# Patient Record
Sex: Female | Born: 1965 | Race: Black or African American | Hispanic: No | Marital: Married | Smoking: Never smoker
Health system: Southern US, Community
[De-identification: ages and names within clinical notes are randomized; demographics above are authoritative.]

## PROBLEM LIST (undated history)

## (undated) DIAGNOSIS — K449 Diaphragmatic hernia without obstruction or gangrene: Secondary | ICD-10-CM

## (undated) DIAGNOSIS — I729 Aneurysm of unspecified site: Secondary | ICD-10-CM

## (undated) DIAGNOSIS — I10 Essential (primary) hypertension: Secondary | ICD-10-CM

## (undated) DIAGNOSIS — G4730 Sleep apnea, unspecified: Secondary | ICD-10-CM

## (undated) DIAGNOSIS — K219 Gastro-esophageal reflux disease without esophagitis: Secondary | ICD-10-CM

## (undated) DIAGNOSIS — B977 Papillomavirus as the cause of diseases classified elsewhere: Secondary | ICD-10-CM

## (undated) DIAGNOSIS — N200 Calculus of kidney: Secondary | ICD-10-CM

## (undated) DIAGNOSIS — K589 Irritable bowel syndrome without diarrhea: Secondary | ICD-10-CM

## (undated) DIAGNOSIS — R7611 Nonspecific reaction to tuberculin skin test without active tuberculosis: Secondary | ICD-10-CM

## (undated) DIAGNOSIS — D649 Anemia, unspecified: Secondary | ICD-10-CM

## (undated) DIAGNOSIS — F419 Anxiety disorder, unspecified: Secondary | ICD-10-CM

## (undated) DIAGNOSIS — D573 Sickle-cell trait: Secondary | ICD-10-CM

## (undated) HISTORY — DX: Anxiety disorder, unspecified: F41.9

## (undated) HISTORY — DX: Gastro-esophageal reflux disease without esophagitis: K21.9

## (undated) HISTORY — DX: Irritable bowel syndrome, unspecified: K58.9

## (undated) HISTORY — DX: Nonspecific reaction to tuberculin skin test without active tuberculosis: R76.11

## (undated) HISTORY — DX: Calculus of kidney: N20.0

## (undated) HISTORY — PX: TUBAL LIGATION: SHX77

## (undated) HISTORY — DX: Papillomavirus as the cause of diseases classified elsewhere: B97.7

## (undated) HISTORY — DX: Essential (primary) hypertension: I10

## (undated) HISTORY — DX: Sleep apnea, unspecified: G47.30

## (undated) HISTORY — PX: PELVIC LAPAROSCOPY: SHX162

## (undated) HISTORY — DX: Aneurysm of unspecified site: I72.9

## (undated) HISTORY — DX: Anemia, unspecified: D64.9

## (undated) HISTORY — PX: BREAST SURGERY: SHX581

## (undated) HISTORY — PX: OTHER SURGICAL HISTORY: SHX169

## (undated) HISTORY — PX: APPENDECTOMY: SHX54

## (undated) HISTORY — DX: Sickle-cell trait: D57.3

## (undated) HISTORY — PX: TONSILLECTOMY AND ADENOIDECTOMY: SUR1326

---

## 1998-11-14 ENCOUNTER — Emergency Department (HOSPITAL_COMMUNITY): Admission: EM | Admit: 1998-11-14 | Discharge: 1998-11-14 | Payer: Self-pay | Admitting: Emergency Medicine

## 1999-06-18 ENCOUNTER — Other Ambulatory Visit: Admission: RE | Admit: 1999-06-18 | Discharge: 1999-06-18 | Payer: Self-pay | Admitting: *Deleted

## 1999-07-09 ENCOUNTER — Encounter (INDEPENDENT_AMBULATORY_CARE_PROVIDER_SITE_OTHER): Payer: Self-pay | Admitting: Specialist

## 1999-07-09 ENCOUNTER — Other Ambulatory Visit: Admission: RE | Admit: 1999-07-09 | Discharge: 1999-07-09 | Payer: Self-pay | Admitting: *Deleted

## 1999-08-07 ENCOUNTER — Emergency Department (HOSPITAL_COMMUNITY): Admission: EM | Admit: 1999-08-07 | Discharge: 1999-08-07 | Payer: Self-pay | Admitting: Emergency Medicine

## 1999-08-07 ENCOUNTER — Encounter: Payer: Self-pay | Admitting: Emergency Medicine

## 1999-08-13 ENCOUNTER — Ambulatory Visit (HOSPITAL_COMMUNITY): Admission: RE | Admit: 1999-08-13 | Discharge: 1999-08-13 | Payer: Self-pay | Admitting: *Deleted

## 1999-08-13 ENCOUNTER — Encounter (INDEPENDENT_AMBULATORY_CARE_PROVIDER_SITE_OTHER): Payer: Self-pay

## 1999-08-14 ENCOUNTER — Inpatient Hospital Stay (HOSPITAL_COMMUNITY): Admission: AD | Admit: 1999-08-14 | Discharge: 1999-08-14 | Payer: Self-pay | Admitting: Obstetrics and Gynecology

## 1999-12-30 ENCOUNTER — Emergency Department (HOSPITAL_COMMUNITY): Admission: EM | Admit: 1999-12-30 | Discharge: 1999-12-31 | Payer: Self-pay | Admitting: Emergency Medicine

## 1999-12-31 ENCOUNTER — Ambulatory Visit (HOSPITAL_COMMUNITY): Admission: RE | Admit: 1999-12-31 | Discharge: 1999-12-31 | Payer: Self-pay | Admitting: Gastroenterology

## 2000-01-17 ENCOUNTER — Other Ambulatory Visit: Admission: RE | Admit: 2000-01-17 | Discharge: 2000-01-17 | Payer: Self-pay | Admitting: *Deleted

## 2000-06-12 ENCOUNTER — Other Ambulatory Visit: Admission: RE | Admit: 2000-06-12 | Discharge: 2000-06-12 | Payer: Self-pay | Admitting: *Deleted

## 2000-12-29 ENCOUNTER — Other Ambulatory Visit: Admission: RE | Admit: 2000-12-29 | Discharge: 2000-12-29 | Payer: Self-pay | Admitting: *Deleted

## 2001-06-20 ENCOUNTER — Other Ambulatory Visit: Admission: RE | Admit: 2001-06-20 | Discharge: 2001-06-20 | Payer: Self-pay | Admitting: *Deleted

## 2001-09-28 ENCOUNTER — Inpatient Hospital Stay (HOSPITAL_COMMUNITY): Admission: AD | Admit: 2001-09-28 | Discharge: 2001-09-28 | Payer: Self-pay | Admitting: *Deleted

## 2001-11-14 ENCOUNTER — Observation Stay (HOSPITAL_COMMUNITY): Admission: AD | Admit: 2001-11-14 | Discharge: 2001-11-14 | Payer: Self-pay | Admitting: *Deleted

## 2001-11-14 ENCOUNTER — Encounter: Payer: Self-pay | Admitting: *Deleted

## 2001-11-26 ENCOUNTER — Inpatient Hospital Stay (HOSPITAL_COMMUNITY): Admission: AD | Admit: 2001-11-26 | Discharge: 2001-11-26 | Payer: Self-pay | Admitting: *Deleted

## 2001-11-26 ENCOUNTER — Encounter: Payer: Self-pay | Admitting: *Deleted

## 2001-12-05 ENCOUNTER — Encounter: Payer: Self-pay | Admitting: *Deleted

## 2001-12-05 ENCOUNTER — Ambulatory Visit (HOSPITAL_COMMUNITY): Admission: AD | Admit: 2001-12-05 | Discharge: 2001-12-05 | Payer: Self-pay | Admitting: *Deleted

## 2001-12-13 ENCOUNTER — Inpatient Hospital Stay (HOSPITAL_COMMUNITY): Admission: AD | Admit: 2001-12-13 | Discharge: 2001-12-13 | Payer: Self-pay | Admitting: *Deleted

## 2001-12-14 ENCOUNTER — Inpatient Hospital Stay (HOSPITAL_COMMUNITY): Admission: AD | Admit: 2001-12-14 | Discharge: 2001-12-14 | Payer: Self-pay | Admitting: *Deleted

## 2001-12-24 ENCOUNTER — Inpatient Hospital Stay (HOSPITAL_COMMUNITY): Admission: AD | Admit: 2001-12-24 | Discharge: 2001-12-24 | Payer: Self-pay | Admitting: *Deleted

## 2002-01-03 ENCOUNTER — Encounter: Payer: Self-pay | Admitting: *Deleted

## 2002-01-03 ENCOUNTER — Ambulatory Visit (HOSPITAL_COMMUNITY): Admission: RE | Admit: 2002-01-03 | Discharge: 2002-01-03 | Payer: Self-pay | Admitting: *Deleted

## 2002-01-04 ENCOUNTER — Inpatient Hospital Stay (HOSPITAL_COMMUNITY): Admission: AD | Admit: 2002-01-04 | Discharge: 2002-01-07 | Payer: Self-pay | Admitting: *Deleted

## 2002-01-04 ENCOUNTER — Encounter (INDEPENDENT_AMBULATORY_CARE_PROVIDER_SITE_OTHER): Payer: Self-pay | Admitting: Specialist

## 2002-02-18 ENCOUNTER — Other Ambulatory Visit: Admission: RE | Admit: 2002-02-18 | Discharge: 2002-02-18 | Payer: Self-pay | Admitting: *Deleted

## 2003-04-22 ENCOUNTER — Other Ambulatory Visit: Admission: RE | Admit: 2003-04-22 | Discharge: 2003-04-22 | Payer: Self-pay | Admitting: Family Medicine

## 2003-08-08 ENCOUNTER — Encounter: Admission: RE | Admit: 2003-08-08 | Discharge: 2003-08-08 | Payer: Self-pay | Admitting: Family Medicine

## 2004-04-01 ENCOUNTER — Ambulatory Visit: Payer: Self-pay | Admitting: Family Medicine

## 2004-12-09 ENCOUNTER — Ambulatory Visit: Payer: Self-pay | Admitting: Family Medicine

## 2004-12-17 ENCOUNTER — Other Ambulatory Visit: Admission: RE | Admit: 2004-12-17 | Discharge: 2004-12-17 | Payer: Self-pay | Admitting: Family Medicine

## 2004-12-17 ENCOUNTER — Ambulatory Visit: Payer: Self-pay | Admitting: Family Medicine

## 2004-12-21 ENCOUNTER — Encounter: Admission: RE | Admit: 2004-12-21 | Discharge: 2004-12-21 | Payer: Self-pay | Admitting: Family Medicine

## 2005-01-06 ENCOUNTER — Encounter: Admission: RE | Admit: 2005-01-06 | Discharge: 2005-01-06 | Payer: Self-pay | Admitting: Family Medicine

## 2005-11-08 ENCOUNTER — Ambulatory Visit: Payer: Self-pay | Admitting: Internal Medicine

## 2005-11-23 ENCOUNTER — Ambulatory Visit: Payer: Self-pay | Admitting: Family Medicine

## 2006-01-24 ENCOUNTER — Ambulatory Visit: Payer: Self-pay | Admitting: Family Medicine

## 2006-01-26 ENCOUNTER — Ambulatory Visit: Payer: Self-pay

## 2006-04-04 HISTORY — PX: CYSTOSCOPY: SUR368

## 2006-06-07 ENCOUNTER — Other Ambulatory Visit: Admission: RE | Admit: 2006-06-07 | Discharge: 2006-06-07 | Payer: Self-pay | Admitting: Family Medicine

## 2006-06-07 ENCOUNTER — Encounter: Payer: Self-pay | Admitting: Family Medicine

## 2006-06-07 ENCOUNTER — Ambulatory Visit: Payer: Self-pay | Admitting: Family Medicine

## 2006-06-26 ENCOUNTER — Encounter: Admission: RE | Admit: 2006-06-26 | Discharge: 2006-06-26 | Payer: Self-pay | Admitting: *Deleted

## 2006-08-07 ENCOUNTER — Encounter (INDEPENDENT_AMBULATORY_CARE_PROVIDER_SITE_OTHER): Payer: Self-pay | Admitting: Internal Medicine

## 2006-08-07 LAB — CONVERTED CEMR LAB: Pap Smear: NORMAL

## 2006-10-04 ENCOUNTER — Ambulatory Visit: Payer: Self-pay | Admitting: Family Medicine

## 2006-10-04 LAB — CONVERTED CEMR LAB
Bilirubin Urine: NEGATIVE
Glucose, Urine, Semiquant: NEGATIVE
Ketones, urine, test strip: NEGATIVE
pH: 6

## 2006-12-12 ENCOUNTER — Emergency Department (HOSPITAL_COMMUNITY): Admission: EM | Admit: 2006-12-12 | Discharge: 2006-12-12 | Payer: Self-pay | Admitting: Emergency Medicine

## 2006-12-15 ENCOUNTER — Encounter (INDEPENDENT_AMBULATORY_CARE_PROVIDER_SITE_OTHER): Payer: Self-pay | Admitting: Internal Medicine

## 2006-12-20 ENCOUNTER — Ambulatory Visit (HOSPITAL_BASED_OUTPATIENT_CLINIC_OR_DEPARTMENT_OTHER): Admission: RE | Admit: 2006-12-20 | Discharge: 2006-12-20 | Payer: Self-pay | Admitting: Urology

## 2007-06-29 ENCOUNTER — Telehealth (INDEPENDENT_AMBULATORY_CARE_PROVIDER_SITE_OTHER): Payer: Self-pay | Admitting: Internal Medicine

## 2007-07-10 ENCOUNTER — Encounter (INDEPENDENT_AMBULATORY_CARE_PROVIDER_SITE_OTHER): Payer: Self-pay | Admitting: Internal Medicine

## 2007-07-12 ENCOUNTER — Encounter (INDEPENDENT_AMBULATORY_CARE_PROVIDER_SITE_OTHER): Payer: Self-pay | Admitting: Internal Medicine

## 2007-07-12 ENCOUNTER — Other Ambulatory Visit: Admission: RE | Admit: 2007-07-12 | Discharge: 2007-07-12 | Payer: Self-pay | Admitting: Family Medicine

## 2007-07-12 ENCOUNTER — Ambulatory Visit: Payer: Self-pay | Admitting: Family Medicine

## 2007-07-12 DIAGNOSIS — D6489 Other specified anemias: Secondary | ICD-10-CM

## 2007-07-16 LAB — CONVERTED CEMR LAB
ALT: 16 units/L (ref 0–35)
AST: 18 units/L (ref 0–37)
Albumin: 4.1 g/dL (ref 3.5–5.2)
BUN: 10 mg/dL (ref 6–23)
Basophils Relative: 0.1 % (ref 0.0–1.0)
CO2: 27 meq/L (ref 19–32)
Calcium: 9.3 mg/dL (ref 8.4–10.5)
Creatinine, Ser: 0.9 mg/dL (ref 0.4–1.2)
GFR calc non Af Amer: 73 mL/min
HDL: 53.7 mg/dL (ref >39.0)
MCHC: 31.2 g/dL (ref 30.0–36.0)
Neutro Abs: 3.5 10*3/uL (ref 1.4–7.7)
Neutrophils Relative %: 56.1 % (ref 43.0–77.0)
RDW: 21.2 % — ABNORMAL HIGH (ref 11.5–14.6)
Sodium: 142 meq/L (ref 135–145)
Total CHOL/HDL Ratio: 3.1
WBC: 6.1 10*3/uL (ref 4.5–10.5)

## 2007-07-17 ENCOUNTER — Encounter (INDEPENDENT_AMBULATORY_CARE_PROVIDER_SITE_OTHER): Payer: Self-pay | Admitting: Internal Medicine

## 2007-07-17 LAB — CONVERTED CEMR LAB
ALT: 16 units/L (ref 0–35)
AST: 18 units/L (ref 0–37)
Albumin: 4.1 g/dL (ref 3.5–5.2)
Alkaline Phosphatase: 60 units/L (ref 39–117)
Bilirubin, Direct: 0.1 mg/dL (ref 0.0–0.3)
CO2: 27 meq/L (ref 19–32)
Calcium: 9.3 mg/dL (ref 8.4–10.5)
Creatinine, Ser: 0.9 mg/dL (ref 0.4–1.2)
Eosinophils Absolute: 0.1 10*3/uL (ref 0.0–0.7)
HCT: 32.3 % — ABNORMAL LOW (ref 36.0–46.0)
Iron: 16 ug/dL — ABNORMAL LOW (ref 42–145)
LDL Cholesterol: 107 mg/dL — ABNORMAL HIGH (ref 0–99)
Lymphocytes Relative: 35.1 % (ref 12.0–46.0)
Monocytes Absolute: 0.5 10*3/uL (ref 0.1–1.0)
Monocytes Relative: 6.9 % (ref 3.0–12.0)
Platelets: 413 10*3/uL — ABNORMAL HIGH (ref 150–400)
RDW: 21.2 % — ABNORMAL HIGH (ref 11.5–14.6)
Total Bilirubin: 0.6 mg/dL (ref 0.3–1.2)
Total CHOL/HDL Ratio: 3.1
Total Protein: 7.6 g/dL (ref 6.0–8.3)
Transferrin: 303.8 mg/dL (ref >=212.0)
Triglycerides: 37 mg/dL (ref 0–149)
WBC: 6.1 10*3/uL (ref 4.5–10.5)

## 2007-07-19 ENCOUNTER — Encounter (INDEPENDENT_AMBULATORY_CARE_PROVIDER_SITE_OTHER): Payer: Self-pay | Admitting: Internal Medicine

## 2007-07-19 ENCOUNTER — Encounter (INDEPENDENT_AMBULATORY_CARE_PROVIDER_SITE_OTHER): Payer: Self-pay | Admitting: *Deleted

## 2007-09-17 ENCOUNTER — Encounter (INDEPENDENT_AMBULATORY_CARE_PROVIDER_SITE_OTHER): Payer: Self-pay | Admitting: *Deleted

## 2008-01-28 ENCOUNTER — Encounter (INDEPENDENT_AMBULATORY_CARE_PROVIDER_SITE_OTHER): Payer: Self-pay | Admitting: Internal Medicine

## 2008-02-05 ENCOUNTER — Ambulatory Visit: Payer: Self-pay | Admitting: Family Medicine

## 2008-03-18 ENCOUNTER — Ambulatory Visit: Payer: Self-pay | Admitting: Family Medicine

## 2008-03-21 LAB — CONVERTED CEMR LAB
Eosinophils Relative: 1.9 % (ref 0.0–5.0)
HCT: 37.3 % (ref 36.0–46.0)
Hemoglobin: 12.4 g/dL (ref 12.0–15.0)
Lymphocytes Relative: 36.8 % (ref 12.0–46.0)
MCHC: 33.3 g/dL (ref 30.0–36.0)
MCV: 75.2 fL — ABNORMAL LOW (ref 78.0–100.0)
Monocytes Absolute: 0.6 10*3/uL (ref 0.1–1.0)
RDW: 22.8 % — ABNORMAL HIGH (ref 11.5–14.6)

## 2008-05-15 ENCOUNTER — Ambulatory Visit: Payer: Self-pay | Admitting: Family Medicine

## 2008-05-15 DIAGNOSIS — R1031 Right lower quadrant pain: Secondary | ICD-10-CM

## 2008-05-15 LAB — CONVERTED CEMR LAB
Basophils Absolute: 0 10*3/uL (ref 0.0–0.1)
Basophils Relative: 0.1 % (ref 0.0–3.0)
Eosinophils Relative: 1.4 % (ref 0.0–5.0)
HCT: 38.3 % (ref 36.0–46.0)
Hemoglobin: 13 g/dL (ref 12.0–15.0)
Monocytes Relative: 7.8 % (ref 3.0–12.0)
Neutrophils Relative %: 50.7 % (ref 43.0–77.0)

## 2008-05-16 ENCOUNTER — Encounter: Admission: RE | Admit: 2008-05-16 | Discharge: 2008-05-16 | Payer: Self-pay | Admitting: Family Medicine

## 2008-07-27 ENCOUNTER — Emergency Department (HOSPITAL_COMMUNITY): Admission: EM | Admit: 2008-07-27 | Discharge: 2008-07-27 | Payer: Self-pay | Admitting: Family Medicine

## 2008-08-20 ENCOUNTER — Encounter (INDEPENDENT_AMBULATORY_CARE_PROVIDER_SITE_OTHER): Payer: Self-pay | Admitting: Internal Medicine

## 2008-08-20 DIAGNOSIS — Z87442 Personal history of urinary calculi: Secondary | ICD-10-CM | POA: Insufficient documentation

## 2008-08-20 DIAGNOSIS — K219 Gastro-esophageal reflux disease without esophagitis: Secondary | ICD-10-CM

## 2008-08-21 ENCOUNTER — Encounter (INDEPENDENT_AMBULATORY_CARE_PROVIDER_SITE_OTHER): Payer: Self-pay | Admitting: Internal Medicine

## 2008-08-21 ENCOUNTER — Ambulatory Visit: Payer: Self-pay | Admitting: Family Medicine

## 2008-08-21 ENCOUNTER — Other Ambulatory Visit: Admission: RE | Admit: 2008-08-21 | Discharge: 2008-08-21 | Payer: Self-pay | Admitting: Family Medicine

## 2008-08-25 ENCOUNTER — Encounter: Admission: RE | Admit: 2008-08-25 | Discharge: 2008-08-25 | Payer: Self-pay | Admitting: Family Medicine

## 2008-08-26 ENCOUNTER — Encounter (INDEPENDENT_AMBULATORY_CARE_PROVIDER_SITE_OTHER): Payer: Self-pay | Admitting: Internal Medicine

## 2008-08-26 LAB — CONVERTED CEMR LAB
ALT: 11 units/L (ref 0–35)
AST: 15 units/L (ref 0–37)
Basophils Relative: 0.4 % (ref 0.0–3.0)
Chloride: 107 meq/L (ref 96–112)
Cholesterol: 173 mg/dL (ref 0–200)
Eosinophils Absolute: 0.1 10*3/uL (ref 0.0–0.7)
HDL: 47.5 mg/dL (ref >39.00)
Lymphocytes Relative: 40.7 % (ref 12.0–46.0)
Lymphs Abs: 2.4 10*3/uL (ref 0.7–4.0)
Monocytes Absolute: 0.5 10*3/uL (ref 0.1–1.0)
Monocytes Relative: 8 % (ref 3.0–12.0)
Neutro Abs: 2.8 10*3/uL (ref 1.4–7.7)
Neutrophils Relative %: 48.6 % (ref 43.0–77.0)
Potassium: 3.9 meq/L (ref 3.5–5.1)
RBC: 4.28 M/uL (ref 3.87–5.11)
Total CHOL/HDL Ratio: 4
Triglycerides: 63 mg/dL (ref 0.0–149.0)
VLDL: 12.6 mg/dL (ref 0.0–40.0)

## 2008-08-28 ENCOUNTER — Encounter: Payer: Self-pay | Admitting: Family Medicine

## 2008-08-28 ENCOUNTER — Encounter (INDEPENDENT_AMBULATORY_CARE_PROVIDER_SITE_OTHER): Payer: Self-pay | Admitting: Internal Medicine

## 2008-08-29 ENCOUNTER — Encounter (INDEPENDENT_AMBULATORY_CARE_PROVIDER_SITE_OTHER): Payer: Self-pay | Admitting: Internal Medicine

## 2008-09-19 ENCOUNTER — Encounter (INDEPENDENT_AMBULATORY_CARE_PROVIDER_SITE_OTHER): Payer: Self-pay | Admitting: Internal Medicine

## 2008-12-09 ENCOUNTER — Ambulatory Visit: Payer: Self-pay | Admitting: Family Medicine

## 2008-12-09 LAB — CONVERTED CEMR LAB
Bacteria, UA: 0
Beta hcg, urine, semiquantitative: NEGATIVE
Bilirubin Urine: NEGATIVE
Glucose, Urine, Semiquant: NEGATIVE
Ketones, urine, test strip: NEGATIVE
Nitrite: NEGATIVE
Specific Gravity, Urine: 1.015
WBC Urine, dipstick: NEGATIVE
pH: 6

## 2008-12-31 ENCOUNTER — Telehealth (INDEPENDENT_AMBULATORY_CARE_PROVIDER_SITE_OTHER): Payer: Self-pay | Admitting: Internal Medicine

## 2009-01-02 ENCOUNTER — Ambulatory Visit: Payer: Self-pay | Admitting: Family Medicine

## 2009-01-02 LAB — CONVERTED CEMR LAB
HCT: 30.6 % — ABNORMAL LOW (ref 36.0–46.0)
Lymphs Abs: 2.3 10*3/uL (ref 0.7–4.0)
Monocytes Relative: 8.1 % (ref 3.0–12.0)
Platelets: 426 10*3/uL — ABNORMAL HIGH (ref 150.0–400.0)
RDW: 18.3 % — ABNORMAL HIGH (ref 11.5–14.6)

## 2009-01-07 ENCOUNTER — Ambulatory Visit: Payer: Self-pay | Admitting: Family Medicine

## 2009-01-07 ENCOUNTER — Telehealth (INDEPENDENT_AMBULATORY_CARE_PROVIDER_SITE_OTHER): Payer: Self-pay | Admitting: Internal Medicine

## 2009-01-07 DIAGNOSIS — R110 Nausea: Secondary | ICD-10-CM

## 2009-01-07 LAB — CONVERTED CEMR LAB
Bacteria, UA: 0
Bilirubin Urine: NEGATIVE
Glucose, Urine, Semiquant: NEGATIVE
Ketones, urine, test strip: NEGATIVE
RBC / HPF: 0
Specific Gravity, Urine: 1.015
pH: 6.5

## 2009-02-11 ENCOUNTER — Ambulatory Visit: Payer: Self-pay | Admitting: Family Medicine

## 2009-02-11 LAB — CONVERTED CEMR LAB
Glucose, Urine, Semiquant: NEGATIVE
KOH Prep: NEGATIVE
Ketones, urine, test strip: NEGATIVE
Specific Gravity, Urine: 1.01
WBC, UA: 0 cells/hpf

## 2009-03-11 ENCOUNTER — Telehealth (INDEPENDENT_AMBULATORY_CARE_PROVIDER_SITE_OTHER): Payer: Self-pay | Admitting: Internal Medicine

## 2009-03-20 ENCOUNTER — Ambulatory Visit: Payer: Self-pay | Admitting: Family Medicine

## 2009-06-11 ENCOUNTER — Ambulatory Visit: Payer: Self-pay | Admitting: Family Medicine

## 2009-06-15 LAB — CONVERTED CEMR LAB
Basophils Absolute: 0.4 10*3/uL — ABNORMAL HIGH (ref 0.0–0.1)
Basophils Relative: 4.1 % — ABNORMAL HIGH (ref 0.0–3.0)
Eosinophils Absolute: 0.1 10*3/uL (ref 0.0–0.7)
HCT: 34.4 % — ABNORMAL LOW (ref 36.0–46.0)
Hemoglobin: 11.4 g/dL — ABNORMAL LOW (ref 12.0–15.0)
Iron: 25 ug/dL — ABNORMAL LOW (ref 42–145)
MCHC: 33.2 g/dL (ref 30.0–36.0)
MCV: 78.9 fL (ref 78.0–100.0)
Monocytes Relative: 7.9 % (ref 3.0–12.0)
RBC: 4.37 M/uL (ref 3.87–5.11)
RDW: 15.4 % — ABNORMAL HIGH (ref 11.5–14.6)
Transferrin: 241.1 mg/dL (ref 212.0–360.0)

## 2009-06-19 ENCOUNTER — Ambulatory Visit: Payer: Self-pay | Admitting: Family Medicine

## 2009-06-19 DIAGNOSIS — J019 Acute sinusitis, unspecified: Secondary | ICD-10-CM

## 2009-06-25 ENCOUNTER — Telehealth: Payer: Self-pay | Admitting: Family Medicine

## 2009-07-27 ENCOUNTER — Emergency Department (HOSPITAL_COMMUNITY): Admission: EM | Admit: 2009-07-27 | Discharge: 2009-07-27 | Payer: Self-pay | Admitting: Family Medicine

## 2009-09-15 ENCOUNTER — Ambulatory Visit: Payer: Self-pay | Admitting: Family Medicine

## 2009-09-16 LAB — CONVERTED CEMR LAB
ALT: 17 units/L (ref 0–35)
AST: 19 units/L (ref 0–37)
Alkaline Phosphatase: 51 units/L (ref 39–117)
Basophils Absolute: 0.1 10*3/uL (ref 0.0–0.1)
CO2: 24 meq/L (ref 19–32)
Calcium: 8.9 mg/dL (ref 8.4–10.5)
Creatinine, Ser: 0.8 mg/dL (ref 0.4–1.2)
Eosinophils Relative: 3.1 % (ref 0.0–5.0)
Ferritin: 3.3 ng/mL — ABNORMAL LOW (ref 10.0–291.0)
Glucose, Bld: 94 mg/dL (ref 70–99)
HCT: 33.9 % — ABNORMAL LOW (ref 36.0–46.0)
Iron: 16 ug/dL — ABNORMAL LOW (ref 42–145)
Lymphs Abs: 2.5 10*3/uL (ref 0.7–4.0)
MCV: 76.8 fL — ABNORMAL LOW (ref 78.0–100.0)
Monocytes Absolute: 0.4 10*3/uL (ref 0.1–1.0)
Neutrophils Relative %: 48.7 % (ref 43.0–77.0)
RBC: 4.42 M/uL (ref 3.87–5.11)
RDW: 16.9 % — ABNORMAL HIGH (ref 11.5–14.6)
Saturation Ratios: 3.9 % — ABNORMAL LOW (ref 20.0–50.0)
Total Bilirubin: 0.1 mg/dL — ABNORMAL LOW (ref 0.3–1.2)
Total CHOL/HDL Ratio: 4
Transferrin: 291.2 mg/dL (ref 212.0–360.0)
VLDL: 12.8 mg/dL (ref 0.0–40.0)
WBC: 6.2 10*3/uL (ref 4.5–10.5)

## 2009-09-17 ENCOUNTER — Ambulatory Visit: Payer: Self-pay | Admitting: Family Medicine

## 2009-09-17 ENCOUNTER — Other Ambulatory Visit: Admission: RE | Admit: 2009-09-17 | Discharge: 2009-09-17 | Payer: Self-pay | Admitting: Family Medicine

## 2009-09-18 ENCOUNTER — Encounter: Admission: RE | Admit: 2009-09-18 | Discharge: 2009-09-18 | Payer: Self-pay | Admitting: Family Medicine

## 2009-09-23 ENCOUNTER — Encounter (INDEPENDENT_AMBULATORY_CARE_PROVIDER_SITE_OTHER): Payer: Self-pay | Admitting: *Deleted

## 2009-09-25 ENCOUNTER — Encounter (INDEPENDENT_AMBULATORY_CARE_PROVIDER_SITE_OTHER): Payer: Self-pay | Admitting: *Deleted

## 2009-10-19 ENCOUNTER — Emergency Department (HOSPITAL_COMMUNITY): Admission: EM | Admit: 2009-10-19 | Discharge: 2009-10-19 | Payer: Self-pay | Admitting: Family Medicine

## 2009-10-20 ENCOUNTER — Ambulatory Visit: Payer: Self-pay | Admitting: Family Medicine

## 2009-11-18 ENCOUNTER — Ambulatory Visit: Payer: Self-pay | Admitting: Family Medicine

## 2009-11-23 LAB — CONVERTED CEMR LAB
Basophils Absolute: 0 10*3/uL (ref 0.0–0.1)
Hemoglobin: 12.1 g/dL (ref 12.0–15.0)
Lymphocytes Relative: 36.1 % (ref 12.0–46.0)
MCHC: 33.2 g/dL (ref 30.0–36.0)
Monocytes Absolute: 0.7 10*3/uL (ref 0.1–1.0)
Neutrophils Relative %: 52.2 % (ref 43.0–77.0)
Platelets: 384 10*3/uL (ref 150.0–400.0)
RDW: 19.9 % — ABNORMAL HIGH (ref 11.5–14.6)
WBC: 7.8 10*3/uL (ref 4.5–10.5)

## 2010-01-07 ENCOUNTER — Ambulatory Visit: Payer: Self-pay | Admitting: Internal Medicine

## 2010-01-07 DIAGNOSIS — R21 Rash and other nonspecific skin eruption: Secondary | ICD-10-CM | POA: Insufficient documentation

## 2010-02-03 ENCOUNTER — Ambulatory Visit: Payer: Self-pay | Admitting: Family Medicine

## 2010-02-03 LAB — CONVERTED CEMR LAB
Bilirubin Urine: NEGATIVE
Glucose, Urine, Semiquant: NEGATIVE
KOH Prep: NEGATIVE
Ketones, urine, test strip: NEGATIVE
Nitrite: NEGATIVE
Urine crystals, microscopic: 0 /hpf
Urobilinogen, UA: 0.2
Yeast, UA: 0
pH: 5

## 2010-05-03 ENCOUNTER — Telehealth: Payer: Self-pay | Admitting: Family Medicine

## 2010-05-06 ENCOUNTER — Other Ambulatory Visit (INDEPENDENT_AMBULATORY_CARE_PROVIDER_SITE_OTHER): Payer: 59

## 2010-05-06 ENCOUNTER — Other Ambulatory Visit: Payer: Self-pay | Admitting: Family Medicine

## 2010-05-06 ENCOUNTER — Encounter (INDEPENDENT_AMBULATORY_CARE_PROVIDER_SITE_OTHER): Payer: Self-pay | Admitting: *Deleted

## 2010-05-06 DIAGNOSIS — D6489 Other specified anemias: Secondary | ICD-10-CM

## 2010-05-06 LAB — CBC WITH DIFFERENTIAL/PLATELET
Eosinophils Absolute: 0.2 10*3/uL (ref 0.0–0.7)
Eosinophils Relative: 2.1 % (ref 0.0–5.0)
HCT: 35.9 % — ABNORMAL LOW (ref 36.0–46.0)
Lymphocytes Relative: 38 % (ref 12.0–46.0)
MCHC: 33.6 g/dL (ref 30.0–36.0)
Monocytes Relative: 6.4 % (ref 3.0–12.0)
Neutro Abs: 3.8 10*3/uL (ref 1.4–7.7)
Neutrophils Relative %: 53.2 % (ref 43.0–77.0)
Platelets: 344 10*3/uL (ref 150.0–400.0)
RBC: 4.49 Mil/uL (ref 3.87–5.11)

## 2010-05-06 NOTE — Miscellaneous (Signed)
Summary: pap results  Clinical Lists Changes  Observations: Added new observation of PAP SMEAR: normal (09/25/2009 15:18)      Preventive Care Screening  Pap Smear:    Date:  09/25/2009    Results:  normal

## 2010-05-06 NOTE — Assessment & Plan Note (Signed)
Summary: RASH   Vital Signs:  Patient profile:   45 year old female Weight:      150.25 pounds Temp:     98.3 degrees F oral Pulse rate:   72 / minute Pulse rhythm:   regular BP sitting:   120 / 64  (left arm) Cuff size:   regular  Vitals Entered By: Selena Batten Dance CMA Duncan Dull) (January 07, 2010 3:53 PM) CC: Rash under right arm x3 weeks   History of Present Illness: CC: rash  3wk h/o rash under right arm, pruritic.  Figured was just deodorant - suave.  Has tried lotions/ointment to help itch.  Did use new body wash a while back.  No fevers/chills, oral lesions, joint pain, muscle pain, abd pain, n/v, HA, vision changes.  no h/o eczema or asthma.  + allergic rhinitis  Current Medications (verified): 1)  Balanced Care   Tabs (Multiple Vitamins-Minerals) .... Take 1 Tablet By Mouth Once A Day 2)  Ibuprofen 200 Mg Tabs (Ibuprofen) .... Otc As Directed. 3)  Hemocyte 324 Mg Tabs (Ferrous Fumarate) .... One By Mouth Two Times A Day 4)  Delsym 30 Mg/75ml Lqcr (Dextromethorphan Polistirex) .... Otc As Directed. 5)  Proair Hfa 108 (90 Base) Mcg/act Aers (Albuterol Sulfate) .... Take 2 Puffs Every 4hrs As Needed Chest Tightness 6)  Loestrin Fe 1/20 1-20 Mg-Mcg Tabs (Norethin Ace-Eth Estrad-Fe) .Marland Kitchen.. 1 By Mouth Once Daily As Directed 7)  Claritin 10 Mg Tabs (Loratadine) .... Take 1 Tablet By Mouth Once A Day  Allergies: 1)  ! Asa 2)  ! 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  Past Surgical History: Last updated: 08/20/2008 Removal of breast mass between the ages of [age]/ benign Cesarean sections X 3 Bilateral tubal ligation Tonsillectomy and adenoidectomy at age 70 Appendectomy 6   Social History: Last updated: 09/17/2009 Marital Status: Married Children: 3 Occupation:  works at [employer] non smoker is decreasing caffiene - does have some energy drinks  PMH-FH-SH reviewed for relevance  Review of Systems       per HPI  Physical Exam  General:  Well-developed,well-nourished,in no acute distress; alert,appropriate and cooperative throughout examination Skin:  under right axilla patch of pruritic confluent hyperpigmented papules.   Impression & Recommendations:  Problem # 1:  SKIN RASH (ICD-782.1) possible eczema.  treat as such with steroids.  RTC if needed.  Discussed medication use and symptom control.   Her updated medication list for this problem includes:    Triamcinolone Acetonide 0.1 % Crea (Triamcinolone acetonide) .Marland Kitchen... Apply to aa under r arm two times a day x 2 weeks  Complete Medication List: 1)  Balanced Care Tabs (Multiple vitamins-minerals) .... Take 1 tablet by mouth once a day 2)  Ibuprofen 200 Mg Tabs (Ibuprofen) .... Otc as directed. 3)  Hemocyte 324 Mg Tabs (Ferrous fumarate) .... One by mouth two times a day 4)  Delsym 30 Mg/9ml Lqcr (Dextromethorphan polistirex) .... Otc as directed. 5)  Proair Hfa 108 (90 Base) Mcg/act Aers (Albuterol sulfate) .... Take 2 puffs every 4hrs as needed chest tightness 6)  Loestrin Fe 1/20 1-20 Mg-mcg Tabs (Norethin ace-eth estrad-fe) .Marland Kitchen.. 1 by mouth once daily as directed 7)  Claritin 10 Mg Tabs (Loratadine) .... Take 1 tablet by mouth once a day  8)  Triamcinolone Acetonide 0.1 % Crea (Triamcinolone acetonide) .... Apply to aa under r arm two times a day x 2 weeks  Patient Instructions: 1)  I'm unsure what that rash is, looks like possible eczema so we'll treat with course of steroids for 2 wks. 2)  D/C deodorant for now, could use baking soda on that side.   3)  Let us know how it's doing. 4)  Pleasure to meet you today. call clinic with quesitons. Prescriptions: TRIAMCINOLONE ACETONIDE 0.1 % CREA (TRIAMCINOLONE ACETONIDE) apply to aa under R arm two times a day x 2 weeks  #1 x 0    Entered and Authorized by:   Eustaquio Boyden  MD   Signed by:   Eustaquio Boyden  MD on 01/07/2010   Method used:   Electronically to        [name] [id number] (retail)       [address] [place], [place]  [address]       Ph: [phone]       Fax: [phone]   RxID:   [id number]   Current Allergies (reviewed today): ! ASA ! MORPHINE

## 2010-05-06 NOTE — [facility] Notes (Signed)
Summary: requests hycodan  Phone Note Call from Patient Call back at [phone]   Caller: Patient Call For: Dr. Dayton Martes Summary of Call: Pt was seen last week for a cough and given cheratussin.  She says this is not helping, cough is keeping her up at night.  She is asking for hycodan to be called to [facility].  States Willaim Sheng gave this to her at one time and it helped with cough and helped her sleep. Initial call taken by: Lowella Petties CMA,  June 25, 2009 10:07 AM  Follow-up for Phone Call        Medication phoned to pharmacy.  Follow-up by: Delilah Shan CMA Duncan Dull),  June 25, 2009 10:17 AM    New/Updated Medications: HYDROCODONE-HOMATROPINE 5-1.5 MG/5ML SYRP (HYDROCODONE-HOMATROPINE) 5 ml by mouth at bedtime as needed cough Prescriptions: HYDROCODONE-HOMATROPINE 5-1.5 MG/5ML SYRP (HYDROCODONE-HOMATROPINE) 5 ml by mouth at bedtime as needed cough  #4 ounces x 0   Entered and Authorized by:   Ruthe Mannan MD   Signed by:   Delilah Shan CMA (AAMA) on 06/25/2009   Method used:   Handwritten   RxID:   [id number]

## 2010-05-06 NOTE — Assessment & Plan Note (Signed)
Summary: CPX/DLO   Vital Signs:  Patient profile:   45 year old female Height:      62.75 inches Weight:      147.25 pounds BMI:     26.39 Temp:     98.5 degrees F oral Pulse rate:   76 / minute Pulse rhythm:   regular BP sitting:   114 / 70  (left arm) Cuff size:   regular  Vitals Entered By: Lewanda Rife LPN (September 17, 2009 10:34 AM) CC: CPX with pap and breast exam LMP 09/10/09   History of Present Illness: here for PE with pap   wt is stable good bp   hx of fibroid uterus with heavy menses and anemia  is iron def and also  trait last hb not bad at 11.5 but ferritin low at 3.3 and iron level is 16 taking hemocyte daily -once  energy level goes up and down -- at times is sluggish and tired   had BTL put on loestrin for menses  this is starting to help -- but still heavy at least one day  no pain with menses   pap nl 5/10 wants to get that done  last abn pap was years ago - did have hpv    mam neg 5/10- needs to set that up  self exam  tetnus shot 09   lipids ok with trig 64, HDL 46 and LDL 109   other labs ok   Allergies: 1)  ! Asa 2)  ! Morphine  Past History:  Past Surgical History: Last updated: 2008/09/10 Removal of breast mass between the ages of [age]/ benign Cesarean sections X 3 Bilateral tubal ligation Tonsillectomy and adenoidectomy at age 61 Appendectomy 6   Family History: Last updated: 2008-09-10 Father: Died at age 54 of a ruptured cerebral aneurysm Mother: Alive, with benign breast mass Siblings: 2 brothers alive and well, 3 sisters alive and well, one with fibrocystic breast disease Maternal GM + has had a stroke Diabestes- none Colon or prostate cancer - none Maternal aunt + breast cancer Paternal aunt + uterine cancer No family history of depression, nor alcohol or drug abuse Paternal aunt and cousin, maternal cousin + lupus  Social History: Last updated: 09/17/2009 Marital Status: Married Children: 3 Occupation: works  at [employer] non smoker is decreasing caffiene - does have some energy drinks   Risk Factors: Alcohol Use: 0 (08/21/2008) Caffeine Use: 2 (08/21/2008) Exercise: no (08/21/2008)  Risk Factors: Smoking Status: never (08/21/2008)  Past Medical History: GERD iron def anemia sickle cell trait  fibroid uterus with painful menses menorrhagia  alll rhiniits hx of kidney stone hx of pos PPD  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 Eyes:  Denies blurring and eye irritation. CV:  Denies chest pain or discomfort, lightheadness, palpitations, and shortness of breath with exertion. Resp:  Denies cough and wheezing. GI:  Denies abdominal pain, change in bowel habits, indigestion, and nausea. GU:  Denies discharge, dysuria, and urinary frequency. MS:  Denies joint pain, joint redness, and joint swelling. Derm:  Denies itching, lesion(s), poor wound healing, and rash. Neuro:  Denies numbness and tingling. Psych:  Denies anxiety and depression. Endo:  Denies cold intolerance, excessive thirst, excessive urination, and heat intolerance. Heme:  Denies abnormal bruising and bleeding.  Physical Exam  General:  Well-developed,well-nourished,in no acute distress; alert,appropriate and cooperative throughout examination Head:  normocephalic, atraumatic, and no abnormalities observed.   Eyes:  vision grossly intact, pupils equal, pupils round, and pupils reactive to light.  very mild conj pallor  Ears:  R ear normal and L ear normal.   Nose:  nares are boggy but clear  Mouth:  pharynx pink and moist, no erythema, and no exudates.   Neck:  supple with full rom and no masses or thyromegally, no JVD or carotid bruit  Chest Wall:  No deformities, masses, or tenderness  noted. Breasts:  No mass, nodules, thickening, tenderness, bulging, retraction, inflamation, nipple discharge or skin changes noted.   Lungs:  Normal respiratory effort, chest expands symmetrically. Lungs are clear to auscultation, no crackles or wheezes. Heart:  normal rate, regular rhythm, and no murmur.   Abdomen:  Bowel sounds positive,abdomen soft and non-tender without masses, organomegaly or hernias noted. no renal bruits  Genitalia:  normal introitus, no external lesions, no vaginal discharge, mucosa pink and moist, no vaginal or cervical lesions, no vaginal atrophy, no friaility or hemorrhage, and no adnexal masses or tenderness.   enlarged/ fibroid uterus noted which is nontender Msk:  No deformity or scoliosis noted of thoracic or lumbar spine.  no acute joint changes  Pulses:  R and L carotid,radial,femoral,dorsalis pedis and posterior tibial pulses are full and equal bilaterally Extremities:  No clubbing, cyanosis, edema, or deformity noted with normal full range of motion of all joints.   Neurologic:  sensation intact to light touch, gait normal, and DTRs symmetrical and normal.   Skin:  Intact without suspicious lesions or rashes Cervical Nodes:  No lymphadenopathy noted Axillary Nodes:  No palpable lymphadenopathy Inguinal Nodes:  No significant adenopathy Psych:  normal affect, talkative and pleasant    Impression & Recommendations:  Problem # 1:  HEALTH MAINTENANCE EXAM (ICD-V70.0) Assessment Comment Only reviewed health habits including diet, exercise and skin cancer prevention reviewed health maintenance list and family history rev labs in detail   Problem # 2:  ROUTINE GYNECOLOGICAL EXAMINATION (ICD-V72.31) Assessment: Comment Only annual exam with pap  remote hx of abn pap with hpv but last 3 were neg fibroids are moderate -- menses are starting to lighten with OC thankfully will continue that   Problem # 3:  OTHER SCREENING MAMMOGRAM (ICD-V76.12) Assessment:  Comment Only annual mammogram scheduled adv pt to continue regular self breast exams non remarkable breast exam today  Orders: Radiology Referral (Radiology)  Problem # 4:  ANEMIA, IRON DEFICIENCY (ICD-280.9) Assessment: Unchanged  likely from combination of heavy menses from fibroids (OC is helping now), and also sickle cell trait  suspect her intermittent fatigue is in part caused by anemia  will inc her hemocyte to two times a day and update after labs in 2 months  if ferritin/ iron level remain low - may need to re- eval (? perhaps iron abs problem)-- pt states this has been a problem all her life  Her updated medication list for this problem includes:    Hemocyte 324 Mg Tabs (Ferrous fumarate) ..... One by mouth two times a day  Hgb: 11.5 (09/15/2009)   Hct: 33.9 (09/15/2009)   Platelets: 400.0 (09/15/2009) RBC: 4.42 (09/15/2009)   RDW: 16.9 (09/15/2009)   WBC: 6.2 (09/15/2009) MCV: 76.8 (09/15/2009)   MCHC: 33.8 (09/15/2009) Ferritin: 3.3 (09/15/2009) Iron: 16 (09/15/2009)   % Sat: 3.9 (09/15/2009) Folate: 11.6 (07/12/2007)   TSH: 1.08 (09/15/2009)  Complete Medication List:  1)  Balanced Care Tabs (Multiple vitamins-minerals) .... Take 1 tablet by mouth once a day 2)  Ibuprofen 200 Mg Tabs (Ibuprofen) .... Otc as directed. 3)  Hemocyte 324 Mg Tabs (Ferrous fumarate) .... One by mouth two times a day 4)  Delsym 30 Mg/11ml Lqcr (Dextromethorphan polistirex) .... Otc as directed. 5)  Proair Hfa 108 (90 Base) Mcg/act Aers (Albuterol sulfate) .... Take 2 puffs every 4hrs as needed chest tightness 6)  Loestrin Fe 1/20 1-20 Mg-mcg Tabs (Norethin ace-eth estrad-fe) .Marland Kitchen.. 1 by mouth once daily as directed 7)  Claritin 10 Mg Tabs (Loratadine) .... Take 1 tablet by mouth once a day  Patient Instructions: 1)  increase your iron (hemacyte) to one pill two times a day  2)  re check cbc with diff and iron level / ferritin for anemia in 2 months please 3)  we will schedule mammogram at check  out  4)  I sent iron px to your pharmacy  5)  try to have healthy diet and exercise  Prescriptions: HEMOCYTE 324 MG TABS (FERROUS FUMARATE) one by mouth two times a day  #60 x 11   Entered and Authorized by:   Judith Part MD   Signed by:   Judith Part MD on 09/17/2009   Method used:   Electronically to        [facility] [id number] (retail)       [address] [place], [place]  [address]       Ph: [phone]       Fax: [phone]   RxID:   [id number]   Current Allergies (reviewed today): ! ASA ! MORPHINE

## 2010-05-06 NOTE — Letter (Signed)
Summary: Results Follow up Letter  [facility]  [address] [place], [place] [address]   Phone: [phone]  Fax: [phone]    09/23/2009 MRN: [id number]    [name] [address] [place], [place]  [address]    Dear Ms. Clint Lipps,  The following are the results of your recent test(s):  Test         Result    Pap Smear:        Normal _____  Not Normal _____ Comments: ______________________________________________________ Cholesterol: LDL(Bad cholesterol):         Your goal is less than:         HDL (Good cholesterol):       Your goal is more than: Comments:  ______________________________________________________ Mammogram:        Normal __X___  Not Normal _____ Comments:  Yearly follow up is recommended.   ___________________________________________________________________ Hemoccult:        Normal _____  Not normal _______ Comments:    _____________________________________________________________________ Other Tests:    We routinely do not discuss normal results over the telephone.  If you desire a copy of the results, or you have any questions about this information we can discuss them at your next office visit.   Sincerely,    Marne A. Milinda Antis, M.D.  MAT:lsf

## 2010-05-06 NOTE — Assessment & Plan Note (Signed)
Summary: ESTABH FROM [facility] IRON LEVEL LOW/DLO   Vital Signs:  Patient profile:   45 year old female Height:      62.75 inches Weight:      146.25 pounds BMI:     26.21 Temp:     98.3 degrees F oral Pulse rate:   72 / minute Pulse rhythm:   regular BP sitting:   124 / 70  (left arm) Cuff size:   regular  Vitals Entered By: Lewanda Rife LPN (June 11, 2009 8:37 AM)  History of Present Illness: is anemic with last hb of 9.7  has always been iron deficient all of her life from birth   low iron in past  on hemocyte 324 mg once daily-- for a while -- then stopped them  then started feeling bad and re started 2 d ago   menses -- are very heavy overall - ? if this is the cause  is worse the 2nd or 3rd day  not on OC now  had BTL  does have some fibroids as well    is a very picky eater  does not eat much during the day  is trying to eat fruit in the am  eats steak - 1-2 times per week  eats some green veggies   ? GI screen-- never had  no blood in stool or abd pain   no family hx of low iron   has sickle trait  no beta thal     Allergies: 1)  ! Asa 2)  ! Morphine  Past History:  Past Surgical History: Last updated: 09-17-2008 Removal of breast mass between the ages of [age]/ benign Cesarean sections X 3 Bilateral tubal ligation Tonsillectomy and adenoidectomy at age 89 Appendectomy 6   Family History: Last updated: 09-17-08 Father: Died at age 31 of a ruptured cerebral aneurysm Mother: Alive, with benign breast mass Siblings: 2 brothers alive and well, 3 sisters alive and well, one with fibrocystic breast disease Maternal GM + has had a stroke Diabestes- none Colon or prostate cancer - none Maternal aunt + breast cancer Paternal aunt + uterine cancer No family history of depression, nor alcohol or drug abuse Paternal aunt and cousin, maternal cousin + lupus  Social History: Last updated: 2008-09-17 Marital Status: Married Children: 3 Occupation:  works at [employer]  Risk Factors: Alcohol Use: 0 (08/21/2008) Caffeine Use: 2 (08/21/2008) Exercise: no (08/21/2008)  Risk Factors: Smoking Status: never (08/21/2008)  Past Medical History: GERD iron def anemia sickle cell trait  fibroid uterus with painful menses menorrhagia  alll rhiniits hx of kidney stone hx of pos PPD   Review of Systems General:  Complains of fatigue; denies fever, loss of appetite, and malaise. Eyes:  Denies blurring and discharge. CV:  Denies chest pain or discomfort and palpitations. Resp:  Denies cough and wheezing. GI:  Denies abdominal pain, bloody stools, change in bowel habits, dark tarry stools, diarrhea, indigestion, nausea, and vomiting. GU:  Complains of abnormal vaginal bleeding; denies hematuria and urinary frequency. Derm:  Denies poor wound healing and rash. Neuro:  Denies headaches and numbness. Psych:  Denies anxiety and depression. Endo:  Denies cold intolerance and heat intolerance. Heme:  Denies abnormal bruising, bleeding, enlarge lymph nodes, and skin discoloration.  Physical Exam  General:  Well-developed,well-nourished,in no acute distress; alert,appropriate and cooperative throughout examination Head:  normocephalic, atraumatic, and no abnormalities observed.   Eyes:  vision grossly intact, pupils equal, pupils round, and pupils reactive to light.  very  mild conj pallor  Mouth:  pharynx pink and moist.   Neck:  supple with full rom and no masses or thyromegally, no JVD or carotid bruit  Chest Wall:  No deformities, masses, or tenderness noted. Lungs:  Normal respiratory effort, chest expands symmetrically. Lungs are clear to auscultation, no crackles or wheezes. Heart:  normal rate, regular rhythm, and no murmur.   Abdomen:  Bowel sounds positive,abdomen soft and non-tender without masses, organomegaly or hernias noted. no suprapubic tenderness  Msk:  No deformity or scoliosis noted of thoracic or lumbar spine.  no  acute joint changes  Extremities:  No clubbing, cyanosis, edema, or deformity noted with normal full range of motion of all joints.   Neurologic:  sensation intact to light touch, gait normal, and DTRs symmetrical and normal.  no tremor  Skin:  Intact without suspicious lesions or rashes no pallor or jaundice  Cervical Nodes:  No lymphadenopathy noted Inguinal Nodes:  No significant adenopathy Psych:  normal affect, talkative and pleasant    Impression & Recommendations:  Problem # 1:  ANEMIA, IRON DEFICIENCY (ICD-280.9) Assessment Deteriorated  with hx of sickle cell trait (? what her baseline hb is ) and also heavy menses from fibroid urged to continue hemocyte and high iron diet  lab today  oc to help menses  f/u in june for PE and gyn as well  update if not imp energy level Her updated medication list for this problem includes:    Hemocyte 324 Mg Tabs (Ferrous fumarate) ..... One daily  Orders: Venipuncture (84696) TLB-CBC Platelet - w/Differential (85025-CBCD) TLB-IBC Pnl (Iron/FE;Transferrin) (83550-IBC) Prescription Created Electronically [phone])  Hgb: 9.7 (01/02/2009)   Hct: 30.6 (01/02/2009)   Platelets: 426.0 (01/02/2009) RBC: 4.18 (01/02/2009)   RDW: 18.3 H % (01/02/2009)   WBC: 5.7 (01/02/2009) MCV: 73.4 (01/02/2009)   MCHC: 31.6 (01/02/2009) Ferritin: 4.1 (07/12/2007) Iron: 16 (07/12/2007)   Folate: 11.6 (07/12/2007)   TSH: 0.80 (08/21/2008)  Problem # 2:  FIBROIDS, UTERUS (ICD-218.9) Assessment: Unchanged  with painful and heavy menses will try oc - loestrin fe - disc what to exp  warned no std prot and not to smoke  disc way to take  will update - if not imp menses in 3 mo ref to gyn to disc other opt for tx   Orders: Prescription Created Electronically [phone])  Problem # 3:  MENORRHAGIA (ICD-626.2) Assessment: New from uterine fibroids  will tx with oc and f/u gyn exam summer update if not imp  Her updated medication list for this problem  includes:    Loestrin Fe 1/20 1-20 Mg-mcg Tabs (Norethin ace-eth estrad-fe) .Marland Kitchen... 1 by mouth once daily as directed  Orders: Prescription Created Electronically [phone])  Complete Medication List: 1)  Balanced Care Tabs (Multiple vitamins-minerals) .... Take 1 tablet by mouth once a day 2)  Ibuprofen 200 Mg Tabs (Ibuprofen) .... Otc as directed. 3)  Hemocyte 324 Mg Tabs (Ferrous fumarate) .... One daily 4)  Delsym 30 Mg/62ml Lqcr (Dextromethorphan polistirex) .... Otc as directed. 5)  Proair Hfa 108 (90 Base) Mcg/act Aers (Albuterol sulfate) .... Take 2 puffs every 4hrs as needed chest tightness 6)  Clarinex 5 Mg Tabs (Desloratadine) .Marland Kitchen.. 1 daily fo r congestion 7)  Hycodan  .... Take 1-2 tsp at bedtime as needed cough 8)  Loestrin Fe 1/20 1-20 Mg-mcg Tabs (Norethin ace-eth estrad-fe) .Marland Kitchen.. 1 by mouth once daily as directed  Patient Instructions: 1)  continue your hemocyte (iron pill) once a day  2)  start  loestrin fe 1/20 daily -- take at the same time every day  3)  it will off set your period by 2 week -- (will take about 3 months to get in rhythm )  4)  if any side effects - let me know  5)  do not smoke on the pill  6)  labs today  7)  schedule f/u with me for PE and pap in june (any 30 min appt)  Prescriptions: LOESTRIN FE 1/20 1-20 MG-MCG TABS (NORETHIN ACE-ETH ESTRAD-FE) 1 by mouth once daily as directed  #1 pack x 11   Entered and Authorized by:   Judith Part MD   Signed by:   Judith Part MD on 06/11/2009   Method used:   Electronically to        [facility] [id number] (retail)       [address] [place], [place]  [address]       Ph: [phone]       Fax: [phone]   RxID:   [id number]   Current Allergies (reviewed today): ! ASA ! MORPHINE

## 2010-05-06 NOTE — Assessment & Plan Note (Signed)
Summary: COUGH,BACK PAIN,CONGESTION/CLE   Vital Signs:  Patient profile:   45 year old female Height:      62.75 inches Weight:      148.25 pounds BMI:     26.57 Temp:     98.3 degrees F oral Pulse rate:   88 / minute Pulse rhythm:   regular BP sitting:   112 / 58  (left arm) Cuff size:   regular  Vitals Entered By: Delilah Shan CMA Duncan Dull) (June 19, 2009 4:08 PM) CC: Cough, back pain, congestion   History of Present Illness: 45 yo with 1 week of worsening URI symptoms. STarted out with runny nose, dry cough. Sinus pressure. Now left ear very painful, popping. Sujbective fevers and chills. No sputum production, wheezing, or SOB.  Current Medications (verified): 1)  Balanced Care   Tabs (Multiple Vitamins-Minerals) .... Take 1 Tablet By Mouth Once A Day 2)  Ibuprofen 200 Mg Tabs (Ibuprofen) .... Otc As Directed. 3)  Hemocyte 324 Mg Tabs (Ferrous Fumarate) .... One Daily 4)  Delsym 30 Mg/34ml Lqcr (Dextromethorphan Polistirex) .... Otc As Directed. 5)  Proair Hfa 108 (90 Base) Mcg/act Aers (Albuterol Sulfate) .... Take 2 Puffs Every 4hrs As Needed Chest Tightness 6)  Loestrin Fe 1/20 1-20 Mg-Mcg Tabs (Norethin Ace-Eth Estrad-Fe) .Marland Kitchen.. 1 By Mouth Once Daily As Directed 7)  Claritin 10 Mg Tabs (Loratadine) .... Take 1 Tablet By Mouth Once A Day 8)  Amoxicillin 500 Mg Tabs (Amoxicillin) .Marland Kitchen.. 1 Tab By Mouth Two Times A Day X10 Days 9)  Cheratussin Ac 100-10 Mg/38ml Syrp (Guaifenesin-Codeine) .... 5 Ml At Bedtime As Needed Cough  Allergies: 1)  ! Asa 2)  ! Morphine  Review of Systems      See HPI General:  Complains of chills and fever. ENT:  Complains of earache, nasal congestion, and sinus pressure; denies ear discharge and sore throat. CV:  Denies chest pain or discomfort. Resp:  Complains of cough; denies shortness of breath, sputum productive, and wheezing.  Physical Exam  General:  Well-developed,well-nourished,in no acute distress; alert,appropriate and cooperative  throughout examination Ears:  TMs retracted bilaterally Nose:  nasal dischargemucosal pallor.   Mouth:  pharynx pink and moist.   Lungs:  Normal respiratory effort, chest expands symmetrically. Lungs are clear to auscultation, no crackles or wheezes. Heart:  normal rate, regular rhythm, and no murmur.   Psych:  normal affect, talkative and pleasant    Impression & Recommendations:  Problem # 1:  SINUSITIS, ACUTE (ICD-461.9) Assessment New Given duration and progression of symptoms, will treat with 10 day course of amoxicillin. See pt instructions. Her updated medication list for this problem includes:    Delsym 30 Mg/42ml Lqcr (Dextromethorphan polistirex) ..... Otc as directed.    Amoxicillin 500 Mg Tabs (Amoxicillin) .Marland Kitchen... 1 tab by mouth two times a day x10 days    Cheratussin Ac 100-10 Mg/18ml Syrp (Guaifenesin-codeine) .Marland KitchenMarland KitchenMarland KitchenMarland Kitchen 5 ml at bedtime as needed cough  Complete Medication List: 1)  Balanced Care Tabs (Multiple vitamins-minerals) .... Take 1 tablet by mouth once a day 2)  Ibuprofen 200 Mg Tabs (Ibuprofen) .... Otc as directed. 3)  Hemocyte 324 Mg Tabs (Ferrous fumarate) .... One daily 4)  Delsym 30 Mg/14ml Lqcr (Dextromethorphan polistirex) .... Otc as directed. 5)  Proair Hfa 108 (90 Base) Mcg/act Aers (Albuterol sulfate) .... Take 2 puffs every 4hrs as needed chest tightness 6)  Loestrin Fe 1/20 1-20 Mg-mcg Tabs (Norethin ace-eth estrad-fe) .Marland Kitchen.. 1 by mouth once daily as directed 7)  Claritin  10 Mg Tabs (Loratadine) .... Take 1 tablet by mouth once a day 8)  Amoxicillin 500 Mg Tabs (Amoxicillin) .Marland Kitchen.. 1 tab by mouth two times a day x10 days 9)  Cheratussin Ac 100-10 Mg/23ml Syrp (Guaifenesin-codeine) .... 5 ml at bedtime as needed cough  Patient Instructions: 1)  Take antibiotic as directed.  Drink lots of fluids.  Treat sympotmatically with Mucinex, nasal saline irrigation, and Tylenol/Ibuprofen. Also try claritin D or zyrtec D over the counter- two times a day as needed ( have  to sign for them at pharmacy). You can use warm compresses.  Cough suppressant at night. Call if not improving as expected in 5-7 days.  Prescriptions: CHERATUSSIN AC 100-10 MG/5ML SYRP (GUAIFENESIN-CODEINE) 5 ml at bedtime as needed cough  #4 ounces x 0   Entered and Authorized by:   Ruthe Mannan MD   Signed by:   Ruthe Mannan MD on 06/19/2009   Method used:   Print then Give to Patient   RxID:   [id number] AMOXICILLIN 500 MG TABS (AMOXICILLIN) 1 tab by mouth two times a day x10 days  #20 x 0   Entered and Authorized by:   Ruthe Mannan MD   Signed by:   Ruthe Mannan MD on 06/19/2009   Method used:   Electronically to        [facility] [id number] (retail)       [address] [place], [place]  [address]       Ph: [phone]       Fax: [phone]   RxID:   [id number]   Current Allergies (reviewed today): ! ASA ! MORPHINE

## 2010-05-06 NOTE — Assessment & Plan Note (Signed)
Summary: ?UTI/CLE   Vital Signs:  Patient profile:   45 year old female Height:      62.75 inches Weight:      153 pounds BMI:     27.42 Temp:     97.6 degrees F oral Pulse rate:   76 / minute Pulse rhythm:   regular BP sitting:   112 / 72  (left arm) Cuff size:   regular  Vitals Entered By: Lewanda Rife LPN (February 03, 2010 9:12 AM) CC: ?UTI burn and pain upon urination with frequency and low back pain   History of Present Illness: started symptoms 3 weeks ago  now getting worse  is burning and irritated to void  worse after intercourse  also slight odor- poss also vaginitis - slt d/c is light yellow in color   no worries about stds  some frequency - esp at night  low back is sore   some low abd cramping nl periods  on loestrin fe  no chance pregnant -- BTL in the past and also on OC   Allergies: 1)  ! Asa 2)  ! 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  Past Surgical History: Last updated: 2008-08-30 Removal of breast mass between the ages of [age]/ benign Cesarean sections X 3 Bilateral tubal ligation Tonsillectomy and adenoidectomy at age 46 Appendectomy 6   Family History: Last updated: 08-30-08 Father: Died at age 63 of a ruptured cerebral aneurysm Mother: Alive, with benign breast mass Siblings: 2 brothers alive and well, 3 sisters alive and well, one with fibrocystic breast disease Maternal GM + has had a stroke Diabestes- none Colon or prostate cancer - none Maternal aunt + breast cancer Paternal aunt + uterine cancer No family history of depression, nor alcohol or drug abuse Paternal aunt and cousin, maternal cousin + lupus  Social History: Last updated: 09/17/2009 Marital Status: Married Children: 3 Occupation: works at [employer] non smoker is decreasing caffiene - does have some energy drinks   Risk Factors: Alcohol Use: 0 (08/21/2008) Caffeine Use: 2 (08/21/2008) Exercise: no (08/21/2008)  Risk Factors: Smoking Status: never (08/21/2008)  Review of Systems General:  Denies chills, fatigue, fever, loss of appetite, and malaise. Eyes:  Denies blurring and discharge. ENT:  Denies sore throat. CV:  Denies chest pain or discomfort and palpitations. Resp:  Denies cough. GI:  Denies bloody stools, change in bowel habits, nausea, and vomiting. GU:  Complains of discharge, dysuria, nocturia, and urinary frequency; denies abnormal vaginal bleeding, genital sores, and urinary hesitancy. MS:  Complains of low back pain. Derm:  Complains of itching; denies rash. Heme:  Denies abnormal bruising and bleeding.  Physical Exam  General:  Well-developed,well-nourished,in no acute distress; alert,appropriate and cooperative throughout examination Head:  normocephalic, atraumatic, and no abnormalities observed.   Eyes:  vision grossly intact, pupils equal, pupils round, and pupils reactive to light.   Mouth:  pharynx pink and moist.   Neck:  supple with full rom and no masses or thyromegally, no JVD or carotid bruit  Lungs:  Normal respiratory effort, chest expands symmetrically. Lungs are clear to auscultation, no crackles or wheezes. Heart:  normal rate, regular rhythm, and no murmur.   Abdomen:  mild suprapubic tenderness without rebound or gaurding Genitalia:  normal introitus  and no external lesions.  scant cloudy vag d/c mucosa is slt injected  Msk:  no CVA tenderness  Extremities:  No clubbing, cyanosis, edema, or deformity noted with normal full range of motion of all joints.   Skin:  Intact without suspicious lesions or rashes Cervical Nodes:  No lymphadenopathy noted Inguinal Nodes:  No significant adenopathy Psych:  normal affect, talkative and pleasant    Impression &  Recommendations:  Problem # 1:  UTI (ICD-599.0) Assessment New  will cover with cipro  disc red flags to watch for will inc water pt advised to update me if symptoms worsen or do not improve  Her updated medication list for this problem includes:    Cipro 250 Mg Tabs (Ciprofloxacin hcl) .Marland Kitchen... 1 by mouth two times a day for 5 days for uti  Orders: Prescription Created Electronically [phone]) UA Dipstick W/ Micro (manual) (14782) Wet Prep ([id number])  Problem # 2:  VAGINITIS (ICD-616.10) Assessment: New  with clue cells -- BV -- pain and d/c  tx with metrogel vaginal and update   Her updated medication list for this problem includes:    Cipro 250 Mg Tabs (Ciprofloxacin hcl) .Marland Kitchen... 1 by mouth two times a day for 5 days for uti    Metrogel-vaginal 0.75 % Gel (Metronidazole) .Marland Kitchen... 1 applicator intravaginally at bedtime for 7 days  Orders: Prescription Created Electronically [phone]) UA Dipstick W/ Micro (manual) (46962) Wet Prep ([id number])  Complete Medication List: 1)  Balanced Care Tabs (Multiple vitamins-minerals) .... Take 1 tablet by mouth once a day 2)  Ibuprofen 200 Mg Tabs (Ibuprofen) .... Otc as directed. 3)  Hemocyte 324 Mg Tabs (Ferrous fumarate) .... One by mouth two times a day 4)  Delsym 30 Mg/67ml Lqcr (Dextromethorphan polistirex) .... Otc as directed. 5)  Proair Hfa 108 (90 Base) Mcg/act Aers (Albuterol sulfate) .... Take 2 puffs every 4hrs as needed chest tightness 6)  Loestrin Fe 1/20 1-20 Mg-mcg Tabs (Norethin ace-eth estrad-fe) .Marland Kitchen.. 1 by mouth once daily as directed 7)  Claritin 10 Mg Tabs (Loratadine) .... Take 1 tablet by mouth once a day as needed 8)  Triamcinolone Acetonide 0.1 % Crea (Triamcinolone acetonide) .... Apply to aa under r arm two times a day x 2 weeks as needed 9)  Cipro 250 Mg Tabs (Ciprofloxacin hcl) .Marland Kitchen.. 1 by mouth two times a day for 5 days for uti 10)  Metrogel-vaginal 0.75 % Gel (Metronidazole) .Marland Kitchen.. 1 applicator intravaginally at bedtime for 7  days  Patient Instructions: 1)  take the cipro for uti 2)  continue drinking lots of water 3)  call or seek care is symptoms don't improve in 2-3 days or if you develop back pain, nausea, or vomiting 4)  use the metrogel vaginal for bacterial vaginitis  5)  update me if not improved  Prescriptions: METROGEL-VAGINAL 0.75 % GEL (METRONIDAZOLE) 1 applicator intravaginally at bedtime for 7 days  #1 course x 0   Entered and Authorized by:   Judith Part MD   Signed by:   Judith Part MD on 02/03/2010   Method used:   Electronically to        [facility] [address] [id number] (retail)       [address] [place], [place]  [address]       Ph: [phone]       Fax: [phone]   RxID:   [id number] CIPRO 250  MG TABS (CIPROFLOXACIN HCL) 1 by mouth two times a day for 5 days for uti  #10 x 0   Entered and Authorized by:   Judith Part MD   Signed by:   Judith Part MD on 02/03/2010   Method used:   Electronically to        [facility] [address] [id number] (retail)       [address] [place], [place]  [address]       Ph: [phone]       Fax: [phone]   RxID:   [id number]    Orders Added: 1)  Prescription Created Electronically [phone] 2)  UA Dipstick W/ Micro (manual) [81000] 3)  Wet Prep [[id number]] 4)  Est. Patient Level IV [32951]    Current Allergies (reviewed today): ! ASA ! MORPHINE  Laboratory Results   Urine Tests  Date/Time Received: February 03, 2010 9:30 AM  Date/Time Reported: February 03, 2010 9:30 AM   Routine Urinalysis   Color: yellow Appearance: slightly hazy Glucose: negative   (Normal Range: Negative) Bilirubin: negative   (Normal Range: Negative) Ketone: negative   (Normal Range: Negative) Spec. Gravity: 1.015   (Normal Range: 1.003-1.035) Blood: trace-lysed   (Normal Range: Negative) pH: 5.0   (Normal Range: 5.0-8.0) Protein: trace   (Normal Range: Negative) Urobilinogen: 0.2    (Normal Range: 0-1) Nitrite: negative   (Normal Range: Negative) Leukocyte Esterace: trace   (Normal Range: Negative)  Urine Microscopic WBC/HPF: 3-4 RBC/HPF: 1-2 Bacteria/HPF: mod Mucous/HPF: few Epithelial/HPF: 1-2 Crystals/HPF: 0 Casts/LPF: 0 Yeast/HPF: 0 Other: 0      Wet Mount Source: vaginal WBC/hpf: 5-10 Bacteria/hpf: 1+  Rods Clue cells/hpf: moderate  Negative whiff Yeast/hpf: none Wet Mount KOH: Negative Trichomonas/hpf: none   Laboratory Results   Urine Tests    Routine Urinalysis   Color: yellow Appearance: slightly hazy Glucose: negative   (Normal Range: Negative) Bilirubin: negative   (Normal Range: Negative) Ketone: negative   (Normal Range: Negative) Spec. Gravity: 1.015   (Normal Range: 1.003-1.035) Blood: trace-lysed   (Normal Range: Negative) pH: 5.0   (Normal Range: 5.0-8.0) Protein: trace   (Normal Range: Negative) Urobilinogen: 0.2   (Normal Range: 0-1) Nitrite: negative   (Normal Range: Negative) Leukocyte Esterace: trace   (Normal Range: Negative)  Urine Microscopic WBC/hpf: 3-4 RBC/hpf: 1-2 Bacteria: mod Mucous: few Epithelial: 1-2 Crystals/LPF: 0 Casts/LPF: 0 Yeast/HPF: 0 Other: 0      Wet Mount/KOH Source: vaginal  WBC/hpf 5-10 Bacteria/hpf 1+  Rods Clue cells/hpf moderate  Negative whiff Yeast/hpf none KOH Negative Trichomonas/hpf none

## 2010-05-12 NOTE — Progress Notes (Signed)
Summary: pt stopped iron  Phone Note Call from Patient   Caller: Patient Summary of Call: Pt states she stopped taking iron pills and feels very weak.  I advised pt that in august she was told to continue iron and follow up in 6 months.  She says she thought she was told to stop iron, so she did.  Advised pt to restart iron, see how she feels in a couple of months. Initial call taken by: Lowella Petties CMA, AAMA,  May 03, 2010 11:07 AM  Follow-up for Phone Call        get back on iron - she should have refils - let me know if not please schedule labs for this week cbc with diff for iron def anemia as well - thanks  Follow-up by: Judith Part MD,  May 03, 2010 11:59 AM  Additional Follow-up for Phone Call Additional follow up Details #1::        Left message for patient to call back. Lewanda Rife LPN  May 03, 2010 4:40 PM   Patient notified as instructed by telephone. lab appointment scheduled as instructed  05/06/10 at 8:55am.Rena [facility] LPN  May 04, 2010 3:48 PM

## 2010-08-17 NOTE — Op Note (Signed)
[name], [name]                   ACCOUNT NO.:  [id number]   MEDICAL RECORD NO.:  [id number]          PATIENT TYPE:  AMB   LOCATION:  [facility]                         FACILITY:  [facility]   PHYSICIAN:  Valetta Fuller, M.D.  DATE OF BIRTH:  1965/05/05   DATE OF PROCEDURE:  12/20/2006  DATE OF DISCHARGE:                               OPERATIVE REPORT   PREOPERATIVE DIAGNOSIS:  Left ureteral calculus.   POSTOPERATIVE DIAGNOSIS:  Left flank pain of uncertain etiology.   PROCEDURE:  Cystoscopy, bilateral retrograde pyelography, left  ureteroscopy.   SURGEON:  Valetta Fuller, M.D.   ASSISTANT:  Tarri Glenn, M.D.   ANESTHESIA:  General.   INDICATIONS FOR PROCEDURE:  [name] is a 45 year old female who has  known punctate bilateral renal stones who was evaluated in the emergency  department setting for left sided flank pain.  She underwent a non  contrast CT abdomen and pelvis which revealed a likely 3 mm left  ureterovesical junction stone.  The patient was in a significant amount  of pain and did not desire a trial of passage.  She continued to have a  significant amount of pain including the need for IV narcotics just  prior to the procedure.   DESCRIPTION OF PROCEDURE IN DETAIL:  The patient was brought to the  operating room.  She was identified by arm band, informed consent was  verified, and preoperative time out was performed.  After the successful  induction of general anesthesia, the patient was moved to the dorsal  lithotomy position.  All appropriate pressure points were padded to  avoid neurapraxia and compartment syndrome.  Sequential compression  devices were employed and perioperative antibiotics were administered.  The perineum was prepped and draped and the surgeons gowned and gloved.  A 22.5 French cystoscopic sheath was used to introduce the 12 degree  cystoscopic lens transurethrally into the bladder.  Pancystourethroscopy  was performed.  Bilateral ureteral  orifices were noted to be in their  normal anatomic position along the trigone and each was seen to efflux  clear urine.  Systematic inspection of the remainder of the bladder  mucosa revealed no evidence of papillary lesions, erythema, foreign  bodies, diverticula, or stones.   Attention was turned to the left ureteral orifice.  It was cannulated  with a 6 French Indole catheter.  Contrast was injected and a left  retrograde pyelogram was performed.  Left retrograde pyelogram revealed  a ureter that was normal in course and caliber.  There were no  calcifications on scout images.  There were no filling defects in the  ureter.  The contrast opacified all of the calyces in the left  collecting system and they were free of any hydronephrosis or filling  defects.  Because of this and because of the patient's occasional  complaint of right sided flank pain, the decision was made to proceed  with right retrograde pyelography.  Similarly, the right ureteral  orifice was cannulated with 6 French Indole catheter.  Contrast was  injected and a right retrograde pyelogram was performed.  Right  retrograde pyelogram revealed a ureter that was normal in course and  caliber.  There were no calcifications on scout imaging.  There were no  filling defects.  Contrast identified all the calyces of the right  collecting system and it was free of any hydronephrosis or filling  defects.   The Indole catheter was then used to recannulate the left side and a  Sensor wire was advanced into the renal pelvis under fluoroscopic  control.  The cystoscope and Indole catheter were removed.  A 6-French 7  degrees short semi-rigid ureteroscope was then driven into the ureter  under direct vision.  It was driven to the level of the ureteropelvic  junction.  The entire ureter was inspected in both antegrade and  retrograde fashion.  There were no calcifications.  There was very  minimal edema just proximal to the  urethrovesical junction, but there  was no definitive sign of stone.  Having completed diagnostic  ureteroscopy, the ureteroscope and wire was removed.  The bladder was  drained.  The procedure was terminated.  The patient tolerated the  procedure well and there were no complications.  Dr. Barron Alvine was  the attending primary responsible physician and was present and  participated in all aspects.   DISPOSITION:  The patient was extubated uneventfully and transferred  safely to the post anesthesia care unit.  Should she continue to have  significant pain, she will need a thorough evaluation by her primary  care physician.     ______________________________  Tarri Glenn, M.D.      Valetta Fuller, M.D.  Electronically Signed    JR/MEDQ  D:  12/20/2006  T:  12/20/2006  Job:  [id number]

## 2010-08-20 NOTE — Op Note (Signed)
[name], [name]                             ACCOUNT NO.:  [id number]   MEDICAL RECORD NO.:  [id number]                   PATIENT TYPE:  INP   LOCATION:  9101                                 FACILITY:  [facility]   PHYSICIAN:  Georgina Peer, M.D.              DATE OF BIRTH:  [birth year]   DATE OF PROCEDURE:  01/04/2002  DATE OF DISCHARGE:                                 OPERATIVE REPORT   PREOPERATIVE DIAGNOSES:  1. Pregnancy at 36-1/7 weeks.  2. Previous cesarean section x 2.  3. Right flank pain.  4. Polyhydramnios.  5. Early labor.  6. Desires sterilization.   POSTOPERATIVE DIAGNOSES:  1. Pregnancy at 36-1/7 weeks.  2. Previous cesarean section x 2.  3. Right flank pain.  4. Polyhydramnios.  5. Early labor.  6. Desires sterilization.   PROCEDURE:  Repeat low cervical transverse cesarean section and postpartum  tubal ligation.   SURGEON:  Georgina Peer, M.D.   ASSISTANT:  Rudy Jew. Ashley Royalty, M.D.   ANESTHESIA:  Spinal, Raul Del, M.D.   ESTIMATED BLOOD LOSS:  700 cc.   COMPLICATIONS:  None.   FINDINGS:  6 pound 12 ounce female at 2232, Apgars 8 and 9.  Normal tubes and  ovaries.   INDICATIONS:  This is a 45 year old gravida 3, para 2, EDC is January 31, 2002.  She presented at 36-1/7 weeks with regular contractions.  The patient  had an amniocentesis on January 03, 2002, showing an LS ratio of 1.4:1 and PT  negative.  However, with regular contractions, cervical softening and 1 cm  dilation, in spite of therapeutic rest and IV fluids, it was thought that  the patient should be delivered as labor was beginning.  The patient also  desired permanent sterilization which had been discussed in the office.   DESCRIPTION OF PROCEDURE:  The patient was taken to the operating room and  given a spinal anesthetic by Dr. Tacy Dura, placed in the dorsal supine left  lateral uterine displacement positioning.  Fetal heart tones were present  and normal.  Foley catheter was  placed in the bladder.  Her abdomen was  prepped and draped sterilely.  After adequate anesthesia was documented with  an Allis test, transverse skin incision was made.  There was noted to be  some scarring between Scarpa's and the fascia, scarring over the muscles.  The muscles were separated.  The peritoneum was opened sharply by elevating  the muscles and identifying the peritoneum and opening it.  There were no  adhesions under the peritoneum.  The peritoneum was freed from the muscles  and widened to the level of the bladder.  A transverse bladder flap was  created, and a transverse uterine incision was made.  Membranes brought  through the incision were ruptured clear with copious amounts of amniotic  fluid.  The infant was in a vertex presentation and  was unengaged.  Vacuum  extraction was used to bring the head into the incision and fundal pressure  used to deliver the head.  The infant was delivered at 2232.  Mouth and nose  were suctioned.  There was a loose nuchal cord x 1.  The rest of the body  was then delivered.  The infant cried spontaneously.  Cord was doubly  clamped and cut.  The infant was handed to the pediatric team in attendance.  Cord blood was taken.  Apgars were assigned at 8 and 9.  The weight was 6  pounds 12 ounces.  The placenta and membranes were removed, and the incision  was without extension.  It was closed in one layer with running locked  Vicryl.  The uterus contracted well.  Interrupted sutures of Vicryl were  used to control bleeding.  The tubal ligation was then accomplished by  identifying the left cornua area.  The tube appeared normal and the ovary  appeared normal.  The tube was traced to its fimbriated end and a midportion  of knuckle elevated, two plain ties placed around this knuckle and the  midportion excised.  Both lumens identified.  The right tube was similarly  identified.  The midportion of the knuckle was identified.  A small hole in   the mesosalpinx was made and two plain ties were placed approximately 3 cm  apart with the midportion excised.  Both lumens identified.  There was no  bleeding.  The right ovary also appeared normal, and there was no intra-  abdominal reason to explain the patient's right flank pain other than the  renal stones that were identified previously during the pregnancy.  The  incision was again inspected and one suture of Vicryl was used to control  the bleeding area.  At this point, the pelvis was irrigated.  Blood and  debris were removed.  Muscles sewn over the bladder.  The fascia was closed  with Vicryl suture.  Subcutaneous tissues were cauterized for bleeders, and  the skin closed with skin staples.  The patient received Ancef 1 g  preoperatively.  Sponge, needle and instrument counts were correct.                                               Georgina Peer, M.D.    JPN/MEDQ  D:  01/04/2002  T:  01/06/2002  Job:  [id number]

## 2010-08-20 NOTE — Procedures (Signed)
[facility]  Patient:    [name], [name]                     MRN: [id number] Proc. Date: 12/31/99 Adm. Date:  [id number] Attending:  Starr Sinclair CC:         Corwin Levins, M.D. [facility]   Procedure Report  PROCEDURE:  Esophagogastroduodenoscopy with esophageal brushings.  ENDOSCOPIST:  Venita Lick. Pleas Koch., M.D.  REFERRING PHYSICIAN:  Corwin Levins, M.D.  INDICATION:  A 45 year old female who notes some mild discomfort swallowing for the past week followed by acute epigastric pain associated with nausea, vomiting, and hematemesis.  Examination by Dr. Jonny Ruiz today revealed no evidence of orthostasis.  She had 1+ Hemoccult-positive stool.  She had not had any recurrent nausea and vomiting in 18-20 hours.  PHYSICAL EXAMINATION:  CHEST:  Clear to auscultation.  CARDIAC:  Regular rate and rhythm without murmurs.  NEUROLOGIC:  Alert and oriented x 3.  ANESTHESIA:  Fentanyl 75 mcg IV, Versed 9 mg IV, and pharyngeal Cetacaine spray.  MONITORING:  Automated blood pressure monitor, pulse oximeter, and cardiac monitor.  Low-flow oxygen was given by nasal cannula throughout the procedure. The procedure was well-tolerated without any immediate complications.  DESCRIPTION OF PROCEDURE:  After the nature of the procedure was discussed with the patient, including a discussion of its risks, benefits, and alternatives, she consented to proceed.  She was then comfortably sedated in the left lateral decubitus position.  The Olympus video endoscope was inserted to the posterior pharynx, and the esophagus was intubated under direct visualization.  In the upper third of the esophagus, there were multiple whitish and yellowish-colored exudate consistent with Candida.  Video photographs were obtained, and brushings were obtained.  The midesophagus and distal esophagus appeared normal except for some very minimal erythematous prominence to the vascular  pattern just proximal to the Z-line.  Examination of the gastric body was unremarkable.  Examination of the angularis, lesser curvature, cardia, and fundus was unremarkable.  The gastric antrum and gastric pylorus were unremarkable.  In the duodenal bulb, there were two small erosions.  The descending duodenum was unremarkable.  Video photographs were obtained, the stomach was decompressed, and the endoscope was withdrawn from the patient.  IMPRESSION: 1. Proximal exudative esophagitis consistent with Candida. 2. Grade 1 distal esophagitis consistent with reflux. 3. Mild erosive duodenitis.  RECOMMENDATION: 1. Await brushings.  Begin Diflucan empirically. 2. Continue Prevacid 30 mg q.d. along with antireflux measures. 3. Obtain a serum Helicobacter pylori antibody and treat if positive. 4. Full liquid diet until she can tolerate a return to a regular diet. 5. Further evaluation with Dr. Jonny Ruiz to help determine the etiology of her    Candida esophagitis and rule out any underlying immunosuppression. DD:  12/31/99 TD:  01/01/00 Job: [id number]

## 2010-08-20 NOTE — Op Note (Signed)
[facility]  Patient:    [name], [name]                     MRN: [id number] Proc. Date: 08/13/99 Adm. Date:  [id number] Disc. Date: [id number] Attending:  Leonard Schwartz                           Operative Report  PREOPERATIVE DIAGNOSIS:       Irregular bleeding, possible endometrial mass, and right lower quadrant pelvic pain, with history of pelvic adhesions.  POSTOPERATIVE DIAGNOSIS:      Normal hysteroscopy, anterior uterine adhesions.  OPERATION:                    Diagnostic hysteroscopy, D&C.  Diagnostic laparoscopy with lysis of adhesions.  SURGEON:                      Georgina Peer, M.D.  ASSISTANT:  ANESTHESIA:                   General anesthesia.  ESTIMATED BLOOD LOSS:         50 cc.  COMPLICATIONS:                None.  FINDINGS:                     Normal endometrial cavity, anterior uterine adhesions to the anterior abdominal wall which were dense and vascular, not involving the  bladder.  The tubes and ovaries were normal.  INDICATIONS:                  This is a 45 year old African-American female with irregular bleeding and pelvic pain.  She had had a laparoscopy in 1995 which revealed adhesions of the anterior uterus to the anterior abdominal wall. There was no lysis of adhesions at that time.  The patient had tenderness in the right lower quadrant without mass.  Ultrasound showed mild enlargement of the endometrial cavity.  DESCRIPTION OF PROCEDURE:     The patient was taken to the operating room and given a general anesthesia, and placed in the dorsal lithotomy position.  The abdomen, perineum, and vagina were prepped and draped sterilely.  The cervix was dilated to 60 [place] and hysteroscope was used.  The endocervix and endometrial cavity appeared normal.  There was no abnormal tissue noted.  Some mild thickening on he posterior surface was curetted.  There was no evidence of fibroids  or adhesions. The other instruments were removed and the uterine manipulator was placed.  A vertical subumbilical incision was made.  2.5 liters of carbon dioxide insufflated under low pressures.  The laparoscopic trocar and sleeve were introduced.  Then  under direct vision, a right lower quadrant and left lower quadrant 5 mm port were introduced.  The following pelvic findings were noted.  There appeared to be no  injury from laparoscopic trocar placement.  The surface of the bowel appeared normal.  The tubes and ovaries appeared normal.  The uterus was suspended by its anterior surface in its lower segment to the anterior abdominal wall just superior to the bladder.  A three-way Foley catheter had been placed to allow filling of the bladder.  The bladder was filled and seemed to be distant from these adhesions.  Using the Harmonic scalpel with the dissecting blade, the adhesions from the anterior abdominal  wall were taken down.  There was no entry into the bladder nor blue dye which was instilled into the bladder found to be spilling.  All bleeders were cauterized.  The adhesions were taken down all the way to the left pelvic ide wall.  There was good mobility of the uterus and good hemostasis.  Irrigation fluid was removed.  Some fluid was left in.  The ports were removed.  The gas allowed to escape from the abdomen.  Photodocumentation was made before this was done. The incisions were injected with a total of 10 cc of 0.25% Marcaine and closed with 4-0 Dexon.  The vaginal instruments were removed.  Sponge, needle, and instrument counts were correct.  The patient was transferred to the recovery room in stable condition. DD:  08/13/99 TD:  08/16/99 Job: [id number]

## 2010-08-20 NOTE — Discharge Summary (Signed)
[name], [name]                             ACCOUNT NO.:  [id number]   MEDICAL RECORD NO.:  [id number]                   PATIENT TYPE:  INP   LOCATION:  9101                                 FACILITY:  [facility]   PHYSICIAN:  Georgina Peer, M.D.              DATE OF BIRTH:  29-Jun-1965   DATE OF ADMISSION:  01/04/2002  DATE OF DISCHARGE:  01/07/2002                                 DISCHARGE SUMMARY   ADMISSION DIAGNOSES:  1. 36-1/7th weeks intrauterine pregnancy.  2. Previous cesarean section times two.  3. Desires permanent sterilization.  4. Chronic right flank pain.  5. Early labor.   DISCHARGE DIAGNOSES:  1. 36-1/7th weeks intrauterine pregnancy.  2. Previous cesarean section times two.  3. Desires permanent sterilization.  4. Chronic right flank pain.  5. Early labor.   BRIEF HISTORY:  This 45 year old gravida 3, para 2, EDC 01/31/02 presented  with regular uterine contractions that did not go away with IV fluids and  analgesics. The patient's cervix softened and shortened. She desired repeat  c-section after two previous c-sections and also post partum tubal ligation.   HOSPITAL COURSE:  The patient underwent a repeat low transverse cesarean  section at which time a viable female infant 6 pounds 12 ounces named  [name] was born at 22:32 on 01/04/02. Apgars were rated at 9 and 9. Tubal  ligation was performed without incident.   Postoperatively, the patient did well. Hemoglobin was 9.4 from a preop of  10.8, platelet count of 232,000. The patient had the Ancef continued for 24  hours and remained afebrile. She had no nausea and vomiting. She passed gas  on the first day and was advanced to a regular diet. She did have some gas  pains, however, no nausea or vomiting, and good bowel sounds. Her incision  was clean and dry. She was placed on oral Demerol for pain which worked  adequately, however, less than desired due to a long history of Percocet use  for her renal  stones and chronic right flank pain that she had for the last  two months before delivery. The infant was thriving; she was bottle feeding.  Her staples were removed on the third day. She was ambulating and voiding  without difficulty and her urinalysis was negative, and she was discharged  home in good condition with a prescription for Demerol 100 mg tablets, #60,  to take q. 4-6 hours, Motrin 600 mg q. 6 hours, instruction booklet and  follow up in the office in two weeks.                                                 Georgina Peer, M.D.    JPN/MEDQ  D:  01/07/2002  T:  01/07/2002  Job:  [id number]

## 2010-09-29 ENCOUNTER — Other Ambulatory Visit (INDEPENDENT_AMBULATORY_CARE_PROVIDER_SITE_OTHER): Payer: 59

## 2010-09-29 DIAGNOSIS — Z0000 Encounter for general adult medical examination without abnormal findings: Secondary | ICD-10-CM

## 2010-09-29 LAB — CBC WITH DIFFERENTIAL/PLATELET
Basophils Absolute: 0 10*3/uL (ref 0.0–0.1)
Basophils Relative: 0.6 % (ref 0.0–3.0)
Eosinophils Absolute: 0.2 10*3/uL (ref 0.0–0.7)
Hemoglobin: 12.3 g/dL (ref 12.0–15.0)
Lymphocytes Relative: 39.2 % (ref 12.0–46.0)
Lymphs Abs: 2.7 10*3/uL (ref 0.7–4.0)
MCHC: 33.4 g/dL (ref 30.0–36.0)
MCV: 80.9 fl (ref 78.0–100.0)
Monocytes Absolute: 0.5 10*3/uL (ref 0.1–1.0)
Neutro Abs: 3.4 10*3/uL (ref 1.4–7.7)
RBC: 4.55 Mil/uL (ref 3.87–5.11)
RDW: 16.7 % — ABNORMAL HIGH (ref 11.5–14.6)

## 2010-09-29 LAB — HEPATIC FUNCTION PANEL
ALT: 14 U/L (ref 0–35)
AST: 16 U/L (ref 0–37)
Albumin: 3.8 g/dL (ref 3.5–5.2)
Alkaline Phosphatase: 60 U/L (ref 39–117)
Bilirubin, Direct: 0.1 mg/dL (ref 0.0–0.3)
Total Protein: 6.5 g/dL (ref 6.0–8.3)

## 2010-09-29 LAB — BASIC METABOLIC PANEL
BUN: 12 mg/dL (ref 6–23)
Calcium: 9 mg/dL (ref 8.4–10.5)
Creatinine, Ser: 0.8 mg/dL (ref 0.4–1.2)
GFR: 107.58 mL/min (ref >60.00)

## 2010-09-29 LAB — LIPID PANEL
Cholesterol: 179 mg/dL (ref 0–200)
LDL Cholesterol: 123 mg/dL — ABNORMAL HIGH (ref 0–99)
Triglycerides: 34 mg/dL (ref 0.0–149.0)

## 2010-10-02 ENCOUNTER — Encounter: Payer: Self-pay | Admitting: Family Medicine

## 2010-10-11 ENCOUNTER — Other Ambulatory Visit (HOSPITAL_COMMUNITY)
Admission: RE | Admit: 2010-10-11 | Discharge: 2010-10-11 | Disposition: A | Discharge status: Home / Self Care | Payer: 59 | Source: Ambulatory Visit | Attending: Family Medicine | Admitting: Family Medicine

## 2010-10-11 ENCOUNTER — Encounter: Payer: Self-pay | Admitting: Family Medicine

## 2010-10-11 ENCOUNTER — Ambulatory Visit (INDEPENDENT_AMBULATORY_CARE_PROVIDER_SITE_OTHER): Payer: 59 | Admitting: Family Medicine

## 2010-10-11 DIAGNOSIS — Z01419 Encounter for gynecological examination (general) (routine) without abnormal findings: Secondary | ICD-10-CM

## 2010-10-11 DIAGNOSIS — R350 Frequency of micturition: Secondary | ICD-10-CM | POA: Insufficient documentation

## 2010-10-11 DIAGNOSIS — Z1231 Encounter for screening mammogram for malignant neoplasm of breast: Secondary | ICD-10-CM

## 2010-10-11 DIAGNOSIS — D259 Leiomyoma of uterus, unspecified: Secondary | ICD-10-CM

## 2010-10-11 DIAGNOSIS — Z0000 Encounter for general adult medical examination without abnormal findings: Secondary | ICD-10-CM

## 2010-10-11 DIAGNOSIS — D509 Iron deficiency anemia, unspecified: Secondary | ICD-10-CM

## 2010-10-11 DIAGNOSIS — N920 Excessive and frequent menstruation with regular cycle: Secondary | ICD-10-CM

## 2010-10-11 LAB — POCT URINALYSIS DIPSTICK
Spec Grav, UA: 1.01
Urobilinogen, UA: 0.2
pH, UA: 7

## 2010-10-11 LAB — HM PAP SMEAR: HM Pap smear: NEGATIVE

## 2010-10-11 NOTE — Assessment & Plan Note (Signed)
Reviewed health habits including diet and exercise and skin cancer prevention Also reviewed health mt list, fam hx and immunizations  Rev wellness labs in detail  Disc low sat fat diet to keep chol under control

## 2010-10-11 NOTE — Assessment & Plan Note (Signed)
From menses and also sickle cell trait  Well controlled with daily iron supplement Adv to continue that

## 2010-10-11 NOTE — Assessment & Plan Note (Signed)
With worsening menorrhagia and urine frequency ( ? If that is rel) Ref to gyn to disc tx options

## 2010-10-11 NOTE — Progress Notes (Signed)
Subjective:    Patient ID: [name], female    DOB: September 28, 1965, 45 y.o.   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  Hx of fibroids  peroids are still very heavy - tampon and 2 pads at the same time with clotting (heavy for just few days)  Is interested in seeing gyn for that  Takes iron daily for this and hb is normal   tdap 09  Mam 6.11-- wants to schedule that  Self exam -- no new lumps or changes   Gluc 104 this check Wt is up 4 lb  LDL123- up from 109 Good HDL  Lab Results  Component Value Date   CHOL 179 09/29/2010   CHOL 168 09/15/2009   CHOL 173 08/21/2008   Lab Results  Component Value Date   HDL 49.50 09/29/2010   HDL 40.98 09/15/2009   HDL 11.91 08/21/2008   Lab Results  Component Value Date   LDLCALC 123* 09/29/2010   LDLCALC 109* 09/15/2009   LDLCALC 113* 08/21/2008   Lab Results  Component Value Date   TRIG 34.0 09/29/2010   TRIG 64.0 09/15/2009   TRIG 63.0 08/21/2008   Lab Results  Component Value Date   CHOLHDL 4 09/29/2010   CHOLHDL 4 09/15/2009   CHOLHDL 4 08/21/2008   No results found for this basename: LDLDIRECT    Patient Active Problem List  Diagnoses  . FIBROIDS, UTERUS  . ANEMIA, IRON DEFICIENCY  . ALLERGIC RHINITIS  . GERD  . MENORRHAGIA  . POSITIVE PPD  . RENAL CALCULUS, HX OF  . Routine general medical examination at a health care facility  . Other screening mammogram  . Frequent urination  . Gynecological examination   Past Medical History  Diagnosis Date  . GERD (gastroesophageal reflux disease)   . Anemia     iron  deficiency anemia  . Sickle cell trait   . Fibroid uterus     painful menses  . Menorrhagia   . Allergy     allergic rhinitis  . Personal history of kidney stones   . PPD positive     Hx of  . HPV in female     also some abnormal paps in past   Past Surgical History  Procedure Date  . Breast surgery     removal of breast mass / benign /age 8-20  . Cesarean section     x3  . Tubal ligation     BTL  . Tonsillectomy and adenoidectomy     age 27  . Appendectomy     6   History  Substance Use Topics  . Smoking status: Never Smoker   . Smokeless tobacco: Not on file  . Alcohol Use:    Family History  Problem Relation Age of Onset  . Cancer Maternal Aunt     breast CA  . Cancer Paternal Aunt     uterine CA  . Lupus Cousin    Allergies  Allergen Reactions  . Aspirin  REACTION: bleeding  . Morphine     REACTION: itch   Current Outpatient Prescriptions on File Prior to Visit  Medication Sig Dispense Refill  . ferrous fumarate (HEMOCYTE) 325 (106 FE) MG TABS Take 1 tablet by mouth 2 (two) times daily.        . Multiple Vitamins-Minerals (BALANCED CARE PO) Take 1 tablet by mouth daily.        . norethindrone-ethinyl estradiol (JUNEL FE 1/20) 1-20 MG-MCG per tablet Take 1 tablet by mouth daily.        Marland Kitchen albuterol (PROAIR HFA) 108 (90 BASE) MCG/ACT inhaler Inhale 2 puffs into the lungs every 4 (four) hours as needed.        . loratadine (CLARITIN) 10 MG tablet Take 10 mg by mouth daily as needed.           Review of Systems Review of Systems  Constitutional: Negative for fever, appetite change, and unexpected weight change. pos for fatigue  Eyes: Negative for pain and visual disturbance.  Respiratory: Negative for cough and shortness of breath.   Cardiovascular: Negative.  for cp or sob  Gastrointestinal: Negative for nausea, diarrhea and constipation.  Genitourinary: Negative for urgency and frequency. pos for heavy menses and cramping  Skin: Negative for pallor.   Neurological: Negative for weakness, light-headedness, numbness and headaches.  Hematological: Negative for adenopathy. Does not bruise/bleed easily.  Psychiatric/Behavioral: Negative for dysphoric mood. The patient is not nervous/anxious.          Objective:   Physical Exam  Constitutional: She appears well-developed and well-nourished. No distress.  HENT:  Head: Normocephalic and atraumatic.  Eyes: Conjunctivae and EOM are normal. Pupils are equal, round, and reactive to light.  Neck: Normal range of motion. Neck supple. No JVD present. Carotid bruit is not present. No tracheal deviation present. No thyromegaly present.  Cardiovascular: Normal rate, regular rhythm, normal heart sounds and intact distal pulses.   Pulmonary/Chest: Effort normal and breath sounds normal. No respiratory distress. She has no wheezes.  Abdominal: Soft. Bowel sounds are normal. She exhibits no distension and no mass. There is no tenderness.  Genitourinary: Vagina normal. No breast swelling, tenderness, discharge or bleeding. Uterus is enlarged. Uterus is not tender. Cervix exhibits no motion tenderness. Right adnexum displays no mass, no tenderness and no fullness. Left adnexum displays no mass, no tenderness and no fullness. No vaginal discharge found.  Musculoskeletal: Normal range of motion. She exhibits no edema and no tenderness.  Neurological: She is alert. She has normal reflexes. No cranial nerve deficit. Coordination normal.  Skin: Skin is warm and dry. No rash noted. No erythema. No pallor.  Psychiatric: She has a normal mood and affect.          Assessment & Plan:

## 2010-10-11 NOTE — Assessment & Plan Note (Signed)
More of a problem as she gets older Known hx of fibroids  Heavy bleeding and clots for 2 d of menses On OC  Will ref to gyn for further eval and disc of tx opt

## 2010-10-11 NOTE — Assessment & Plan Note (Signed)
ua normal  Likely because of recent inc water intake  Will udate me if symptoms worsen or change

## 2010-10-11 NOTE — Assessment & Plan Note (Signed)
sched this Enc monthly self exams Nl exam in office today

## 2010-10-11 NOTE — Patient Instructions (Addendum)
We will do referral for mammogram and gyn referral at check out  Continue your iron supplement  Urine test was clear today  Labs look ok just watch sugar in diet and also saturated fats  Get started on exercise regularly  Try to eat snacks with protien when you have to miss a meal to keep your metabolism running  If you take pain medicines like ibuprofen (motrin / advil) or aleve- always take it on a full stomach Tylenol should not bother your stomach

## 2010-10-11 NOTE — Assessment & Plan Note (Signed)
Annual exam with pap done  Fibroid hx and heavy menses- ref to gyn

## 2010-10-13 ENCOUNTER — Ambulatory Visit (INDEPENDENT_AMBULATORY_CARE_PROVIDER_SITE_OTHER): Payer: 59 | Admitting: Gynecology

## 2010-10-13 DIAGNOSIS — B373 Candidiasis of vulva and vagina: Secondary | ICD-10-CM

## 2010-10-13 DIAGNOSIS — N898 Other specified noninflammatory disorders of vagina: Secondary | ICD-10-CM

## 2010-10-13 DIAGNOSIS — D259 Leiomyoma of uterus, unspecified: Secondary | ICD-10-CM

## 2010-10-13 DIAGNOSIS — N920 Excessive and frequent menstruation with regular cycle: Secondary | ICD-10-CM

## 2010-10-15 ENCOUNTER — Telehealth: Payer: Self-pay

## 2010-10-15 NOTE — Telephone Encounter (Signed)
Message copied by Patience Musca on Fri Oct 15, 2010  4:09 PM ------      Message from: Roxy Manns A      Created: Thu Oct 14, 2010  1:53 PM       Please let pt know neg pap- that is good       There was yeast , however and I would like to tx that       Please call in diflucan 150mg  1 po times one #1 no ref       Update if any probems      Thanks!

## 2010-10-15 NOTE — Telephone Encounter (Signed)
Patient notified as instructed by telephone. Pt saw OB/GYN on Wed and was prescribed Diflucan. Pt said she is doing fine and appreciates the call. Pt did not want med called in. Health maintenance updated.

## 2010-10-20 ENCOUNTER — Ambulatory Visit: Payer: 59

## 2010-10-20 ENCOUNTER — Ambulatory Visit
Admission: RE | Admit: 2010-10-20 | Discharge: 2010-10-20 | Disposition: A | Discharge status: Home / Self Care | Payer: 59 | Source: Ambulatory Visit | Attending: Family Medicine | Admitting: Family Medicine

## 2010-10-20 DIAGNOSIS — Z1231 Encounter for screening mammogram for malignant neoplasm of breast: Secondary | ICD-10-CM

## 2010-10-27 ENCOUNTER — Ambulatory Visit: Payer: 59 | Admitting: Gynecology

## 2010-10-27 ENCOUNTER — Other Ambulatory Visit: Payer: 59

## 2010-11-02 ENCOUNTER — Other Ambulatory Visit: Payer: Self-pay

## 2010-11-02 ENCOUNTER — Ambulatory Visit (INDEPENDENT_AMBULATORY_CARE_PROVIDER_SITE_OTHER): Payer: 59 | Admitting: Gynecology

## 2010-11-02 ENCOUNTER — Encounter: Payer: Self-pay | Admitting: Gynecology

## 2010-11-02 ENCOUNTER — Other Ambulatory Visit: Payer: 59

## 2010-11-02 DIAGNOSIS — D259 Leiomyoma of uterus, unspecified: Secondary | ICD-10-CM

## 2010-11-02 DIAGNOSIS — N83209 Unspecified ovarian cyst, unspecified side: Secondary | ICD-10-CM

## 2010-11-02 DIAGNOSIS — N852 Hypertrophy of uterus: Secondary | ICD-10-CM

## 2010-11-02 DIAGNOSIS — D251 Intramural leiomyoma of uterus: Secondary | ICD-10-CM

## 2010-11-02 DIAGNOSIS — N920 Excessive and frequent menstruation with regular cycle: Secondary | ICD-10-CM

## 2010-11-02 DIAGNOSIS — N854 Malposition of uterus: Secondary | ICD-10-CM

## 2010-11-02 DIAGNOSIS — R1031 Right lower quadrant pain: Secondary | ICD-10-CM

## 2010-11-02 NOTE — Progress Notes (Signed)
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   Ultrasound initially shows multiple small myomas the largest measuring 19 mm, endometrial echo past 7.5 mm, uterus generous in size at 155 mm in length 82 and 52 mm in width and depth, right and left ovaries visualized and normal. Sonohysterogram was performed sterile technique EZ-Cap introduction good distention with no abnormalities. An endometrial sample was taken.  I reviewed options with the patient pending biopsy results to include continued hormonal regulation trial of a different oral contraceptive progesterone only type of medication such as Lupron Provera Mirena IUD endometrial ablation and hysterectomy. I discuss discussed the risks benefits of these various choices in the issues with each. She has had 3 cesarean sections and 2 laparoscopies for ovarian cysts and rule out endometriosis and I think if we choose hysterectomy that the laparoscopic approach would be warranted given the potential for adhesive disease. She also discussed with her that anytime during the procedure we may abort the due to scarring and proceed with a total abdominal hysterectomy with a larger incision and a longer recovery period all of which she understands. The ovarian conservation issue was discussed and given her age I would strongly recommend were retaining both ovaries. She is going to think of these options and await her call for the biopsy results. She knows of she does not hear from [place] within one week to call us for these results to make sure that we receive them.

## 2010-11-04 NOTE — Progress Notes (Signed)
Addended by: Ladona Horns E on: 11/04/2010 02:00 PM   Modules accepted: Orders

## 2010-11-05 ENCOUNTER — Telehealth: Payer: Self-pay | Admitting: *Deleted

## 2010-11-05 NOTE — Telephone Encounter (Signed)
PT INFORMED WITH RESULTS NOTE FOR 11/02/10.

## 2010-11-08 ENCOUNTER — Telehealth: Payer: Self-pay | Admitting: *Deleted

## 2010-11-08 NOTE — Telephone Encounter (Signed)
Tell patient that we will make arrangements in that Olegario Messier will call her to arrange the surgery and then she will see me for a preoperative visit.

## 2010-11-08 NOTE — Telephone Encounter (Signed)
PT INFORMED WITH THE BELOW NOTE. 

## 2010-11-08 NOTE — Telephone Encounter (Signed)
(  SEE CHART NOTE 10/13/10.)PT CALLED STATING SHE WANT TO PROCEED WITH SURGERY.

## 2010-11-09 ENCOUNTER — Telehealth: Payer: Self-pay

## 2010-11-09 NOTE — Telephone Encounter (Signed)
Per Dr. Reynold Bowen note below I called patient back and told her to d/c OC's immediately and advised her she may see w/d bleeding.  Rec using backup method of birth control or abstinence until surgery.  She understood she was d/c'ing to decrease the risk of thrombosis around surgery time.  I also let her know her period at surgery time not problem

## 2010-11-09 NOTE — Telephone Encounter (Signed)
I called patient to let her know that her surgery is scheduled for Th Aug 30 at 7:30am at [facility].  She will expect call from [facility] with all her instructions and to set up preop appt there. We sched preop consult with Dr. Velvet Bathe for Aug 22 at 9:30am.  Patient expected her menses around Aug 26th and questions if this will be a problem for surgery on the 30th.  I told her I felt sure it would be fine and I will only call her back if Dr. Audie Box thinks it is a problem. Please advise if ok .

## 2010-11-09 NOTE — Telephone Encounter (Signed)
That would be okay as far as surgery is concerned but I would call patient and make sure if she stops her birth control pills now no matter where she is and the pack.  She may have a withdrawal bleed but  I want her off the pills preoperatively just to decrease the risk of thrombosis around surgery time.

## 2010-11-24 ENCOUNTER — Ambulatory Visit (INDEPENDENT_AMBULATORY_CARE_PROVIDER_SITE_OTHER): Payer: 59 | Admitting: Gynecology

## 2010-11-24 ENCOUNTER — Encounter: Payer: Self-pay | Admitting: Gynecology

## 2010-11-24 ENCOUNTER — Encounter (HOSPITAL_COMMUNITY): Payer: Self-pay

## 2010-11-24 ENCOUNTER — Encounter (HOSPITAL_COMMUNITY)
Admission: RE | Admit: 2010-11-24 | Discharge: 2010-11-24 | Disposition: A | Discharge status: Home / Self Care | Payer: 59 | Source: Ambulatory Visit | Attending: Gynecology | Admitting: Gynecology

## 2010-11-24 VITALS — BP 120/60

## 2010-11-24 DIAGNOSIS — D259 Leiomyoma of uterus, unspecified: Secondary | ICD-10-CM

## 2010-11-24 DIAGNOSIS — N920 Excessive and frequent menstruation with regular cycle: Secondary | ICD-10-CM

## 2010-11-24 HISTORY — DX: Diaphragmatic hernia without obstruction or gangrene: K44.9

## 2010-11-24 LAB — CBC
HCT: 35.3 % — ABNORMAL LOW (ref 36.0–46.0)
Hemoglobin: 12.2 g/dL (ref 12.0–15.0)
MCV: 75.4 fL — ABNORMAL LOW (ref 78.0–100.0)
RBC: 4.68 MIL/uL (ref 3.87–5.11)
RDW: 15.9 % — ABNORMAL HIGH (ref 11.5–15.5)
WBC: 8.3 10*3/uL (ref 4.0–10.5)

## 2010-11-24 NOTE — Pre-Procedure Instructions (Signed)
Called Dr. Rodman Pickle to see Apolonio Schneiders for a PAT appt and Dr. Rodman Pickle approved pt being seen by MDA on DOS.

## 2010-11-24 NOTE — Progress Notes (Signed)
Patient presents preop for upcoming TLH. History of menorrhagia dysmenorrhea requiring double protection bleed through episodes. Had been started on oral contraceptives with continued bleeding.  Outpatient evaluation included a sonohysterogram which showed an empty endometrial cavity and multiple intramural myomas largest measuring 19 mm. Left ovary was normal right ovary was normal. Endometrial biopsy was negative. Options for management reviewed with the patient to include expected management, attempts at hormonal manipulation, Depo-Provera Depo-Lupron, Mirena IUD, endometrial ablation, uterine artery embolization, myomectomy, hysterectomy. Patient elects for hysterectomy.  Exam HEENT normal Lungs clear Cardiac regular rate no rubs murmurs or gallops Abdomen soft nontender without masses guarding rebound organomegaly Pelvic external BUS vagina normal, cervix normal, uterus grossly normal in size retroverted nontender adnexa without masses or tenderness  Assessment and plan: 45 year old G3 P3 female status post BTL unacceptable menorrhagia dysmenorrhea for attempted TLH. Patient has had multiple surgeries to include 3 cesarean sections laparoscopy x2 appendectomy. Review of her last cesarean section note describes a clear pelvis without significant abdominal adhesions. I reviewed with the patient the laparoscopic approach she does understand that with prior surgeries and potential for adhesions this increases her risk for complications as well as the increased risk for converting a laparoscopic approach to a total abdominal hysterectomy. She understands that if we proceed with a total abdominal hysterectomy because I feel this unsafe to proceed laparoscopically or if complications arise that this would require a longer recovery period a larger incision with larger scar all of which was accepted. What is involved with hysterectomy in general was discussed to include absolute irreversible sterility.  Sexuality following hysterectomy to include the potential for persistent orgasmic dysfunction as well as persistent dyspareunia was discussed. The ovarian conservation issue was reviewed with her. Given her age and no family history of ovarian cancer she prefers to keep both ovaries for continued hormone production. I think this is reasonable and she does understand by keeping her ovaries she also is at risk for ovarian disease in the future which may require reoperation and treatment as well as the risk of ovarian cancer. She does give me permission to remove one or both ovaries at the time of surgery if in my best judgment this needs to be done. She understands if we do remove both ovaries hormone replacement therapy may be required and we discussed in general was involved with this as well as the risks to include the WHI study. The expected intraoperative postoperative courses were reviewed to include instrumentation trocar placement multiple port sites insufflation use of sharp blunt dissection electrocautery possible laser possible permanent clips. The risks of infection requiring prolonged antibiotics abscess formation requiring reoperation abscess drainage was discussed. The risk of hemorrhage necessitating transfusion and the risks of transfusion including transfusion reaction hepatitis HIV mad cow disease and other unknown entities was discussed understood and accepted. The risk of inadvertent injury to internal organs either immediately recognized or delay recognized particularly increased due to her prior surgeries including bowel injury bladder injury ureters and nerves was reviewed and the potential for major reparative surgeries and future reparative surgeries including bowel repair bladder repair ureteral damage repair ostomy formation was all discussed understood and accepted. The patient's questions were answered to her satisfaction she is ready to proceed with surgery.

## 2010-11-24 NOTE — Patient Instructions (Signed)
20 [name]  11/24/2010   Your procedure is scheduled on:  12/02/10  Report to [facility] at 0600 AM. Desk [phone]  Call this number if you have problems the morning of surgery: [phone]   Remember:   Do not eat food:After Midnight.  Do not drink clear liquids: After Midnight.  Take these medicines the morning of surgery with A SIP OF WATER: none   Do not wear jewelry, make-up or nail polish.  Do not wear lotions, powders, or perfumes. You may wear deodorant.  Do not shave 48 hours prior to surgery.  Do not bring valuables to the hospital.  Contacts, dentures or bridgework may not be worn into surgery.  Leave suitcase in the car. After surgery it may be brought to your room.  For patients admitted to the hospital, checkout time is 11:00 AM the day of discharge.   Patients discharged the day of surgery will not be allowed to drive home.  Name and phone number of your driver: Minerva Areola [phone]  Special Instructions: CHG Shower Use Special Wash: 1/2 bottle night before surgery and 1/2 bottle morning of surgery.   Please read over the following fact sheets that you were given:

## 2010-11-24 NOTE — H&P (Signed)
[name] 02-03-1966 [id number]   History and Physical    Chief complaint: menorrhagia, leiomyoma  History of present illness: 45 y.o.  G3 P3 female status post tubal sterilization with worsening dysmenorrhea menorrhagia and requiring double protection, bleed through episodes which has become socially unacceptable. Outpatient evaluation includes ultrasound wishes numerous small myomas right left ovaries normal no evidence of endometrial cavity defects. An endometrial sample was benign. Options for management were reviewed with the patient to include hormonal manipulation, Depo-Lupron Depo-Provera, Mirena IUD, endometrial ablation, multiple myomectomy, uterine artery embolization and hysterectomy. Patient elects for hysterectomy.  Past medical history:  Sickle cell trait, positive PPD, GERD, history of kidney stone.  Past surgical history:  Cesarean section x3, BTL, laparoscopy x2 for ovarian cysts and endometriosis, appendectomy, breast biopsy  Allergies:  Morphine  Current medications:  Iron supplementation  Review of systems: Noncontributory  Family history: Noncontributory   Social history: Noncontributory   Physical exam:  Afebrile vital signs are stable  General: well developed, well nourished female, no acute distress HEENT: normal  Lungs: clear to auscultation without wheezing, rales or rhonchi  Cardiac: regular rate without rubs, murmurs or gallops  Abdomen: soft, nontender without masses, guarding, rebound, organomegaly  Pelvic: external bus vagina: normal   Cervix: grossly normal  Uterus: normal size, midline and mobile, nontender  Adnexa: without masses or tenderness    Assessment and plan: 45 year old G3 P3 female status post BTL unacceptable menorrhagia dysmenorrhea for attempted TLH. Patient has had multiple surgeries to include 3 cesarean sections laparoscopy x2 appendectomy. Review of her last cesarean section note describes a clear pelvis without significant  abdominal adhesions. I reviewed with the patient the laparoscopic approach she does understand that with prior surgeries and potential for adhesions this increases her risk for complications as well as the increased risk for converting a laparoscopic approach to a total abdominal hysterectomy. She understands that if we proceed with a total abdominal hysterectomy because I feel this unsafe to proceed laparoscopically or if complications arise that this would require a longer recovery period a larger incision with larger scar all of which was accepted. What is involved with hysterectomy in general was discussed to include absolute irreversible sterility. Sexuality following hysterectomy to include the potential for persistent orgasmic dysfunction as well as persistent dyspareunia was discussed. The ovarian conservation issue was reviewed with her. Given her age and no family history of ovarian cancer she prefers to keep both ovaries for continued hormone production. I think this is reasonable and she does understand by keeping her ovaries she also is at risk for ovarian disease in the future which may require reoperation and treatment as well as the risk of ovarian cancer. She does give me permission to remove one or both ovaries at the time of surgery if in my best judgment this needs to be done. She understands if we do remove both ovaries hormone replacement therapy may be required and we discussed in general was involved with this as well as the risks to include the WHI study. The expected intraoperative postoperative courses were reviewed to include instrumentation trocar placement multiple port sites insufflation use of sharp blunt dissection electrocautery possible laser possible permanent clips. The risks of infection requiring prolonged antibiotics abscess formation requiring reoperation abscess drainage was discussed. The risk of hemorrhage necessitating transfusion and the risks of transfusion including  transfusion reaction hepatitis HIV mad cow disease and other unknown entities was discussed understood and accepted. The risk of inadvertent injury to internal organs  either immediately recognized or delay recognized particularly increased due to her prior surgeries including bowel injury bladder injury ureters and nerves was reviewed and the potential for major reparative surgeries and future reparative surgeries including bowel repair bladder repair ureteral damage repair ostomy formation was all discussed understood and accepted. The patient's questions were answered to her satisfaction she is ready to proceed with surgery.      Dara Lords MD, 4:08 PM 11/24/2010

## 2010-12-01 ENCOUNTER — Telehealth: Payer: Self-pay | Admitting: *Deleted

## 2010-12-01 ENCOUNTER — Encounter: Payer: Self-pay | Admitting: Gynecology

## 2010-12-01 MED ORDER — DEXTROSE 5 % IV SOLN
1.0000 g | INTRAVENOUS | Status: AC
  Administered 2010-12-02: 1 g via INTRAVENOUS
  Filled 2010-12-01: qty 1

## 2010-12-01 NOTE — Telephone Encounter (Signed)
Pt called stating she has a slight cold. She is having surgery tomorrow for TLH she took alka-seltzer plus to help with cold. And feels a lot better than yesterday,her throat is still a little sore. She has not taken any medication today, she wants to know if there is anything she should avoid taking OTC for her cold? Please advise.

## 2010-12-01 NOTE — Telephone Encounter (Signed)
PT INFORMED WITH THE BELOW NOTE. 

## 2010-12-01 NOTE — Progress Notes (Signed)
Pt. Stopped by and brought me signed release form. I faxed her FMLA forms to [employer].  Original is in her hardcopy chart and there is a copy waiting to be scanned into EMR.  KA

## 2010-12-01 NOTE — Telephone Encounter (Signed)
She can take any OTC medication, but I would avoid aspirin containing.

## 2010-12-02 ENCOUNTER — Encounter (HOSPITAL_COMMUNITY): Payer: Self-pay | Admitting: Gynecology

## 2010-12-02 ENCOUNTER — Encounter (HOSPITAL_COMMUNITY): Payer: Self-pay | Admitting: Anesthesiology

## 2010-12-02 ENCOUNTER — Ambulatory Visit (HOSPITAL_COMMUNITY): Payer: 59 | Admitting: Anesthesiology

## 2010-12-02 ENCOUNTER — Encounter (HOSPITAL_COMMUNITY)
Admission: RE | Disposition: A | Discharge status: Home / Self Care | Payer: Self-pay | Source: Ambulatory Visit | Attending: Gynecology

## 2010-12-02 ENCOUNTER — Ambulatory Visit (HOSPITAL_COMMUNITY)
Admission: RE | Admit: 2010-12-02 | Discharge: 2010-12-03 | Disposition: A | Discharge status: Home / Self Care | Payer: 59 | Source: Ambulatory Visit | Attending: Gynecology | Admitting: Gynecology

## 2010-12-02 ENCOUNTER — Other Ambulatory Visit: Payer: Self-pay | Admitting: Gynecology

## 2010-12-02 DIAGNOSIS — N920 Excessive and frequent menstruation with regular cycle: Secondary | ICD-10-CM | POA: Insufficient documentation

## 2010-12-02 DIAGNOSIS — N8000 Endometriosis of the uterus, unspecified: Secondary | ICD-10-CM | POA: Insufficient documentation

## 2010-12-02 DIAGNOSIS — D259 Leiomyoma of uterus, unspecified: Secondary | ICD-10-CM

## 2010-12-02 DIAGNOSIS — Z01818 Encounter for other preprocedural examination: Secondary | ICD-10-CM | POA: Insufficient documentation

## 2010-12-02 DIAGNOSIS — N946 Dysmenorrhea, unspecified: Secondary | ICD-10-CM | POA: Insufficient documentation

## 2010-12-02 DIAGNOSIS — Z01812 Encounter for preprocedural laboratory examination: Secondary | ICD-10-CM | POA: Insufficient documentation

## 2010-12-02 HISTORY — PX: LAPAROSCOPIC VAGINAL HYSTERECTOMY: SUR798

## 2010-12-02 SURGERY — HYSTERECTOMY, VAGINAL, LAPAROSCOPY-ASSISTED
Anesthesia: General | Site: Abdomen | Wound class: Clean Contaminated

## 2010-12-02 MED ORDER — FENTANYL CITRATE 0.05 MG/ML IJ SOLN
INTRAMUSCULAR | Status: AC
  Filled 2010-12-02: qty 5

## 2010-12-02 MED ORDER — PROPOFOL 10 MG/ML IV EMUL
INTRAVENOUS | Status: AC
  Filled 2010-12-02: qty 20

## 2010-12-02 MED ORDER — KETOROLAC TROMETHAMINE 30 MG/ML IJ SOLN
INTRAMUSCULAR | Status: AC
  Filled 2010-12-02: qty 1

## 2010-12-02 MED ORDER — DIPHENHYDRAMINE HCL 50 MG/ML IJ SOLN
12.5000 mg | Freq: Once | INTRAMUSCULAR | Status: AC
  Administered 2010-12-02: 12.5 mg via INTRAVENOUS

## 2010-12-02 MED ORDER — MIDAZOLAM HCL 5 MG/5ML IJ SOLN
INTRAMUSCULAR | Status: DC | PRN
  Administered 2010-12-02 (×2): 1 mg via INTRAVENOUS

## 2010-12-02 MED ORDER — LIDOCAINE HCL (CARDIAC) 20 MG/ML IV SOLN
INTRAVENOUS | Status: DC | PRN
  Administered 2010-12-02: 80 mg via INTRAVENOUS

## 2010-12-02 MED ORDER — ROCURONIUM BROMIDE 50 MG/5ML IV SOLN
INTRAVENOUS | Status: AC
  Filled 2010-12-02: qty 1

## 2010-12-02 MED ORDER — KETOROLAC TROMETHAMINE 30 MG/ML IJ SOLN: 15.0000 mg | Freq: Once | INTRAMUSCULAR | Status: DC | PRN

## 2010-12-02 MED ORDER — MENTHOL 3 MG MT LOZG: 1.0000 | LOZENGE | OROMUCOSAL | Status: DC | PRN

## 2010-12-02 MED ORDER — HYDROMORPHONE HCL 1 MG/ML IJ SOLN
2.0000 mg | INTRAMUSCULAR | Status: DC | PRN
  Administered 2010-12-02: 1 mg via INTRAVENOUS
  Filled 2010-12-02: qty 1

## 2010-12-02 MED ORDER — SODIUM CHLORIDE 0.9 % IV SOLN: INTRAVENOUS | Status: DC

## 2010-12-02 MED ORDER — KETOROLAC TROMETHAMINE 30 MG/ML IJ SOLN
30.0000 mg | Freq: Four times a day (QID) | INTRAMUSCULAR | Status: DC
  Administered 2010-12-02 – 2010-12-03 (×3): 30 mg via INTRAVENOUS
  Filled 2010-12-02 (×3): qty 1

## 2010-12-02 MED ORDER — FENTANYL CITRATE 0.05 MG/ML IJ SOLN
INTRAMUSCULAR | Status: DC | PRN
  Administered 2010-12-02 (×4): 50 ug via INTRAVENOUS
  Administered 2010-12-02: 150 ug via INTRAVENOUS

## 2010-12-02 MED ORDER — FENTANYL CITRATE 0.05 MG/ML IJ SOLN
INTRAMUSCULAR | Status: AC
  Filled 2010-12-02: qty 2

## 2010-12-02 MED ORDER — MORPHINE SULFATE 4 MG/ML IJ SOLN: 1.0000 mg | INTRAMUSCULAR | Status: DC | PRN

## 2010-12-02 MED ORDER — ONDANSETRON HCL 4 MG/2ML IJ SOLN
INTRAMUSCULAR | Status: DC | PRN
  Administered 2010-12-02: 4 mg via INTRAVENOUS

## 2010-12-02 MED ORDER — ONDANSETRON HCL 4 MG PO TABS: 4.0000 mg | ORAL_TABLET | Freq: Four times a day (QID) | ORAL | Status: DC | PRN

## 2010-12-02 MED ORDER — HYDROMORPHONE HCL 1 MG/ML IJ SOLN
0.2500 mg | INTRAMUSCULAR | Status: DC | PRN
  Administered 2010-12-02: 0.5 mg via INTRAVENOUS

## 2010-12-02 MED ORDER — MIDAZOLAM HCL 2 MG/2ML IJ SOLN
INTRAMUSCULAR | Status: AC
  Filled 2010-12-02: qty 2

## 2010-12-02 MED ORDER — ALBUTEROL SULFATE HFA 108 (90 BASE) MCG/ACT IN AERS
2.0000 | INHALATION_SPRAY | RESPIRATORY_TRACT | Status: DC | PRN
  Filled 2010-12-02: qty 6.7

## 2010-12-02 MED ORDER — DIPHENHYDRAMINE HCL 50 MG/ML IJ SOLN
INTRAMUSCULAR | Status: AC
  Administered 2010-12-02: 12.5 mg via INTRAVENOUS
  Filled 2010-12-02: qty 1

## 2010-12-02 MED ORDER — NEOSTIGMINE METHYLSULFATE 1 MG/ML IJ SOLN
INTRAMUSCULAR | Status: DC | PRN
  Administered 2010-12-02: 1.5 mg via INTRAMUSCULAR

## 2010-12-02 MED ORDER — ONDANSETRON HCL 4 MG/2ML IJ SOLN: 4.0000 mg | Freq: Four times a day (QID) | INTRAMUSCULAR | Status: DC | PRN

## 2010-12-02 MED ORDER — GLYCOPYRROLATE 0.2 MG/ML IJ SOLN
INTRAMUSCULAR | Status: AC
  Filled 2010-12-02: qty 1

## 2010-12-02 MED ORDER — ZOLPIDEM TARTRATE 5 MG PO TABS: 5.0000 mg | ORAL_TABLET | Freq: Every evening | ORAL | Status: DC | PRN

## 2010-12-02 MED ORDER — DEXTROSE-NACL 5-0.9 % IV SOLN
INTRAVENOUS | Status: DC
  Administered 2010-12-03: 05:00:00 via INTRAVENOUS

## 2010-12-02 MED ORDER — DEXAMETHASONE SODIUM PHOSPHATE 4 MG/ML IJ SOLN
INTRAMUSCULAR | Status: DC | PRN
  Administered 2010-12-02: 6 mg via INTRAVENOUS

## 2010-12-02 MED ORDER — LIDOCAINE-EPINEPHRINE 1 %-1:100000 IJ SOLN
INTRAMUSCULAR | Status: DC | PRN
  Administered 2010-12-02: 6 mL

## 2010-12-02 MED ORDER — BUPIVACAINE HCL (PF) 0.25 % IJ SOLN
INTRAMUSCULAR | Status: DC | PRN
  Administered 2010-12-02: 5 mL

## 2010-12-02 MED ORDER — LACTATED RINGERS IR SOLN
Status: DC | PRN
  Administered 2010-12-02: 3000 mL

## 2010-12-02 MED ORDER — LACTATED RINGERS IV SOLN
INTRAVENOUS | Status: DC
  Administered 2010-12-02 (×3): via INTRAVENOUS

## 2010-12-02 MED ORDER — DIPHENHYDRAMINE HCL 25 MG PO CAPS
25.0000 mg | ORAL_CAPSULE | ORAL | Status: DC | PRN
  Filled 2010-12-02: qty 1

## 2010-12-02 MED ORDER — KETOROLAC TROMETHAMINE 30 MG/ML IJ SOLN
30.0000 mg | Freq: Four times a day (QID) | INTRAMUSCULAR | Status: DC
  Administered 2010-12-03: 30 mg via INTRAMUSCULAR
  Filled 2010-12-02: qty 1

## 2010-12-02 MED ORDER — GLYCOPYRROLATE 0.2 MG/ML IJ SOLN
INTRAMUSCULAR | Status: DC | PRN
  Administered 2010-12-02: .4 mg via INTRAVENOUS

## 2010-12-02 MED ORDER — KETOROLAC TROMETHAMINE 30 MG/ML IJ SOLN
INTRAMUSCULAR | Status: DC | PRN
  Administered 2010-12-02: 30 mg via INTRAVENOUS

## 2010-12-02 MED ORDER — PROPOFOL 10 MG/ML IV EMUL
INTRAVENOUS | Status: DC | PRN
  Administered 2010-12-02: 150 mg via INTRAVENOUS

## 2010-12-02 MED ORDER — NEOSTIGMINE METHYLSULFATE 1 MG/ML IJ SOLN
INTRAMUSCULAR | Status: AC
  Filled 2010-12-02: qty 10

## 2010-12-02 MED ORDER — LIDOCAINE HCL (CARDIAC) 20 MG/ML IV SOLN
INTRAVENOUS | Status: AC
  Filled 2010-12-02: qty 5

## 2010-12-02 MED ORDER — ROCURONIUM BROMIDE 100 MG/10ML IV SOLN
INTRAVENOUS | Status: DC | PRN
  Administered 2010-12-02: 40 mg via INTRAVENOUS

## 2010-12-02 MED ORDER — OXYCODONE-ACETAMINOPHEN 5-325 MG PO TABS
1.0000 | ORAL_TABLET | ORAL | Status: DC | PRN
  Administered 2010-12-02 – 2010-12-03 (×4): 2 via ORAL
  Filled 2010-12-02 (×4): qty 2

## 2010-12-02 MED ORDER — DEXAMETHASONE SODIUM PHOSPHATE 10 MG/ML IJ SOLN
INTRAMUSCULAR | Status: AC
  Filled 2010-12-02: qty 1

## 2010-12-02 MED ORDER — HYDROMORPHONE HCL 1 MG/ML IJ SOLN
INTRAMUSCULAR | Status: AC
  Administered 2010-12-02: 0.5 mg via INTRAVENOUS
  Filled 2010-12-02: qty 1

## 2010-12-02 MED ORDER — ONDANSETRON HCL 4 MG/2ML IJ SOLN
INTRAMUSCULAR | Status: AC
  Filled 2010-12-02: qty 2

## 2010-12-02 SURGICAL SUPPLY — 69 items
BANDAID FLEXIBLE 1X3 (GAUZE/BANDAGES/DRESSINGS) ×4 IMPLANT
BLADE SURG 10 STRL SS (BLADE) ×2 IMPLANT
BLADE SURG 15 STRL LF C SS BP (BLADE) ×2 IMPLANT
BLADE SURG 15 STRL SS (BLADE) ×3
CABLE HIGH FREQUENCY MONO STRZ (ELECTRODE) IMPLANT
CLEANER TIP ELECTROSURG 2X2 (MISCELLANEOUS) ×2 IMPLANT
CLOTH BEACON ORANGE TIMEOUT ST (SAFETY) ×3 IMPLANT
CONT PATH 16OZ SNAP LID 3702 (MISCELLANEOUS) IMPLANT
COVER LIGHT HANDLE  1/PK (MISCELLANEOUS) ×1
COVER LIGHT HANDLE 1/PK (MISCELLANEOUS) ×1 IMPLANT
COVER MAYO STAND STRL (DRAPES) ×3 IMPLANT
COVER TABLE BACK 60X90 (DRAPES) ×3 IMPLANT
DECANTER SPIKE VIAL GLASS SM (MISCELLANEOUS) ×2 IMPLANT
DEVICE SUTURE ENDOST 10MM (ENDOMECHANICALS) IMPLANT
DISSECTOR BLUNT TIP ENDO 5MM (MISCELLANEOUS) IMPLANT
DRAPE CAMERA CLOSED 9X96 (DRAPES) IMPLANT
DRAPE HYSTEROSCOPY (DRAPE) ×3 IMPLANT
DRAPE PROXIMA HALF (DRAPES) ×4 IMPLANT
DRAPE UTILITY XL STRL (DRAPES) ×3 IMPLANT
DRSG COVERLET 3X3 (GAUZE/BANDAGES/DRESSINGS) ×2 IMPLANT
ENDOSTITCH 0 SINGLE 48 (SUTURE) IMPLANT
EVACUATOR SMOKE 8.L (FILTER) ×4 IMPLANT
GAUZE SPONGE 4X4 16PLY XRAY LF (GAUZE/BANDAGES/DRESSINGS) ×2 IMPLANT
GLOVE BIO SURGEON STRL SZ7.5 (GLOVE) ×6 IMPLANT
GLOVE BIOGEL PI IND STRL 7.5 (GLOVE) ×2 IMPLANT
GLOVE BIOGEL PI INDICATOR 7.5 (GLOVE) ×1
GOWN PREVENTION PLUS LG XLONG (DISPOSABLE) ×9 IMPLANT
HARMONIC RUM II 2.5CM SILVER (DISPOSABLE)
HARMONIC RUM II 3.0CM SILVER (DISPOSABLE) ×3
HARMONIC RUM II 3.5CM SILVER (DISPOSABLE)
HARMONIC RUM II 4.0CM SILVER (DISPOSABLE)
NDL SPNL 22GX7 QUINCKE BK (NEEDLE) IMPLANT
NEEDLE SPNL 22GX7 QUINCKE BK (NEEDLE) ×3 IMPLANT
NS IRRIG 1000ML POUR BTL (IV SOLUTION) ×3 IMPLANT
OCCLUDER COLPOPNEUMO (BALLOONS) ×3 IMPLANT
PACK LAPAROSCOPY BASIN (CUSTOM PROCEDURE TRAY) ×3 IMPLANT
PENCIL BUTTON HOLSTER BLD 10FT (ELECTRODE) ×2 IMPLANT
SCALPEL HARMONIC ACE (MISCELLANEOUS) ×3 IMPLANT
SCALPEL HRMNC RUM II 2.5 SILVR (DISPOSABLE) IMPLANT
SCALPEL HRMNC RUM II 3.0 SILVR (DISPOSABLE) ×1 IMPLANT
SCALPEL HRMNC RUM II 3.5 SILVR (DISPOSABLE) IMPLANT
SCALPEL HRMNC RUM II 4.0 SILVR (DISPOSABLE) IMPLANT
SCISSORS LAP 5X35 DISP (ENDOMECHANICALS) IMPLANT
SET CYSTO W/LG BORE CLAMP LF (SET/KITS/TRAYS/PACK) IMPLANT
SET IRRIG TUBING LAPAROSCOPIC (IRRIGATION / IRRIGATOR) ×3 IMPLANT
SPONGE LAP 4X18 X RAY DECT (DISPOSABLE) ×2 IMPLANT
SUT PLAIN 4 0 FS 2 27 (SUTURE) ×6 IMPLANT
SUT VIC AB 0 CT1 18XCR BRD8 (SUTURE) ×2 IMPLANT
SUT VIC AB 0 CT1 27 (SUTURE) ×6
SUT VIC AB 0 CT1 27XBRD ANBCTR (SUTURE) ×2 IMPLANT
SUT VIC AB 0 CT1 8-18 (SUTURE) ×6
SUT VICRYL 0 TIES 12 18 (SUTURE) ×2 IMPLANT
SUT VICRYL 0 UR6 27IN ABS (SUTURE) ×3 IMPLANT
SYR 50ML LL SCALE MARK (SYRINGE) ×3 IMPLANT
SYR BULB IRRIGATION 50ML (SYRINGE) ×3 IMPLANT
SYR CONTROL 10ML LL (SYRINGE) ×2 IMPLANT
TIP UTERINE 5.1X6CM LAV DISP (MISCELLANEOUS) IMPLANT
TIP UTERINE 6.7X10CM GRN DISP (MISCELLANEOUS) ×2 IMPLANT
TIP UTERINE 6.7X6CM WHT DISP (MISCELLANEOUS) IMPLANT
TIP UTERINE 6.7X8CM BLUE DISP (MISCELLANEOUS) IMPLANT
TOWEL OR 17X24 6PK STRL BLUE (TOWEL DISPOSABLE) ×6 IMPLANT
TRAY FOLEY CATH 14FR (SET/KITS/TRAYS/PACK) ×3 IMPLANT
TROCAR BALLN 12MMX100 BLUNT (TROCAR) IMPLANT
TROCAR XCEL NON-BLD 11X100MML (ENDOMECHANICALS) ×6 IMPLANT
TROCAR XCEL NON-BLD 5MMX100MML (ENDOMECHANICALS) ×6 IMPLANT
TUBING SUCTION BULK 100 FT (MISCELLANEOUS) ×2 IMPLANT
WARMER LAPAROSCOPE (MISCELLANEOUS) ×3 IMPLANT
WATER STERILE IRR 1000ML POUR (IV SOLUTION) ×3 IMPLANT
YANKAUER SUCT BULB TIP NO VENT (SUCTIONS) ×2 IMPLANT

## 2010-12-02 NOTE — Anesthesia Preprocedure Evaluation (Signed)
Anesthesia Evaluation  Name, MR# and DOB Patient awake  General Assessment Comment  Reviewed: Allergy & Precautions, H&P , NPO status , Patient's Chart, lab work & pertinent test results  Airway Mallampati: I TM Distance: >3 FB Neck ROM: full    Dental  (+) Teeth Intact   Pulmonary    pulmonary exam normalPulmonary Exam Normal     Cardiovascular     Neuro/Psych Negative Psych ROS  GI/Hepatic/Renal   negative Liver ROS  negative Renal ROS  hiatal hernia,  GERD      Endo/Other  Negative Endocrine ROS (+)      Abdominal Normal abdominal exam  (+)   Musculoskeletal   Hematology negative hematology ROS (+)   Peds  Reproductive/Obstetrics negative OB ROS    Anesthesia Other Findings             Anesthesia Physical Anesthesia Plan  ASA: II  Anesthesia Plan: General   Post-op Pain Management:    Induction: Intravenous  Airway Management Planned: Oral ETT  Additional Equipment:   Intra-op Plan:   Post-operative Plan: Extubation in OR  Informed Consent: I have reviewed the patients History and Physical, chart, labs and discussed the procedure including the risks, benefits and alternatives for the proposed anesthesia with the patient or authorized representative who has indicated his/her understanding and acceptance.   Dental Advisory Given  Plan Discussed with: CRNA  Anesthesia Plan Comments:         Anesthesia Quick Evaluation

## 2010-12-02 NOTE — Transfer of Care (Signed)
  Anesthesia Post-op Note  Patient: [name]  Procedure(s) Performed:  LAPAROSCOPIC ASSISTED VAGINAL HYSTERECTOMY - Laparoscopic Assisted Vaginal Hysterectomy  Patient Location: PACU  Anesthesia Type: General  Level of Consciousness: awake, alert  and oriented  Airway and Oxygen Therapy: Patient Spontanous Breathing and Patient connected to nasal cannula oxygen  Post-op Pain: none  Post-op Assessment: Post-op Vital signs reviewed  Post-op Vital Signs: Reviewed and stable  Complications: No apparent anesthesia complications

## 2010-12-02 NOTE — Progress Notes (Signed)
Date of surgery  Into see patient Afebrile vital signs are stable Abdomen soft mildly tender dressings dry  Discussed results of surgery with the patient and her husband. She is doing well tolerating liquids clear yellow urine in the Foley. She'll continue routine postoperative care tentative discharge in the morning.

## 2010-12-02 NOTE — H&P (Signed)
  Dictated history and physical current.  Dara Lords MD, 7:17 AM 12/02/2010

## 2010-12-02 NOTE — Anesthesia Postprocedure Evaluation (Signed)
Anesthesia Post Note  Patient: [name]  Procedure(s) Performed:  LAPAROSCOPIC ASSISTED VAGINAL HYSTERECTOMY - Laparoscopic Assisted Vaginal Hysterectomy  Anesthesia type: General  Patient location: PACU  Post pain: Pain level controlled  Post assessment: Post-op Vital signs reviewed  Last Vitals:  Filed Vitals:   12/02/10 1255  BP:   Pulse: 74  Temp:   Resp: 15    Post vital signs: Reviewed  Level of consciousness: sedated  Complications: No apparent anesthesia complications

## 2010-12-02 NOTE — Op Note (Signed)
ADALEA HANDLER 1965/09/01 [id number]   Post Operative Note   Date of surgery:  12/02/2010  Pre Op Dx: menorrhagia, leiomyoma  Post Op Dx  same  Procedure:  Laparoscopic assisted vaginal hysterectomy, lysis of adhesions  Surgeon:  Dara Lords  Assistant:  Reynaldo Minium  Anesthesia:  General  Local Injection:  8 cc 1% lidocaine with 1-100,000 epinephrine pericervical injection, 5 cc 0.25% Marcaine skin incision injection     EBL:  Less than 100 cc  Complications:  None  Specimen:  Uterus with clinical weight 190 g to pathology   Findings: EUA:  External BUS vagina normal, cervix normal, uterus grossly normal in size midline mobile adnexa without gross masses   Operative:  Thick anterior abdominal wall adhesions along the anterior lower uterine segment, posterior cul-de-sac normal, uterus enlarged symmetrically without gross evidence of subserosal myomas, right and left fallopian tubes evidence of prior tubal sterilization otherwise normal, bilateral ovaries grossly normal free and mobile no evidence of pelvic endometriosis. Upper abdominal exam was normal noting liver smooth gallbladder grossly normal appendix not visualized.   Procedure:  Patient was taken to the operating room properly identified placed in supine position underwent general endotracheal anesthesia without difficulty. The patient was placed low dorsal lithotomy position received abdominal perineal and vaginal preparation with Betadine solution EUA performed and the cervix was visualized with weighted speculum anterior lip grasped with a single-tooth tenaculum and gently sounded and dilated. The RUMI uterine manipulator and KOH cup were sized and subsequently placed without difficulty.  Indwelling Foley catheter was then placed without difficulty. The patient was then draped in usual fashion a transverse repeat infraumbilical incision was made and using the 10 mm direct entry trocar the abdomen was directly entered  under direct visualization without difficulty. The abdomen was then insufflated right and left 5 mm suprapubic ports were then placed under direct visualization after transillumination vessels without difficulty. Examination of the pelvic organs upper abdominal exam was then carried out with findings noted above. A very thick band of adhesions secured the uterus to the anterior abdominal wall and using the harmonic scalpel this band was lysed. The bladder flap was initially developed both sharply and bluntly. The right and left round ligaments were then transected using the harmonic scalpel. The right uterine ovarian pedicle was identified and subsequently transected using the harmonic scalpel. Due to the anatomy the left uterine ovarian pedicle was not easily accessed and at this point it was felt better to proceed with a laparoscopic-assisted vaginal hysterectomy instead of a TLH. The patient was then placed in the dorsal lithotomy position the RUMI uterine manipulator and KOH Cup were then removed, the cervix visualized with a weighted speculum anterior lip grasped with a single-tooth tenaculum and the cervical mucosa was then circumferentially injected using 1% lidocaine with 1-100,000 epinephrine mixture. A total of 8 cc used. The cervical mucosa was then circumferentially incised sharply and the paracervical planes were sharply developed. The posterior cul-de-sac was sharply entered without difficulty a long weighted speculum was placed. The right and left uterosacral ligaments were clamped cut and ligated using 0 Vicryl suture and tagged for future reference. The anterior vesicouterine fold was sharply developed and ultimately the anterior cul-de-sac entered and through progressive clamping cutting and ligating of the cardinal ligaments and parametrial tissues the uterus was freed from its attachments using 0 Vicryl suture.. Due to the bulk of the uterus it was decided to morcellate to allow better  visualization and the uterus was morcellated in  several pieces to allow the fundus to be visualized. The left uterine ovarian pedicle was then doubly clamped cut and ligated doubly using 0 Vicryl suture in a simple stitch followed by suture ligature. The remaining tissues on the right fundus were clamped cut and ligated using 0 Vicryl suture. The long weighted speculum was replaced with a shorter weighted speculum the intestines packed using a tagged tail sponge and the posterior vaginal cuff was run from uterosacral ligament to uterosacral ligament using 0 Vicryl suture in a running interlocking stitch. The bowel packing was removed and the vagina was closed anterior to posterior using 0 Vicryl suture in interrupted figure-of-eight stitch. The vagina was irrigated hemostasis visualized attention was then turned to the laparoscopic portion. After regloving the abdomen was reinsufflated the pelvis copiously irrigated several small bleeding points addressed with electrocautery both ureters were identified with normal peristalsis the way from the surgical sites. The 5 mm ports were removed the gas slowly allowed to escape again hemostasis visualized the port sites and the pelvis and the umbilical port was then backed out under direct visualization showing adequate hemostasis no evidence of hernia formation. All skin incisions were injected using 0.25% Marcaine 5 cc total and the infraumbilical incision was closed using a 0 Vicryl suture in an interrupted subcutaneous fascial stitch. All skin incisions were closed using 4-0 plain interrupted sutures sterile dressings were applied the patient received intraoperative Toradol clear yellow free-flowing urine was noted and the patient was awake without difficulty taken to recovery room in good condition having tolerated the procedure well.    Dara Lords MD, 10:03 AM 12/02/2010

## 2010-12-02 NOTE — Anesthesia Procedure Notes (Signed)
Procedures

## 2010-12-03 LAB — CBC
HCT: 27.1 % — ABNORMAL LOW (ref 36.0–46.0)
Hemoglobin: 9.4 g/dL — ABNORMAL LOW (ref 12.0–15.0)
MCHC: 34.7 g/dL (ref 30.0–36.0)

## 2010-12-03 MED ORDER — OXYCODONE-ACETAMINOPHEN 5-325 MG PO TABS: 1.0000 | ORAL_TABLET | ORAL | Status: AC | PRN

## 2010-12-03 MED ORDER — ONDANSETRON HCL 4 MG PO TABS: 4.0000 mg | ORAL_TABLET | Freq: Four times a day (QID) | ORAL | Status: AC | PRN

## 2010-12-03 NOTE — Progress Notes (Signed)
Pt discharged home with husband... Condition stable... No equipment... Pt to car via wheelchair with NT, [employer].Marland KitchenMarland Kitchen

## 2010-12-03 NOTE — Progress Notes (Addendum)
Pt tolerating po meds, ambulating in halls, and voiding without difficulty. Call Dr. Audie Box with update. He stated pt may be discharged home. States he placed order for discharge earlier this morning. RN unable to see in order set, but able to print AVS.

## 2010-12-03 NOTE — Progress Notes (Signed)
[name] [birth year] [id number]   1 Day Post-Op Procedure(s) (LRB): LAPAROSCOPIC ASSISTED VAGINAL HYSTERECTOMY ()  Subjective: Patient reports feels well, pain severity reported mild, yes taking PO, foley catheter in place, , yes ambulating, yespassing flatus  Objective: Afeb, VSS   EXAM General: awake, no distress Resp: rhonchi clear to auscultation bilaterally Cardio: regular rate and rhythm, S1, S2 normal, no murmur, click, rub or gallop GI: normal findings:soft, non-tender; bowel sounds normal; no masses,  no organomegaly and incision: clean, dry and intact Lower Extremities: Without swelling or tenderness Vaginal Bleeding: Reported scant  Assessment: s/p Procedure(s): LAPAROSCOPIC ASSISTED VAGINAL HYSTERECTOMY: progressing well, Foley still in place, has not been ambulated well yet.  Taking by mouth, oral pain meds working well. Postoperative hematocrit 27.  Plan: Discharge home today after Foley discontinued voiding and ambulating.  Precautions, instructions and follow up were discussed with the patient.  Prescriptions provided  Percocet 5.0 #30 one to 2 by mouth every 6 hours when necessary pain and Zofran 4 mg #10 one by mouth Q6 hours when necessary nausea.  Patient to call the office to arrange a post-operative appointmant in 2 weeks.  Dara Lords, MD 12/03/2010 8:26 AM

## 2010-12-07 ENCOUNTER — Encounter: Payer: Self-pay | Admitting: Gynecology

## 2010-12-07 ENCOUNTER — Telehealth: Payer: Self-pay | Admitting: *Deleted

## 2010-12-07 DIAGNOSIS — G8918 Other acute postprocedural pain: Secondary | ICD-10-CM

## 2010-12-07 MED ORDER — TRAMADOL HCL 50 MG PO TABS: 50.0000 mg | ORAL_TABLET | Freq: Four times a day (QID) | ORAL | Status: DC | PRN

## 2010-12-07 NOTE — Telephone Encounter (Signed)
Pt c/o constipation no bowl movement since Wednesday before surgery. Pt had hysterectomy on 12/02/10. She said that her medication is running out. She still having discomfort from surgery. only 3 percocet left and 1 Zofran. Please advise the above.

## 2010-12-07 NOTE — Telephone Encounter (Signed)
Pt informed with the below regarding rx and to pick up otc suppository to help with constipation.

## 2010-12-07 NOTE — Telephone Encounter (Signed)
Tell patient that narcotic medication can be associated with constipation. Going to prescribe Ultram 50 mg by mouth every 6 hours #30 for pain. Recommend Dulcolax suppository as needed and she can get these OTC.

## 2010-12-13 ENCOUNTER — Encounter: Payer: Self-pay | Admitting: Gynecology

## 2010-12-14 ENCOUNTER — Encounter (HOSPITAL_COMMUNITY): Payer: Self-pay | Admitting: Gynecology

## 2010-12-14 ENCOUNTER — Telehealth: Payer: Self-pay | Admitting: *Deleted

## 2010-12-14 MED ORDER — IBUPROFEN 800 MG PO TABS: 800.0000 mg | ORAL_TABLET | Freq: Four times a day (QID) | ORAL | Status: AC | PRN

## 2010-12-14 NOTE — Telephone Encounter (Signed)
PT INFORMED WITH THE BELOW NOT RX SENT TO PHARMACY.

## 2010-12-14 NOTE — Telephone Encounter (Signed)
Since it's been 12 days since surgery I think she may be could get by on 800 mg of Motrin every 6 hours when necessary. She taken Motrin in the past with good results? If she doesn't want to do that assuming she is not allergic you could give her prescription for Tylenol #3 ( twenty)

## 2010-12-14 NOTE — Telephone Encounter (Signed)
PT HAD SURGERY ON 12/02/10 LAPAROSCOPIC HYSTERECTOMY. SHE WAS GIVEN ULTRAM FOR PAIN ON 12/07/10 BY DR.FONTAINE. PT HAS TAKEN MEDICATION AND STATES SHE HAS BAD HEADACHES FROM THIS AND WOULD LIKE SOMETHING ELSE IF POSSIBLE TO HELP WITH DISCOMFORT FROM HYSTERECTOMY.PLEASE ADVISE.

## 2010-12-21 ENCOUNTER — Ambulatory Visit (INDEPENDENT_AMBULATORY_CARE_PROVIDER_SITE_OTHER): Payer: 59 | Admitting: Gynecology

## 2010-12-21 ENCOUNTER — Encounter: Payer: Self-pay | Admitting: Gynecology

## 2010-12-21 ENCOUNTER — Telehealth: Payer: Self-pay | Admitting: *Deleted

## 2010-12-21 VITALS — BP 118/76

## 2010-12-21 DIAGNOSIS — R82998 Other abnormal findings in urine: Secondary | ICD-10-CM

## 2010-12-21 DIAGNOSIS — R300 Dysuria: Secondary | ICD-10-CM

## 2010-12-21 DIAGNOSIS — Z09 Encounter for follow-up examination after completed treatment for conditions other than malignant neoplasm: Secondary | ICD-10-CM

## 2010-12-21 NOTE — Telephone Encounter (Signed)
Patient informed information below.  Patient wants to be seen today.  Appointments will schedule.

## 2010-12-21 NOTE — Telephone Encounter (Signed)
I would recommend pushing fluids to not worry about eating solid foods but just fluids. Also I would try a Dulcolax suppository to see if we can't accelerate her bowel movements. Otherwise I think she can wait till tomorrow unless she feels she needs to be seen today and I am more than happy to see her this afternoon. I leave that decision up to her.

## 2010-12-21 NOTE — Progress Notes (Signed)
Patient presents post operative status post LAVH 12/02/2010. Patient did well initially but notes over the last several days increasing lower become more discomfort some mild dysuria and difficulty having a bowel movement. She's had one bowel movement since surgery has tried a Dulcolax suppository and some oral laxatives without much benefit. She has no fevers or chills she is drinking freely without any problems she is eating although she's not really hungry.  Exam Abdomen: Soft active bowel sounds nontender without masses guarding rebound organomegaly incisions healed nicely Pelvic: External BUS vagina normal cuff healing nicely bimanual without masses or significant tenderness  Assessment and plan: 2 weeks postop status post LAVH lower abdominal discomfort which I think is secondary to her constipation.  UA does show mild changes suggestive of a low-grade UTI. Going to cover her with ciprofloxacin 250 twice a day x7 days given and she was catheterized. Encouraged increased fluid intake a trial of Dulcolax suppository. Hold on solid food until having a bowel movement. I refilled her Lortab 5.0 #20 one to 2 by mouth Q6 hours when necessary pain. ASAP call precautions were reviewed with her. Assuming she starts having bowel movements and doing well she will see me in 2 weeks sooner if any problems.

## 2010-12-21 NOTE — Telephone Encounter (Signed)
Patient called c/o several issues.  Patient has had a great amount of back and stomach pain.  Urinating is a little painful.  Temp 99.8.  Feels like she has the flu.  Has been nauseated and has no appetite.  Also, no bowel movement since Friday.  Has post op appointment tomorrow.  Do we need to move her up? Or prescribe something to hold her over till tomorrow? Please advise.

## 2010-12-22 ENCOUNTER — Encounter: Payer: Self-pay | Admitting: Gynecology

## 2010-12-22 ENCOUNTER — Ambulatory Visit: Payer: 59 | Admitting: Gynecology

## 2010-12-23 ENCOUNTER — Telehealth: Payer: Self-pay

## 2010-12-23 NOTE — Telephone Encounter (Signed)
[name] called to see if Dr. Audie Box would be willing to extend her disability. She is scheduled to return to work next Friday, Sept 28 and that is exactly 4 weeks p.op  She was in 2 days ago with constipation (she reported only one BM since surgery).  Was having some discomfort. I asked [name] and she still has not had a bowel movement.  She said she has used the Dulcolax suppositories and drinking plenty of fluids.  She said she ate today but vomited.  I told her that Dr. Kristie Cowman note recommended liquid diet until bowel movement.  [name] has appointment for follow up on Monday October 1st and she would like to have her disability extended until at least that day. Please advise.

## 2010-12-23 NOTE — Telephone Encounter (Signed)
Patient informed Dr. Velvet Bathe okay with extending leave. We will plan on her returning to work on Oct 2nd after her office visit unless at this visit there is an indicated need to extend disability further.

## 2010-12-23 NOTE — Telephone Encounter (Signed)
Sounds reasonable.

## 2010-12-30 NOTE — Progress Notes (Signed)
I faxed op note and office note from 12/21/10 to [facility] [employer]  Per Dr. Glenetta Hew pt's disability extended until after her office visit on Monday, Oct 1.  We will plan on her returning to work Jan 04, 2011 and this was also relayed to [employer]. With these forms. ka

## 2011-01-03 ENCOUNTER — Ambulatory Visit (INDEPENDENT_AMBULATORY_CARE_PROVIDER_SITE_OTHER): Payer: 59 | Admitting: Gynecology

## 2011-01-03 VITALS — BP 118/64

## 2011-01-03 DIAGNOSIS — M545 Low back pain: Secondary | ICD-10-CM

## 2011-01-03 DIAGNOSIS — Z9889 Other specified postprocedural states: Secondary | ICD-10-CM

## 2011-01-03 MED ORDER — IBUPROFEN 800 MG PO TABS: 800.0000 mg | ORAL_TABLET | Freq: Three times a day (TID) | ORAL | Status: AC | PRN

## 2011-01-03 NOTE — Progress Notes (Signed)
Patient presents four-week postop check status post LAVH doing well. Having some low back pain on and off.    Exam Spine: Straight no CVA tenderness or paraspinous muscle spasm Abdomen incisions healed nicely without masses tenderness Pelvic external BUS vagina and normal cuff healed nicely bimanual without masses or tenderness  Assessment and plan: Status post LAVH doing well slowly resume all normal activities. She is going back to work in several days. We'll continue pelvic rest for another 2 weeks to complete a six-week abstinence. I did give her prescription for Motrin 800 mg one by mouth every 8 hours when necessary pain #30 refill x1 for her low back discomfort. Also ordered a urinalysis just to make sure that this is normal. Otherwise assuming she does well then she'll see me when she's due for her annual sooner as needed.

## 2011-01-06 ENCOUNTER — Telehealth: Payer: Self-pay | Admitting: *Deleted

## 2011-01-06 NOTE — Telephone Encounter (Signed)
Pt is status post LAVH she returned back to work yesterday. She is working from home now which requires long periods of sitting. Pt says she has cramping in her stomach and lower back pain. Dr.F gave a rx for motrin 800mg  (on 01/03/11) helps with lower back not cramping. She says it feels like something pull inside? Pt doesn't know what else could be done regarding cramping. Please advise

## 2011-01-06 NOTE — Telephone Encounter (Signed)
NOTE FAXED PER REQUEST TO [phone]

## 2011-01-06 NOTE — Telephone Encounter (Signed)
Telephone call, states the Motrin has helped with back pain, but still has abdominal cramping especially with prolonged sitting. Requests a note to excuse her from prolonged sitting at work. Will fax a note concerning restrictions that will states needs to be able to walk and rest every 2 hours, no prolonged sitting greater than 2 hours for at least 2 weeks. Denies constipation, states is doing better except with prolonged sitting.

## 2011-01-13 LAB — POCT HEMOGLOBIN-HEMACUE: Hemoglobin: 11.6 — ABNORMAL LOW

## 2011-01-14 ENCOUNTER — Ambulatory Visit (INDEPENDENT_AMBULATORY_CARE_PROVIDER_SITE_OTHER): Payer: 59 | Admitting: Family Medicine

## 2011-01-14 ENCOUNTER — Encounter: Payer: Self-pay | Admitting: Family Medicine

## 2011-01-14 VITALS — BP 102/70 | HR 79 | Temp 98.7°F | Ht 62.5 in | Wt 150.8 lb

## 2011-01-14 DIAGNOSIS — J069 Acute upper respiratory infection, unspecified: Secondary | ICD-10-CM

## 2011-01-14 LAB — URINE MICROSCOPIC-ADD ON

## 2011-01-14 LAB — URINALYSIS, ROUTINE W REFLEX MICROSCOPIC
Bilirubin Urine: NEGATIVE
Leukocytes, UA: NEGATIVE
Nitrite: NEGATIVE
Specific Gravity, Urine: 1.016
pH: 6

## 2011-01-14 LAB — POCT PREGNANCY, URINE: Operator id: 198171

## 2011-01-14 MED ORDER — CHLORPHENIRAMINE-HYDROCODONE 8-10 MG/5ML PO LQCR: 5.0000 mL | Freq: Two times a day (BID) | ORAL | Status: DC | PRN

## 2011-01-14 NOTE — Patient Instructions (Signed)
Nice to meet you and I hope you feel better soon!    Drink lots of fluids.  Treat sympotmatically with Mucinex, nasal saline irrigation, and Tylenol/Ibuprofen.Cough suppressant at night. Call if not improving as expected in 5-7 days.

## 2011-01-14 NOTE — Progress Notes (Signed)
SUBJECTIVE:  [name] is a 45 y.o. female who complains of coryza, laryngitis and dry cough for 2 days. She denies a history of chest pain, dizziness, myalgias and shortness of breath and denies a history of asthma.   S/p hysterectomy on 8/30 so cough is making her abdominal muscles hurt.     Patient Active Problem List  Diagnoses  . FIBROIDS, UTERUS  . ANEMIA, IRON DEFICIENCY  . ALLERGIC RHINITIS  . GERD  . MENORRHAGIA  . POSITIVE PPD  . RENAL CALCULUS, HX OF  . Routine general medical examination at a health care facility  . Other screening mammogram  . Frequent urination  . Gynecological examination   Past Medical History  Diagnosis Date  . Anemia     iron deficiency anemia  . Sickle cell trait   . Fibroid uterus     painful menses  . Menorrhagia   . Allergy     allergic rhinitis  . Personal history of kidney stones   . PPD positive     Hx of  . HPV in female     also some abnormal paps in past  . GERD (gastroesophageal reflux disease)   . Hiatal hernia    Past Surgical History  Procedure Date  . Breast surgery     removal of breast mass / benign /age 39-20  . Cesarean section     x3  . Tubal ligation     BTL  . Tonsillectomy and adenoidectomy     age 45  . Appendectomy     6  . Pelvic laparoscopy     x2 for cysts r/o endometriosis  . Laparoscopic vaginal hysterectomy 12/02/2010    menorrhagia, leiomyomata   History  Substance Use Topics  . Smoking status: Never Smoker   . Smokeless tobacco: Never Used  . Alcohol Use: No   Family History  Problem Relation Age of Onset  . Cancer Maternal Aunt     breast CA  . Cancer Paternal Aunt     uterine CA  . Lupus Cousin   . Anesthesia problems Mother    Allergies  Allergen Reactions  . Morphine     REACTION: itch   Current Outpatient Prescriptions on File Prior to Visit  Medication Sig Dispense Refill  . albuterol (PROAIR HFA) 108 (90 BASE) MCG/ACT inhaler Inhale 2 puffs into the lungs every 4  (four) hours as needed.        . diphenhydrAMINE (BENADRYL) 25 mg capsule Take 25 mg by mouth as needed. itching       . ferrous fumarate (HEMOCYTE) 325 (106 FE) MG TABS Take 1 tablet by mouth 2 (two) times daily.        Marland Kitchen ibuprofen (ADVIL,MOTRIN) 800 MG tablet Take 1 tablet (800 mg total) by mouth every 8 (eight) hours as needed for pain.  30 tablet  1  . loratadine (CLARITIN) 10 MG tablet Take 10 mg by mouth daily as needed.        . Multiple Vitamins-Minerals (BALANCED CARE PO) Take 1 tablet by mouth daily.         The PMH, PSH, Social History, Family History, Medications, and allergies have been reviewed in [facility], and have been updated if relevant.  OBJECTIVE: LMP 11/24/2010 BP 102/70  Pulse 79  Temp(Src) 98.7 F (37.1 C) (Oral)  Ht 5' 2.5" (1.588 m)  Wt 150 lb 12 oz (68.38 kg)  BMI 27.13 kg/m2  LMP 11/24/2010  She appears well, vital signs  are as noted. Ears normal.  Throat and pharynx normal.  Neck supple. No adenopathy in the neck. Nose is congested. Sinuses non tender. The chest is clear, without wheezes or rales.  ASSESSMENT:  viral upper respiratory illness  PLAN: Symptomatic therapy suggested: push fluids, rest and return office visit prn if symptoms persist or worsen. Lack of antibiotic effectiveness discussed with her. Call or return to clinic prn if these symptoms worsen or fail to improve as anticipated.

## 2011-01-18 ENCOUNTER — Encounter: Payer: Self-pay | Admitting: Family Medicine

## 2011-01-18 ENCOUNTER — Ambulatory Visit (INDEPENDENT_AMBULATORY_CARE_PROVIDER_SITE_OTHER): Payer: 59 | Admitting: Family Medicine

## 2011-01-18 VITALS — BP 124/70 | HR 84 | Temp 97.9°F | Ht 62.5 in | Wt 150.2 lb

## 2011-01-18 DIAGNOSIS — J40 Bronchitis, not specified as acute or chronic: Secondary | ICD-10-CM

## 2011-01-18 MED ORDER — AZITHROMYCIN 250 MG PO TABS: ORAL_TABLET | ORAL | Status: AC

## 2011-01-18 MED ORDER — BENZONATATE 200 MG PO CAPS: 200.0000 mg | ORAL_CAPSULE | Freq: Three times a day (TID) | ORAL | Status: AC | PRN

## 2011-01-18 MED ORDER — CHLORPHENIRAMINE-HYDROCODONE 8-10 MG/5ML PO LQCR: 5.0000 mL | Freq: Two times a day (BID) | ORAL | Status: DC | PRN

## 2011-01-18 NOTE — Progress Notes (Signed)
Subjective:    Patient ID: [name], female    DOB: [birth year], 45 y.o.   MRN: [id number]  HPI [name] symptoms for 2 weeks  Seen on the 12th - dx with viral uri  Given tussionex for cough --took 2 teaspoons every 12 hours   Cough is persistant - was originally prod yellow sputum and brown Now is dry and driving her crazy - especially at night - up all night  No fever  Some wheezing  No sob  Chest is sore from cough   In am- some nasal symptoms - better thru the day  Had a hyst on sept 30th - worried the cough would hurt her incision   Patient Active Problem List  Diagnoses  . FIBROIDS, UTERUS  . ANEMIA, IRON DEFICIENCY  . ALLERGIC RHINITIS  . GERD  . MENORRHAGIA  . POSITIVE PPD  . RENAL CALCULUS, HX OF  . Routine general medical examination at a health care facility  . Other screening mammogram  . Frequent urination  . Gynecological examination  . Bronchitis   Past Medical History  Diagnosis Date  . Anemia     iron deficiency anemia  . Sickle cell trait   . Fibroid uterus     painful menses  . Menorrhagia   . Allergy     allergic rhinitis  . Personal history of kidney stones   . PPD positive     Hx of  . HPV in female     also some abnormal paps in past  . GERD (gastroesophageal reflux disease)   . Hiatal hernia    Past Surgical History  Procedure Date  . Breast surgery     removal of breast mass / benign /age 15-20  . Cesarean section     x3  . Tubal ligation     BTL  . Tonsillectomy and adenoidectomy     age 82  . Appendectomy     6  . Pelvic laparoscopy     x2 for cysts r/o endometriosis  . Laparoscopic vaginal hysterectomy 12/02/2010    menorrhagia, leiomyomata   History  Substance Use Topics  . Smoking status: Never Smoker   . Smokeless tobacco: Never Used  . Alcohol Use: No   Family History  Problem Relation Age of Onset  . Cancer Maternal Aunt     breast CA  . Cancer Paternal Aunt     uterine CA  . Lupus Cousin   . Anesthesia  problems Mother    Allergies  Allergen Reactions  . Morphine     REACTION: itch   Current Outpatient Prescriptions on File Prior to Visit  Medication Sig Dispense Refill  . albuterol (PROAIR HFA) 108 (90 BASE) MCG/ACT inhaler Inhale 2 puffs into the lungs every 4 (four) hours as needed.        . diphenhydrAMINE (BENADRYL) 25 mg capsule Take 25 mg by mouth as needed. itching       . ferrous fumarate (HEMOCYTE) 325 (106 FE) MG TABS Take 1 tablet by mouth 2 (two) times daily.        . Multiple Vitamins-Minerals (BALANCED CARE PO) Take 1 tablet by mouth daily.             Review of Systems Review of Systems  Constitutional: Negative for fever, appetite change, fatigue and unexpected weight change.  Eyes: Negative for pain and visual disturbance.  ENT pos for am congestion , neg for st or sinus pain  Respiratory: neg for  sob or wheeze/ pos for cough    Cardiovascular: Negative for cp or palpitations    Gastrointestinal: Negative for nausea, diarrhea and constipation.  Genitourinary: Negative for urgency and frequency.  Skin: Negative for pallor or rash   Neurological: Negative for weakness, light-headedness, numbness and headaches.  Hematological: Negative for adenopathy. Does not bruise/bleed easily.  Psychiatric/Behavioral: Negative for dysphoric mood. The patient is not nervous/anxious.          Objective:   Physical Exam  Constitutional: She appears well-developed and well-nourished. No distress.  HENT:  Head: Normocephalic and atraumatic.  Right Ear: External ear normal.  Left Ear: External ear normal.       Boggy nares with some clear post nasal drip   Eyes: Conjunctivae and EOM are normal. Pupils are equal, round, and reactive to light. Right eye exhibits no discharge. Left eye exhibits no discharge.  Neck: Normal range of motion. Neck supple. No JVD present. Carotid bruit is not present. No thyromegaly present.  Cardiovascular: Normal rate, regular rhythm, normal heart  sounds and intact distal pulses.   Pulmonary/Chest: Effort normal and breath sounds normal. No respiratory distress. She has no wheezes. She exhibits no tenderness.       Harsh bs throughout , no rales  occ scant rhonchi  No wheeze   Abdominal: Soft. Bowel sounds are normal. There is no tenderness.  Lymphadenopathy:    She has no cervical adenopathy.  Neurological: She is alert.  Skin: Skin is warm and dry. No rash noted. No erythema. No pallor.  Psychiatric: She has a normal mood and affect.          Assessment & Plan:

## 2011-01-18 NOTE — Patient Instructions (Signed)
Drink lots of fluids and try to get some rest  Try the tessalon for cough  Use tussionex as needed -- do not exceed dosing directions  Take zpak as directed for bronchitis Update me if worse or not improving in a week or if fever or other new symptoms

## 2011-01-20 NOTE — Assessment & Plan Note (Signed)
Worsening symptoms now for almost 2 weeks - with bad prod cough Will cover with zithromax Adv fluids and rest  tussionex for severe night cough Tessalon prn day time Update if worse or no imp in 1 wk

## 2011-02-18 ENCOUNTER — Encounter: Payer: Self-pay | Admitting: Family Medicine

## 2011-02-18 ENCOUNTER — Ambulatory Visit (INDEPENDENT_AMBULATORY_CARE_PROVIDER_SITE_OTHER): Payer: 59 | Admitting: Family Medicine

## 2011-02-18 VITALS — BP 138/84 | HR 80 | Temp 98.6°F | Wt 154.2 lb

## 2011-02-18 DIAGNOSIS — R109 Unspecified abdominal pain: Secondary | ICD-10-CM

## 2011-02-18 DIAGNOSIS — R350 Frequency of micturition: Secondary | ICD-10-CM

## 2011-02-18 DIAGNOSIS — R1013 Epigastric pain: Secondary | ICD-10-CM | POA: Insufficient documentation

## 2011-02-18 LAB — POCT URINALYSIS DIPSTICK
Nitrite, UA: NEGATIVE
Protein, UA: NEGATIVE
Urobilinogen, UA: 0.2
pH, UA: 6.5

## 2011-02-18 LAB — COMPREHENSIVE METABOLIC PANEL
AST: 16 U/L (ref 0–37)
Alkaline Phosphatase: 65 U/L (ref 39–117)
BUN: 13 mg/dL (ref 6–23)
Creat: 0.8 mg/dL (ref 0.50–1.10)

## 2011-02-18 MED ORDER — TRAMADOL HCL 50 MG PO TABS: 50.0000 mg | ORAL_TABLET | Freq: Three times a day (TID) | ORAL | Status: AC | PRN

## 2011-02-18 NOTE — Progress Notes (Signed)
  Subjective:    Patient ID: [name], female    DOB: 1966/03/17, 45 y.o.   MRN: [id number]  HPI CC: abd/back pain  Stomach and back hurting.  Upper abd pain, occasional lower abd pain.  Dull achey pain associated with nausea.  + bloated and gassy.  Crescendo pain.  Worse with any food, takes a few bites and feels nauseated.  Significant pain.  Painful to swallow as well.  Going on for last 2 1/2 mo.  Has been taking ibuprofen for pain, helps some.  Pain can come on at any time, usually after eating.  Got worse yesterday.  Back pain bilateral mid back both sides.  + more frequent urination.  No fevers/chills, vomiting, diarrhea, constipation, dysuria, urgency.  No blood in stool or urine.  Last bm yesterday afternoon, normal.  No EtOH.  No smoking. H/o GERD, stays away from fried foods.  Stays away from dairy products. H/o kidney stones in past, last was several years ago. H/o hysterectomy for fibroids 12/02/2010.  Ovaries remained.  Since then started having abd pain. Has also had 3 C/S and 2 laparoscopic GYN surgeries.  Also had appendix removed. Gall bladder still present.  Also endorsing bruise left leg, as well as ache anterior left leg.  No calf pain.  No swelling.  No hormonal meds for now.  No recent immobility.  No fmhx blood clots, personal hx blood clots.  Review of Systems Per HPI    Objective:   Physical Exam  Nursing note and vitals reviewed. Constitutional: She appears well-developed and well-nourished. No distress.  HENT:  Head: Normocephalic and atraumatic.  Mouth/Throat: No oropharyngeal exudate.  Eyes: Conjunctivae and EOM are normal. Pupils are equal, round, and reactive to light.  Neck: Normal range of motion. Neck supple.  Cardiovascular: Normal rate, regular rhythm, normal heart sounds and intact distal pulses.   No murmur heard. Pulmonary/Chest: Effort normal and breath sounds normal. No respiratory distress. She has no wheezes. She has no rales.  Abdominal:  Soft. Bowel sounds are normal. She exhibits no distension and no mass. There is no hepatosplenomegaly. There is tenderness (moderate) in the right upper quadrant and epigastric area. There is no rigidity, no guarding, no CVA tenderness, no tenderness at McBurney's point and negative Murphy's sign. Hernia confirmed negative in the ventral area.  Musculoskeletal: She exhibits no edema.  Lymphadenopathy:    She has no cervical adenopathy.  Skin: Skin is warm and dry. No rash noted.  Psychiatric: She has a normal mood and affect.      Assessment & Plan:

## 2011-02-18 NOTE — Patient Instructions (Addendum)
Urine looking ok today - will send culture to be sure. blood work today to check on pancreas, liver, gallbladder, and evidence of infection. We will set you up with abdominal ultrasound for next week. Take pain medicine - tramadol as needed. Bland diet for next few days, push plenty of fluid. If fever >101, unable to keep food down, or pain not controlled by medicine, please seek urgent care over weekend.

## 2011-02-18 NOTE — Assessment & Plan Note (Signed)
UA overall seems ok, pt endorsing sxs similar to UTI in past so will send UCx to ensure not UTI.

## 2011-02-18 NOTE — Assessment & Plan Note (Addendum)
Going on since hysterectomy 12/02/2010, recently worsening. Does have h/o several abd surgeries.  ?adhesions. abd exam not acute today. Will check blood work today, set up with abd [place] for next week to eval gallbladder (worse with food, perisurgical sxs). Red flags to seek care over weekend discussed. Treat pain with tramadol for now.

## 2011-02-19 LAB — CBC WITH DIFFERENTIAL/PLATELET
Basophils Relative: 0 % (ref 0–1)
Eosinophils Absolute: 0.3 10*3/uL (ref 0.0–0.7)
HCT: 36.6 % (ref 36.0–46.0)
Hemoglobin: 12.5 g/dL (ref 12.0–15.0)
Lymphs Abs: 3.4 10*3/uL (ref 0.7–4.0)
MCH: 25.2 pg — ABNORMAL LOW (ref 26.0–34.0)
MCHC: 34.2 g/dL (ref 30.0–36.0)
Monocytes Absolute: 0.7 10*3/uL (ref 0.1–1.0)
Monocytes Relative: 7 % (ref 3–12)
Neutro Abs: 5.4 10*3/uL (ref 1.7–7.7)

## 2011-02-20 LAB — URINE CULTURE
Colony Count: NO GROWTH
Organism ID, Bacteria: NO GROWTH

## 2011-02-22 ENCOUNTER — Ambulatory Visit
Admission: RE | Admit: 2011-02-22 | Discharge: 2011-02-22 | Disposition: A | Discharge status: Home / Self Care | Payer: 59 | Source: Ambulatory Visit | Attending: Family Medicine | Admitting: Family Medicine

## 2011-02-22 DIAGNOSIS — R109 Unspecified abdominal pain: Secondary | ICD-10-CM

## 2011-02-28 ENCOUNTER — Telehealth: Payer: Self-pay | Admitting: *Deleted

## 2011-02-28 NOTE — Telephone Encounter (Signed)
Advised pt of abd [place] results.  She says symptoms have not improved, every time she eats she bends over in pain.  She knows she has hiatal hernia and reflux.

## 2011-03-01 MED ORDER — OMEPRAZOLE 40 MG PO CPDR: 40.0000 mg | DELAYED_RELEASE_CAPSULE | Freq: Every day | ORAL | Status: DC

## 2011-03-01 NOTE — Telephone Encounter (Signed)
This could be due to dyspepsia (reflux vs gastritis).  Did tramadol help pain? I would like to start her on PPI - omeprazole 40mg  daily for next 3-4 wks (has she taken before?), may refer to GI if pt interested. Sent omeprazole in.

## 2011-03-01 NOTE — Telephone Encounter (Signed)
Noted. Thanks.

## 2011-03-01 NOTE — Telephone Encounter (Signed)
Patient notified. She said the tramadol helped some but not much. She will try the omeprazole and if no improvement, then she would like GI referral. She will give [place] an update in a couple of weeks.

## 2011-03-02 ENCOUNTER — Telehealth: Payer: Self-pay | Admitting: *Deleted

## 2011-03-02 NOTE — Telephone Encounter (Signed)
Message left for patient advising to give omeprazole more time if she was willing. Also advised we could do a GI referral if she would rather do that. Advised to call and let me know what she prefers to do. Will await return call.

## 2011-03-02 NOTE — Telephone Encounter (Signed)
Message copied by Sueanne Margarita on Wed Mar 02, 2011 11:21 AM ------      Message from: Eustaquio Boyden      Created: Tue Feb 22, 2011 11:01 PM       Please notify ultrasound returned negative for gallbladder problems.      Liver, pancreas, spleen normal.  Possible small left kidney stone but doubt causing these sxs.      If sxs not improving, to let us know.

## 2011-03-02 NOTE — Telephone Encounter (Signed)
Not feeling better, back is hurting worse than the reflux, patient states the Tramadol only works a "little bit",  please advise.

## 2011-03-02 NOTE — Telephone Encounter (Signed)
Would like to give omeprazole more time.  Alternatively can set up referral to GI to further eval abd sxs (which could be referring to back causing pain)

## 2011-03-07 ENCOUNTER — Other Ambulatory Visit: Payer: Self-pay | Admitting: *Deleted

## 2011-03-07 MED ORDER — TRAMADOL HCL 50 MG PO TABS: 50.0000 mg | ORAL_TABLET | Freq: Four times a day (QID) | ORAL | Status: DC | PRN

## 2011-03-07 NOTE — Telephone Encounter (Signed)
Received faxed refill request from pharmacy for Tramadol 50 mg, take one by mouth every eight hours as needed for pain LR 02/18/11. Medication is not on medication sheet.  Is it okay to refill medication. [name] [employer]

## 2011-03-07 NOTE — Telephone Encounter (Signed)
Ok to refill.  Sent in.

## 2011-03-09 ENCOUNTER — Other Ambulatory Visit: Payer: Self-pay | Admitting: Family Medicine

## 2011-03-10 NOTE — Telephone Encounter (Signed)
[name] request refill Ferrocite 32 mg #60 x0.

## 2011-03-10 NOTE — Telephone Encounter (Signed)
[name] refill Ferrocite 324 mg #60

## 2011-03-14 ENCOUNTER — Other Ambulatory Visit: Payer: Self-pay | Admitting: Internal Medicine

## 2011-03-14 NOTE — Telephone Encounter (Signed)
Left vm for pt to callback 

## 2011-03-14 NOTE — Telephone Encounter (Signed)
Looks like she was given tussionex in oct - that was a while ago  That is habit forming med - cannot refil Also if still coughing that bad really needs to be seen - please schedule

## 2011-03-14 NOTE — Telephone Encounter (Signed)
Patient is requesting a refill on cough medication.  Please advise

## 2011-03-15 NOTE — Telephone Encounter (Signed)
Patient notified as instructed by telephone. Pt will call in AM to schedule appt.

## 2011-04-04 ENCOUNTER — Other Ambulatory Visit: Payer: Self-pay | Admitting: Family Medicine

## 2011-04-04 NOTE — Telephone Encounter (Signed)
Will refill electronically  

## 2011-04-04 NOTE — Telephone Encounter (Signed)
OK to refill

## 2011-04-05 DIAGNOSIS — N200 Calculus of kidney: Secondary | ICD-10-CM

## 2011-04-05 HISTORY — DX: Calculus of kidney: N20.0

## 2011-04-06 ENCOUNTER — Telehealth: Payer: Self-pay | Admitting: Internal Medicine

## 2011-04-06 NOTE — Telephone Encounter (Signed)
Appt made

## 2011-04-08 ENCOUNTER — Ambulatory Visit: Payer: 59 | Admitting: Family Medicine

## 2011-04-12 ENCOUNTER — Ambulatory Visit (INDEPENDENT_AMBULATORY_CARE_PROVIDER_SITE_OTHER): Payer: 59 | Admitting: Family Medicine

## 2011-04-12 ENCOUNTER — Encounter: Payer: Self-pay | Admitting: Family Medicine

## 2011-04-12 VITALS — BP 122/82 | HR 76 | Temp 98.3°F | Wt 148.8 lb

## 2011-04-12 DIAGNOSIS — R300 Dysuria: Secondary | ICD-10-CM

## 2011-04-12 DIAGNOSIS — M79609 Pain in unspecified limb: Secondary | ICD-10-CM

## 2011-04-12 DIAGNOSIS — M79605 Pain in left leg: Secondary | ICD-10-CM

## 2011-04-12 DIAGNOSIS — N390 Urinary tract infection, site not specified: Secondary | ICD-10-CM | POA: Insufficient documentation

## 2011-04-12 LAB — POCT URINALYSIS DIPSTICK
Glucose, UA: NEGATIVE
Nitrite, UA: NEGATIVE
Urobilinogen, UA: 0.2

## 2011-04-12 MED ORDER — CIPROFLOXACIN HCL 500 MG PO TABS: 500.0000 mg | ORAL_TABLET | Freq: Two times a day (BID) | ORAL | Status: AC

## 2011-04-12 MED ORDER — NAPROXEN 500 MG PO TABS: ORAL_TABLET | ORAL | Status: AC

## 2011-04-12 NOTE — Progress Notes (Signed)
  Subjective:    Patient ID: [name], female    DOB: Dec 15, 1965, 46 y.o.   MRN: [id number]  HPI CC: left leg pain and ?uti sxs  3d h/o dysuria as well as burning with wiping.  Endorsing urgency, frequency.  + R flank pain that radiates to left side as well since prior to dysuria.  No fevers/chills, nausea/vomiting.  Seen here 02/2011 with abd pain, omeprazole resolved this.  abd US WNL x small kidney stone on left side and small renal cyst.  Also having left leg cramping worse at night that wakes her up, has been going on for ~2 months.  Having pain from anterior thigh down to foot.  "cramping achey pain like tooth ache".  Also occasionally with pain posterior lower back - not recently.  + anterior thigh tingling occasionally.  No bowel/bladder accidents.  No fevers/chills.  Denies numbness of leg.  No lower back pain.  No h/o back problems or leg problems in past.  No vag rash, discharge.  When wiping upon urination, noted some blood today.  Wants to feel better because wants to run 2 marathons this year.  Review of Systems Per HPI    Objective:   Physical Exam  Nursing note and vitals reviewed. Constitutional: She appears well-developed and well-nourished. No distress.  HENT:  Head: Normocephalic and atraumatic.  Mouth/Throat: Oropharynx is clear and moist. No oropharyngeal exudate.  Neck: Normal range of motion. Neck supple.  Cardiovascular: Normal rate, regular rhythm, normal heart sounds and intact distal pulses.   No murmur heard. Pulmonary/Chest: Effort normal and breath sounds normal. No respiratory distress. She has no wheezes. She has no rales.  Abdominal: Soft. Bowel sounds are normal. She exhibits no distension and no mass. There is no hepatosplenomegaly. There is tenderness (mild pressure) in the suprapubic area. There is no rebound, no guarding and no CVA tenderness.  Musculoskeletal: She exhibits no edema.       No pain midline spine No paraspinous mm  tenderness Neg SLR bilaterally. No pain with int/ext rotation at hip. Neg FABER. No pain at SIJ, GTB or sciatic notch bilaterally.  Neurological: She has normal strength. No sensory deficit.       DTRs equal bilaterally  Skin: Skin is warm and dry. No rash noted.  Psychiatric: She has a normal mood and affect.       Assessment & Plan:

## 2011-04-12 NOTE — Patient Instructions (Addendum)
We have checked urine for infection - looks positive.  Will treat with cipro twice daily for 5 days. For leg - treat with naprosyn 500mg  twice daily with food for 5 days then as needed (don't take with ibuprofen as same class of medicines). If not improving let us know. Good to see you today, call us with questions.

## 2011-04-12 NOTE — Assessment & Plan Note (Signed)
UA and micro suspicious for UTI so will treat with cipro bid x 5 days. UCx sent.

## 2011-04-12 NOTE — Assessment & Plan Note (Addendum)
Exam normal. Story ?radiculopathy from lumbar spine. No red flags. Treat for now with naprosyn, ice.  If not improving, consider starting with imaging of lower back.

## 2011-04-14 LAB — URINE CULTURE: Organism ID, Bacteria: NO GROWTH

## 2011-04-17 ENCOUNTER — Telehealth: Payer: Self-pay | Admitting: Family Medicine

## 2011-04-17 DIAGNOSIS — R109 Unspecified abdominal pain: Secondary | ICD-10-CM

## 2011-04-17 NOTE — Telephone Encounter (Signed)
please notify urine culture returned negative for infection causing symptoms.  How is she feeling after antibiotics? As no evident infection, I would like to refer to urology for further evaluation of symptoms, and of blood in urine.  (they may want to do further imaging or studies). Placed order in chart.  Please fax office note and culture as well as recent abd [place]. Please let me know if questions.

## 2011-04-18 NOTE — Telephone Encounter (Signed)
Patient notified. She is agreeable to urology referral. She said she is feeling better, but that her urine is still dark. I advised to expect call from [facility] about uro referral. She advised to call her work # first and then her cell (both numbers in system).

## 2011-05-02 ENCOUNTER — Encounter: Payer: Self-pay | Admitting: Family Medicine

## 2011-05-06 ENCOUNTER — Other Ambulatory Visit: Payer: Self-pay | Admitting: Family Medicine

## 2011-05-06 NOTE — Telephone Encounter (Signed)
Will refill electronically  

## 2011-05-23 ENCOUNTER — Other Ambulatory Visit: Payer: Self-pay | Admitting: Family Medicine

## 2011-06-01 ENCOUNTER — Other Ambulatory Visit: Payer: Self-pay | Admitting: Family Medicine

## 2011-06-01 NOTE — Telephone Encounter (Signed)
Will refill electronically  

## 2011-06-01 NOTE — Telephone Encounter (Signed)
[facility] [employer] request refill Tramadol 50 mg. Pt has been seeing urologist for low back pain. Pt said she has small kidney stone and has f/u appt with urologist 06/09/11. Pt did not ask ( pt can't recall urologist name right now) urologist for Tramadol.Please advise. Pt said if she did not hear back from me she will ck with pharmacy later today for rx.

## 2011-06-01 NOTE — Telephone Encounter (Signed)
[name] request refill on Tramadol 50 mg

## 2011-06-22 ENCOUNTER — Emergency Department (HOSPITAL_COMMUNITY)
Admission: EM | Admit: 2011-06-22 | Discharge: 2011-06-23 | Disposition: A | Discharge status: Home / Self Care | Payer: 59 | Attending: Emergency Medicine | Admitting: Emergency Medicine

## 2011-06-22 ENCOUNTER — Encounter (HOSPITAL_COMMUNITY): Payer: Self-pay | Admitting: Emergency Medicine

## 2011-06-22 DIAGNOSIS — N200 Calculus of kidney: Secondary | ICD-10-CM | POA: Insufficient documentation

## 2011-06-22 DIAGNOSIS — R319 Hematuria, unspecified: Secondary | ICD-10-CM | POA: Insufficient documentation

## 2011-06-22 DIAGNOSIS — R109 Unspecified abdominal pain: Secondary | ICD-10-CM | POA: Insufficient documentation

## 2011-06-22 NOTE — ED Notes (Signed)
PT. REPORTS RIGHT FLANK PAIN WITH HEMATURIA AND CHILLS ONSET THIS MORNING .

## 2011-06-23 ENCOUNTER — Encounter (HOSPITAL_COMMUNITY): Payer: Self-pay | Admitting: Radiology

## 2011-06-23 ENCOUNTER — Emergency Department (HOSPITAL_COMMUNITY): Payer: 59

## 2011-06-23 LAB — BASIC METABOLIC PANEL
BUN: 15 mg/dL (ref 6–23)
CO2: 23 mEq/L (ref 19–32)
Calcium: 9.6 mg/dL (ref 8.4–10.5)
Chloride: 102 mEq/L (ref 96–112)
Creatinine, Ser: 0.69 mg/dL (ref 0.50–1.10)
Glucose, Bld: 97 mg/dL (ref 70–99)

## 2011-06-23 LAB — URINALYSIS, ROUTINE W REFLEX MICROSCOPIC
Glucose, UA: NEGATIVE mg/dL
Hgb urine dipstick: NEGATIVE
Specific Gravity, Urine: 1.022 (ref 1.005–1.030)
pH: 6 (ref 5.0–8.0)

## 2011-06-23 MED ORDER — SODIUM CHLORIDE 0.9 % IV BOLUS (SEPSIS)
1000.0000 mL | Freq: Once | INTRAVENOUS | Status: AC
  Administered 2011-06-23: 1000 mL via INTRAVENOUS

## 2011-06-23 MED ORDER — HYDROMORPHONE HCL PF 1 MG/ML IJ SOLN
1.0000 mg | Freq: Once | INTRAMUSCULAR | Status: AC
  Administered 2011-06-23: 1 mg via INTRAVENOUS
  Filled 2011-06-23: qty 1

## 2011-06-23 MED ORDER — NAPROXEN 500 MG PO TABS: 500.0000 mg | ORAL_TABLET | Freq: Two times a day (BID) | ORAL | Status: DC

## 2011-06-23 MED ORDER — TAMSULOSIN HCL 0.4 MG PO CAPS: 0.4000 mg | ORAL_CAPSULE | Freq: Every day | ORAL | Status: DC

## 2011-06-23 MED ORDER — KETOROLAC TROMETHAMINE 30 MG/ML IJ SOLN
30.0000 mg | Freq: Once | INTRAMUSCULAR | Status: AC
  Administered 2011-06-23: 30 mg via INTRAVENOUS
  Filled 2011-06-23: qty 1

## 2011-06-23 MED ORDER — OXYCODONE-ACETAMINOPHEN 5-325 MG PO TABS: 1.0000 | ORAL_TABLET | Freq: Four times a day (QID) | ORAL | Status: DC | PRN

## 2011-06-23 MED ORDER — ONDANSETRON HCL 4 MG/2ML IJ SOLN
4.0000 mg | Freq: Once | INTRAMUSCULAR | Status: AC
  Administered 2011-06-23: 4 mg via INTRAVENOUS
  Filled 2011-06-23: qty 2

## 2011-06-23 NOTE — ED Provider Notes (Signed)
History     CSN: [id number]  Arrival date & time 06/22/11  2307   First MD Initiated Contact with Patient 06/23/11 [phone]      Chief Complaint  Patient presents with  . Flank Pain    (Consider location/radiation/quality/duration/timing/severity/associated sxs/prior treatment) HPI Comments: History of kidney stones. Had a CAT scan several months ago which showed numerous renal calculi. Developed right flank pain yesterday with associated hematuria. No fever.  Followed by dr Isabel Caprice  Patient is a 46 y.o. female presenting with flank pain. The history is provided by the patient. No language interpreter was used.  Flank Pain This is a new problem. The current episode started yesterday. The problem occurs constantly. The problem has been gradually worsening. Pertinent negatives include no chest pain, no abdominal pain, no headaches and no shortness of breath. The symptoms are aggravated by nothing. The symptoms are relieved by position. She has tried nothing for the symptoms. The treatment provided no relief.    Past Medical History  Diagnosis Date  . Anemia     iron deficiency anemia  . Sickle cell trait   . Fibroid uterus     painful menses  . Menorrhagia   . Allergy     allergic rhinitis  . Personal history of kidney stones   . PPD positive     Hx of  . HPV in female     also some abnormal paps in past  . GERD (gastroesophageal reflux disease)   . Hiatal hernia   . Gross hematuria 2013    sent to urology    Past Surgical History  Procedure Date  . Breast surgery     removal of breast mass / benign /age 38-20  . Cesarean section     x3  . Tubal ligation     BTL  . Tonsillectomy and adenoidectomy     age 9  . Appendectomy     6  . Pelvic laparoscopy     x2 for cysts r/o endometriosis  . Laparoscopic vaginal hysterectomy 12/02/2010    menorrhagia, leiomyomata  . Cystoscopy 2008    L RPG, stone extraction ([place])    Family History  Problem Relation Age of Onset   . Cancer Maternal Aunt     breast CA  . Cancer Paternal Aunt     uterine CA  . Lupus Cousin   . Anesthesia problems Mother     History  Substance Use Topics  . Smoking status: Never Smoker   . Smokeless tobacco: Never Used  . Alcohol Use: No    OB History    Grav Para Term Preterm Abortions TAB SAB Ect Mult Living   3 3              Review of Systems  Constitutional: Negative for fever, activity change, appetite change and fatigue.  HENT: Negative for congestion, sore throat, rhinorrhea, neck pain and neck stiffness.   Respiratory: Negative for cough and shortness of breath.   Cardiovascular: Negative for chest pain and palpitations.  Gastrointestinal: Positive for nausea. Negative for vomiting and abdominal pain.  Genitourinary: Positive for hematuria and flank pain. Negative for dysuria, urgency, frequency and vaginal bleeding.  Musculoskeletal: Negative for myalgias, back pain and arthralgias.  Neurological: Negative for dizziness, weakness, light-headedness, numbness and headaches.  All other systems reviewed and are negative.    Allergies  Morphine  Home Medications   Current Outpatient Rx  Name Route Sig Dispense Refill  . BALANCED CARE PO Oral  Take 1 tablet by mouth daily.      Marland Kitchen NAPROXEN 500 MG PO TABS  Take one po bid x 1 week then prn pain, take with food 60 tablet 0  . OMEPRAZOLE 40 MG PO CPDR  TAKE ONE CAPSULE BY MOUTH ONE TIME DAILY 30 capsule 0  . TRAMADOL HCL 50 MG PO TABS  TAKE ONE TABLET BY MOUTH EVERY SIX HOURS AS NEEDED FOR PAIN. MAX DOSE IS 8 TABLETS PER DAY 30 tablet 0  . NAPROXEN 500 MG PO TABS Oral Take 1 tablet (500 mg total) by mouth 2 (two) times daily. 30 tablet 0  . OXYCODONE-ACETAMINOPHEN 5-325 MG PO TABS Oral Take 1-2 tablets by mouth every 6 (six) hours as needed for pain. 20 tablet 0  . TAMSULOSIN HCL 0.4 MG PO CAPS Oral Take 1 capsule (0.4 mg total) by mouth daily. 10 capsule 0    BP 116/70  Pulse 73  Temp(Src) 98.3 F (36.8 C)  (Oral)  Resp 19  SpO2 98%  LMP 11/24/2010  Physical Exam  Nursing note and vitals reviewed. Constitutional: She is oriented to person, place, and time. She appears well-developed and well-nourished.       Appears in pain  HENT:  Head: Normocephalic and atraumatic.  Mouth/Throat: Oropharynx is clear and moist.  Eyes: Conjunctivae and EOM are normal. Pupils are equal, round, and reactive to light.  Neck: Normal range of motion. Neck supple.  Cardiovascular: Normal rate, regular rhythm, normal heart sounds and intact distal pulses.  Exam reveals no gallop and no friction rub.   No murmur heard. Pulmonary/Chest: Effort normal and breath sounds normal. No respiratory distress. She exhibits no tenderness.  Abdominal: Soft. Bowel sounds are normal. There is no tenderness.       R CVA tenderness  Musculoskeletal: Normal range of motion. She exhibits no tenderness.  Neurological: She is alert and oriented to person, place, and time. No cranial nerve deficit.  Skin: Skin is warm and dry. No rash noted.    ED Course  Procedures (including critical care time)  Labs Reviewed  URINALYSIS, ROUTINE W REFLEX MICROSCOPIC - Abnormal; Notable for the following:    APPearance CLOUDY (*)    All other components within normal limits  BASIC METABOLIC PANEL   Ct Abdomen Pelvis Wo Contrast  06/23/2011  *RADIOLOGY REPORT*  Clinical Data: Right flank pain, hematuria and chills.  CT ABDOMEN AND PELVIS WITHOUT CONTRAST  Technique:  Multidetector CT imaging of the abdomen and pelvis was performed following the standard protocol without intravenous contrast.  Comparison: CT of the abdomen and pelvis performed 05/05/2011  Findings: The visualized lung bases are clear.  The liver and spleen are unremarkable in appearance.  The gallbladder is within normal limits.  The pancreas and adrenal glands are unremarkable.  Scattered nonobstructing stones are seen within both kidneys, measuring up to 5 mm in size.  There is  no evidence of hydronephrosis.  No obstructing ureteral stones are identified.  No perinephric stranding is seen.  No free fluid is identified.  The small bowel is unremarkable in appearance.  The stomach is within normal limits.  No acute vascular abnormalities are seen.  The patient is status post appendectomy.  The colon is unremarkable in appearance.  The bladder is decompressed and not well assessed.  The patient is post hysterectomy.  The ovaries are relatively symmetric; mild bilateral ovarian prominence is stable from 2008.  No suspicious adnexal masses are seen.  No inguinal lymphadenopathy is seen.  No acute osseous abnormalities are identified.  IMPRESSION:  1.  No acute abnormalities seen within the abdomen or pelvis. 2.  Scattered nonobstructing bilateral renal stones seen, measuring up to 5 mm in size.  Original Report Authenticated By: Tonia Ghent, M.D.     1. Kidney stones   2. Flank pain       MDM  Patient with resolution of her symptoms after milligram of Dilaudid and Toradol. She also received IV fluids. CT scan showed multiple kidney stones. She has good urologic followup. She'll followup in one week. She was provided prescriptions for Naprosyn, Percocet, Flomax. Provided strict return precautions. There is no evidence of urinary tract infection.        Dayton Bailiff, MD 06/23/11 [phone]

## 2011-06-23 NOTE — ED Notes (Signed)
Patient presents to ed c/o right flank pain with radiation to right lower quad. Patient states she has been having difficulty urinating since yest. States she was seen by her MD last week and dx. With 112 kidney stones.

## 2011-06-23 NOTE — Discharge Instructions (Signed)

## 2011-06-30 ENCOUNTER — Encounter: Payer: Self-pay | Admitting: Family Medicine

## 2011-06-30 ENCOUNTER — Ambulatory Visit (INDEPENDENT_AMBULATORY_CARE_PROVIDER_SITE_OTHER): Payer: 59 | Admitting: Family Medicine

## 2011-06-30 VITALS — BP 110/74 | HR 79 | Temp 98.5°F | Wt 150.0 lb

## 2011-06-30 DIAGNOSIS — Z87442 Personal history of urinary calculi: Secondary | ICD-10-CM

## 2011-06-30 MED ORDER — TRAMADOL HCL 50 MG PO TABS: 50.0000 mg | ORAL_TABLET | Freq: Four times a day (QID) | ORAL | Status: AC | PRN

## 2011-06-30 MED ORDER — OXYCODONE-ACETAMINOPHEN 5-325 MG PO TABS: 1.0000 | ORAL_TABLET | Freq: Four times a day (QID) | ORAL | Status: AC | PRN

## 2011-06-30 MED ORDER — TAMSULOSIN HCL 0.4 MG PO CAPS: 0.4000 mg | ORAL_CAPSULE | Freq: Every day | ORAL | Status: DC

## 2011-06-30 NOTE — Progress Notes (Signed)
Was seen at ER with renal stones.  H/o blood in urine before going to ER.  Prev seen by Dr. Isabel Caprice with uro.  Still with R lower back pain.  No burning with urination.  Last saw blood in urine last week.  No FCNAVD.  No VB, discharge.  Taking meds listed, out of percocet.  Was taking it at night to rest.  Was able to take tramadol prev with the pain. Couldn't give urine sample. 6-7/10 pain now.  Has f/u with Dr. Isabel Caprice in May.  No other back pain trigger known.  Pain with certain movements.    Meds, vitals, and allergies reviewed.   ROS: See HPI.  Otherwise, noncontributory.  nad ncat rrr ctab abd soft, not ttp, normal BS SI joint not sore on testing.  Back w/o midline or L sided tenderness, R lower back tender but no CVA pain.

## 2011-06-30 NOTE — Assessment & Plan Note (Signed)
Treat as a stone, doesn't appear to have UTI based on hx.  Unable to give urine sample. Will have patient use tramadol vs percocet and cont flomax, then f/u with uro.  If sx continue in meantime, then notify PCP.  She agrees. I don't see other obvious source.

## 2011-06-30 NOTE — Patient Instructions (Signed)
Continue with your current meds, take tramadol as needed for pain.  If continued, notify Dr. Isabel Caprice and/or Dr. Milinda Antis.

## 2011-07-07 ENCOUNTER — Encounter (HOSPITAL_COMMUNITY): Payer: Self-pay | Admitting: Emergency Medicine

## 2011-07-07 ENCOUNTER — Ambulatory Visit: Payer: 59 | Admitting: Internal Medicine

## 2011-07-07 ENCOUNTER — Telehealth: Payer: Self-pay | Admitting: Family Medicine

## 2011-07-07 ENCOUNTER — Emergency Department (INDEPENDENT_AMBULATORY_CARE_PROVIDER_SITE_OTHER)
Admission: EM | Admit: 2011-07-07 | Discharge: 2011-07-07 | Disposition: A | Discharge status: Home / Self Care | Payer: 59 | Source: Home / Self Care | Attending: Family Medicine | Admitting: Family Medicine

## 2011-07-07 DIAGNOSIS — N200 Calculus of kidney: Secondary | ICD-10-CM

## 2011-07-07 LAB — POCT URINALYSIS DIP (DEVICE)
Nitrite: NEGATIVE
Protein, ur: NEGATIVE mg/dL
Specific Gravity, Urine: 1.01 (ref 1.005–1.030)
Urobilinogen, UA: 0.2 mg/dL (ref 0.0–1.0)
pH: 5.5 (ref 5.0–8.0)

## 2011-07-07 MED ORDER — ONDANSETRON HCL 4 MG PO TABS: 4.0000 mg | ORAL_TABLET | Freq: Four times a day (QID) | ORAL | Status: AC

## 2011-07-07 MED ORDER — ONDANSETRON HCL 4 MG/2ML IJ SOLN
4.0000 mg | Freq: Once | INTRAMUSCULAR | Status: AC
  Administered 2011-07-07: 4 mg via INTRAMUSCULAR

## 2011-07-07 MED ORDER — KETOROLAC TROMETHAMINE 30 MG/ML IJ SOLN
INTRAMUSCULAR | Status: AC
  Filled 2011-07-07: qty 1

## 2011-07-07 MED ORDER — KETOROLAC TROMETHAMINE 30 MG/ML IJ SOLN
30.0000 mg | Freq: Once | INTRAMUSCULAR | Status: AC
  Administered 2011-07-07: 30 mg via INTRAMUSCULAR

## 2011-07-07 MED ORDER — ONDANSETRON HCL 4 MG/2ML IJ SOLN
INTRAMUSCULAR | Status: AC
  Filled 2011-07-07: qty 2

## 2011-07-07 MED ORDER — TAMSULOSIN HCL 0.4 MG PO CAPS: 0.4000 mg | ORAL_CAPSULE | Freq: Every day | ORAL | Status: DC

## 2011-07-07 NOTE — ED Notes (Signed)
Kidney stone pain per patient.  Dr Artis Flock at bedside

## 2011-07-07 NOTE — Discharge Instructions (Signed)
Drink plenty of fluids, call dr Isabel Caprice on fri if problem continues., use medicine as prescribed

## 2011-07-07 NOTE — ED Provider Notes (Signed)
History     CSN: [id number]  Arrival date & time 07/07/11  1653   First MD Initiated Contact with Patient 07/07/11 1656      Chief Complaint  Patient presents with  . Nephrolithiasis    (Consider location/radiation/quality/duration/timing/severity/associated sxs/prior treatment) Patient is a 46 y.o. female presenting with flank pain. The history is provided by the patient and the spouse.  Flank Pain This is a chronic problem. The current episode started 6 to 12 hours ago (seen in ER 3/20 with flank pain, ct done--mult  stones, sees dr Isabel Caprice --urology for f/u. ). The problem occurs constantly. The problem has been gradually worsening. Pertinent negatives include no abdominal pain. The symptoms are aggravated by nothing.    Past Medical History  Diagnosis Date  . Anemia     iron deficiency anemia  . Sickle cell trait   . Fibroid uterus     painful menses  . Menorrhagia   . Allergy     allergic rhinitis  . Personal history of kidney stones   . PPD positive     Hx of  . HPV in female     also some abnormal paps in past  . GERD (gastroesophageal reflux disease)   . Hiatal hernia   . Gross hematuria 2013    sent to urology    Past Surgical History  Procedure Date  . Breast surgery     removal of breast mass / benign /age 31-20  . Cesarean section     x3  . Tubal ligation     BTL  . Tonsillectomy and adenoidectomy     age 78  . Appendectomy     6  . Pelvic laparoscopy     x2 for cysts r/o endometriosis  . Laparoscopic vaginal hysterectomy 12/02/2010    menorrhagia, leiomyomata  . Cystoscopy 2008    L RPG, stone extraction ([place])    Family History  Problem Relation Age of Onset  . Cancer Maternal Aunt     breast CA  . Cancer Paternal Aunt     uterine CA  . Lupus Cousin   . Anesthesia problems Mother     History  Substance Use Topics  . Smoking status: Never Smoker   . Smokeless tobacco: Never Used  . Alcohol Use: No    OB History    Grav Para  Term Preterm Abortions TAB SAB Ect Mult Living   3 3              Review of Systems  Constitutional: Negative.   Gastrointestinal: Positive for nausea. Negative for vomiting, abdominal pain and diarrhea.  Genitourinary: Positive for flank pain.    Allergies  Morphine  Home Medications   Current Outpatient Rx  Name Route Sig Dispense Refill  . BALANCED CARE PO Oral Take 1 tablet by mouth daily.      Marland Kitchen NAPROXEN 500 MG PO TABS  Take one po bid x 1 week then prn pain, take with food 60 tablet 0  . OMEPRAZOLE 40 MG PO CPDR  TAKE ONE CAPSULE BY MOUTH ONE TIME DAILY 30 capsule 0  . OXYCODONE-ACETAMINOPHEN 5-325 MG PO TABS Oral Take 1-2 tablets by mouth every 6 (six) hours as needed for pain (sedation caution). 20 tablet 0  . TAMSULOSIN HCL 0.4 MG PO CAPS Oral Take 1 capsule (0.4 mg total) by mouth daily. 30 capsule 0  . TAMSULOSIN HCL 0.4 MG PO CAPS Oral Take 1 capsule (0.4 mg total) by mouth daily.  30 capsule 1  . TRAMADOL HCL 50 MG PO TABS Oral Take 1 tablet (50 mg total) by mouth every 6 (six) hours as needed for pain. 40 tablet 0    BP 115/66  Pulse 88  Temp(Src) 98.1 F (36.7 C) (Oral)  Resp 20  SpO2 99%  LMP 11/24/2010  Physical Exam  Nursing note and vitals reviewed. Constitutional: She appears well-developed and well-nourished. She appears distressed.  Abdominal: Soft. Bowel sounds are normal. She exhibits no distension and no mass. There is no tenderness. There is CVA tenderness. There is no rebound and no guarding.    ED Course  Procedures (including critical care time)  Labs Reviewed  POCT URINALYSIS DIP (DEVICE) - Abnormal; Notable for the following:    Hgb urine dipstick MODERATE (*)    All other components within normal limits   No results found.   1. Kidney stone on left side       MDM  U/a abnl.        Linna Hoff, MD 07/07/11 [phone]

## 2011-07-07 NOTE — Telephone Encounter (Signed)
Caller: [name]/Patient; PCP: Ruthe Mannan ([facility]); CB#: [phone]; ; ; Call regarding Having Urgency To Urinate and Severe Back Pain.;  Has urgency with urination since 4-4. Has pain in flank and lower back since 4-4. Afebrile. Has taken Hydrocodone and "subsided a little" but has not competely gone. Feels better when moves. Appointment scheduled for 4-4 at 1615 with Dr. Alphonsus Sias.

## 2011-07-07 NOTE — Telephone Encounter (Signed)
Aware -has appt 

## 2011-08-18 ENCOUNTER — Telehealth: Payer: Self-pay | Admitting: Family Medicine

## 2011-08-18 NOTE — Telephone Encounter (Signed)
Have her f/u if symptoms do not improve

## 2011-08-18 NOTE — Telephone Encounter (Signed)
Caller: [name]/Patient; PCP: Roxy Manns A.; CB#: [phone]; Call regarding Blood Vessel Broken in Eye and Felt slightly Lightheaded This Morning; Slight headache onset 08/17/11 - after being outside and has pressure behind eyes. No other sx. Triage and Care advice per [facility] Problems Protocol and advised to call back if needed per Protocol Guidelines.

## 2011-08-18 NOTE — Telephone Encounter (Signed)
Patient advised as instructed via telephone. 

## 2011-09-14 ENCOUNTER — Other Ambulatory Visit: Payer: Self-pay | Admitting: Family Medicine

## 2011-09-14 NOTE — Telephone Encounter (Signed)
Spoke to patient and was advised that she is taking the medication for kidney stone pain. Patient states that she plans on calling Dr. Ellin Goodie office in the morning to get an appointment scheduled with him because he had told her that he may have to do a procedure to go in and break the kidney stones up. Advised patient to check with her pharmacy later on tonight for the refill.

## 2011-09-14 NOTE — Telephone Encounter (Signed)
Is she still needing this for kidney stone pain? If so let me know how she is doing and who she is seeing for it-thanks

## 2011-09-15 NOTE — Telephone Encounter (Signed)
Will refil electronically  

## 2011-10-03 HISTORY — PX: FOOT SURGERY: SHX648

## 2012-01-05 ENCOUNTER — Ambulatory Visit (INDEPENDENT_AMBULATORY_CARE_PROVIDER_SITE_OTHER): Payer: 59 | Admitting: Women's Health

## 2012-01-05 ENCOUNTER — Encounter: Payer: Self-pay | Admitting: Women's Health

## 2012-01-05 DIAGNOSIS — N949 Unspecified condition associated with female genital organs and menstrual cycle: Secondary | ICD-10-CM

## 2012-01-05 DIAGNOSIS — B373 Candidiasis of vulva and vagina: Secondary | ICD-10-CM

## 2012-01-05 DIAGNOSIS — N920 Excessive and frequent menstruation with regular cycle: Secondary | ICD-10-CM

## 2012-01-05 DIAGNOSIS — D259 Leiomyoma of uterus, unspecified: Secondary | ICD-10-CM

## 2012-01-05 DIAGNOSIS — N898 Other specified noninflammatory disorders of vagina: Secondary | ICD-10-CM

## 2012-01-05 MED ORDER — METRONIDAZOLE 0.75 % VA GEL: VAGINAL | Status: DC

## 2012-01-05 MED ORDER — FLUCONAZOLE 150 MG PO TABS: 150.0000 mg | ORAL_TABLET | Freq: Once | ORAL | Status: DC

## 2012-01-05 NOTE — Progress Notes (Signed)
Patient ID: [name], female   DOB: [birth year], 46 y.o.   MRN: [id number] Presents with complaint of vaginal discharge with odor and mild itching for 5 days. Denies UTI symptoms, fever or abdominal pain. History of LAVH for fibroids/menorrhagia August 2012.  Exam: External genitalia within normal limits, speculum exam scant milky discharge present wet prep positive for few yeast, positive amines, moderate clue cells, TNTC bacteria. Bimanual no adnexal fullness or tenderness.  BV and yeast  Plan: MetroGel vaginal cream 1 applicator at bedtime x5, Diflucan 150 by mouth times one dose with refill prescriptions for both given reviewed and explained. Instructed to call if no relief of symptoms.

## 2012-01-05 NOTE — Assessment & Plan Note (Signed)
[facility] 11/2010 Dr. Audie Box

## 2012-01-05 NOTE — Patient Instructions (Addendum)
Bacterial Vaginosis Bacterial vaginosis (BV) is a vaginal infection where the normal balance of bacteria in the vagina is disrupted. The normal balance is then replaced by an overgrowth of certain bacteria. There are several different kinds of bacteria that can cause BV. BV is the most common vaginal infection in women of childbearing age. CAUSES   The cause of BV is not fully understood. BV develops when there is an increase or imbalance of harmful bacteria.  Some activities or behaviors can upset the normal balance of bacteria in the vagina and put women at increased risk including:  Having a new sex partner or multiple sex partners.  Douching.  Using an intrauterine device (IUD) for contraception.  It is not clear what role sexual activity plays in the development of BV. However, women that have never had sexual intercourse are rarely infected with BV. Women do not get BV from toilet seats, bedding, swimming pooMonilial Vaginitis Vaginitis in a soreness, swelling and redness (inflammation) of the vagina and vulva. Monilial vaginitis is not a sexually transmitted infection. CAUSES  Yeast vaginitis is caused by yeast (candida) that is normally found in your vagina. With a yeast infection, the candida has overgrown in number to a point that upsets the chemical balance. SYMPTOMS   White, thick vaginal discharge.  Swelling, itching, redness and irritation of the vagina and possibly the lips of the vagina (vulva).  Burning or painful urination.  Painful intercourse. DIAGNOSIS  Things that may contribute to monilial vaginitis are:  Postmenopausal and virginal states.  Pregnancy.  Infections.  Being tired, sick or stressed, especially if you had monilial vaginitis in the past.  Diabetes. Good control will help lower the chance.  Birth control pills.  Tight fitting garments.  Using bubble bath, feminine sprays, douches or deodorant tampons.  Taking certain medications that  kill germs (antibiotics).  Sporadic recurrence can occur if you become ill. TREATMENT  Your caregiver will give you medication.  There are several kinds of anti monilial vaginal creams and suppositories specific for monilial vaginitis. For recurrent yeast infections, use a suppository or cream in the vagina 2 times a week, or as directed.  Anti-monilial or steroid cream for the itching or irritation of the vulva may also be used. Get your caregiver's permission.  Painting the vagina with methylene blue solution may help if the monilial cream does not work.  Eating yogurt may help prevent monilial vaginitis. HOME CARE INSTRUCTIONS   Finish all medication as prescribed.  Do not have sex until treatment is completed or after your caregiver tells you it is okay.  Take warm sitz baths.  Do not douche.  Do not use tampons, especially scented ones.  Wear cotton underwear.  Avoid tight pants and panty hose.  Tell your sexual partner that you have a yeast infection. They should go to their caregiver if they have symptoms such as mild rash or itching.  Your sexual partner should be treated as well if your infection is difficult to eliminate.  Practice safer sex. Use condoms.  Some vaginal medications cause latex condoms to fail. Vaginal medications that harm condoms are:  Cleocin cream.  Butoconazole (Femstat).  Terconazole (Terazol) vaginal suppository.  Miconazole (Monistat) (may be purchased over the counter). SEEK MEDICAL CARE IF:   You have a temperature by mouth above 102 F (38.9 C).  The infection is getting worse after 2 days of treatment.  The infection is not getting better after 3 days of treatment.  You develop blisters in  or around your vagina.  You develop vaginal bleeding, and it is not your menstrual period.  You have pain when you urinate.  You develop intestinal problems.  You have pain with sexual intercourse. Document Released: 12/29/2004  Document Revised: 06/13/2011 Document Reviewed: 09/12/2008 [facility] Patient Information 2013 [place], [place]. ls or from touching objects around them.  SYMPTOMS   Grey vaginal discharge.  A fish-like odor with discharge, especially after sexual intercourse.  Itching or burning of the vagina and vulva.  Burning or pain with urination.  Some women have no signs or symptoms at all. DIAGNOSIS  Your caregiver must examine the vagina for signs of BV. Your caregiver will perform lab tests and look at the sample of vaginal fluid through a microscope. They will look for bacteria and abnormal cells (clue cells), a pH test higher than 4.5, and a positive amine test all associated with BV.  RISKS AND COMPLICATIONS   Pelvic inflammatory disease (PID).  Infections following gynecology surgery.  Developing HIV.  Developing herpes virus. TREATMENT  Sometimes BV will clear up without treatment. However, all women with symptoms of BV should be treated to avoid complications, especially if gynecology surgery is planned. Female partners generally do not need to be treated. However, BV may spread between female sex partners so treatment is helpful in preventing a recurrence of BV.   BV may be treated with antibiotics. The antibiotics come in either pill or vaginal cream forms. Either can be used with nonpregnant or pregnant women, but the recommended dosages differ. These antibiotics are not harmful to the baby.  BV can recur after treatment. If this happens, a second round of antibiotics will often be prescribed.  Treatment is important for pregnant women. If not treated, BV can cause a premature delivery, especially for a pregnant woman who had a premature birth in the past. All pregnant women who have symptoms of BV should be checked and treated.  For chronic reoccurrence of BV, treatment with a type of prescribed gel vaginally twice a week is helpful. HOME CARE INSTRUCTIONS   Finish all medication  as directed by your caregiver.  Do not have sex until treatment is completed.  Tell your sexual partner that you have a vaginal infection. They should see their caregiver and be treated if they have problems, such as a mild rash or itching.  Practice safe sex. Use condoms. Only have 1 sex partner. PREVENTION  Basic prevention steps can help reduce the risk of upsetting the natural balance of bacteria in the vagina and developing BV:  Do not have sexual intercourse (be abstinent).  Do not douche.  Use all of the medicine prescribed for treatment of BV, even if the signs and symptoms go away.  Tell your sex partner if you have BV. That way, they can be treated, if needed, to prevent reoccurrence. SEEK MEDICAL CARE IF:   Your symptoms are not improving after 3 days of treatment.  You have increased discharge, pain, or fever. MAKE SURE YOU:   Understand these instructions.  Will watch your condition.  Will get help right away if you are not doing well or get worse. FOR MORE INFORMATION  Division of STD Prevention (DSTDP), Centers for Disease Control and Prevention: SolutionApps.[employer].za American Social Health Association (ASHA): www.ashastd.org  Document Released: 03/21/2005 Document Revised: 06/13/2011 Document Reviewed: 09/11/2008 [facility] Patient Information 2013 [place], [place].

## 2012-01-25 ENCOUNTER — Encounter: Payer: 59 | Admitting: Gynecology

## 2012-02-14 ENCOUNTER — Encounter: Payer: Self-pay | Admitting: Gynecology

## 2012-02-14 ENCOUNTER — Ambulatory Visit (INDEPENDENT_AMBULATORY_CARE_PROVIDER_SITE_OTHER): Payer: 59 | Admitting: Gynecology

## 2012-02-14 VITALS — BP 120/70 | Ht 63.0 in | Wt 148.0 lb

## 2012-02-14 DIAGNOSIS — Z01419 Encounter for gynecological examination (general) (routine) without abnormal findings: Secondary | ICD-10-CM

## 2012-02-14 DIAGNOSIS — R1031 Right lower quadrant pain: Secondary | ICD-10-CM

## 2012-02-14 NOTE — Patient Instructions (Signed)
Follow up for ultrasound as scheduled 

## 2012-02-14 NOTE — Progress Notes (Signed)
[name] Mar 29, 1966 [id number]        46 y.o.  [id number] for annual exam.    Past medical history,surgical history, medications, allergies, family history and social history were all reviewed and documented in the EPIC chart. ROS:  Was performed and pertinent positives and negatives are included in the history.  Exam: Biomedical scientist Filed Vitals:   02/14/12 1544  BP: 120/70  Height: 5\' 3"  (1.6 m)  Weight: 148 lb (67.132 kg)   General appearance  Normal Skin grossly normal Head/Neck normal with no cervical or supraclavicular adenopathy thyroid normal Lungs  clear Cardiac RR, without RMG Abdominal  soft, nontender, without masses, organomegaly or hernia Breasts  examined lying and sitting without masses, retractions, discharge or axillary adenopathy. Pelvic  Ext/BUS/vagina  normal    Adnexa  Without masses or tenderness    Anus and perineum  normal   Rectovaginal  normal sphincter tone without palpated masses or tenderness.    Assessment/Plan:  46 y.o. [id number] female for annual exam.   1. Right lower quadrant pain.  On and off for the past several months. No diarrhea constipation. Has a little bit of frequency no dysuria urgency nausea vomiting. Exam is normal. Check urinalysis and ultrasound rule out ovarian process.  2. Status post LAVH. Doing well otherwise without hormonal symptoms. We'll continue to monitor. 3. Pap smear. No Pap smear done today. Last Pap smear July 2012. No history of abnormal Pap smears previously. Options of stopped breathing altogether [place] is status post hysterectomy for benign indication versus less frequent screening. We'll readdress on an annual basis. 4. Mammography. Patient to now knows to schedule. SBE monthly reviewed. 5. Health maintenance. No blood work done today a system to her primary physician's office. Follow up for ultrasound otherwise annually.    Dara Lords MD, 4:43 PM 02/14/2012

## 2012-02-16 ENCOUNTER — Other Ambulatory Visit: Payer: Self-pay | Admitting: Family Medicine

## 2012-02-16 DIAGNOSIS — Z1231 Encounter for screening mammogram for malignant neoplasm of breast: Secondary | ICD-10-CM

## 2012-02-27 ENCOUNTER — Ambulatory Visit (INDEPENDENT_AMBULATORY_CARE_PROVIDER_SITE_OTHER): Payer: 59 | Admitting: Gynecology

## 2012-02-27 ENCOUNTER — Encounter: Payer: Self-pay | Admitting: Gynecology

## 2012-02-27 ENCOUNTER — Ambulatory Visit (INDEPENDENT_AMBULATORY_CARE_PROVIDER_SITE_OTHER): Payer: 59

## 2012-02-27 DIAGNOSIS — N949 Unspecified condition associated with female genital organs and menstrual cycle: Secondary | ICD-10-CM

## 2012-02-27 DIAGNOSIS — R102 Pelvic and perineal pain: Secondary | ICD-10-CM

## 2012-02-27 DIAGNOSIS — N831 Corpus luteum cyst of ovary, unspecified side: Secondary | ICD-10-CM

## 2012-02-27 DIAGNOSIS — R1031 Right lower quadrant pain: Secondary | ICD-10-CM

## 2012-02-27 DIAGNOSIS — N8300 Follicular cyst of ovary, unspecified side: Secondary | ICD-10-CM

## 2012-02-27 DIAGNOSIS — N83209 Unspecified ovarian cyst, unspecified side: Secondary | ICD-10-CM

## 2012-02-27 NOTE — Progress Notes (Signed)
Patient presents for ultrasound due to intermittent right lower quadrant pain over the last several months. Low level without overt bowel or bladder associated symptoms.  Ultrasound shows right ovarian hemorrhagic appearing cyst 29 mm reticular type pattern. Peripheral flow. Left ovary normal. Cul-de-sac fluid noted.  Assessment and plan: Intermittent right-sided pain several months duration. Small right ovarian hemorrhagic appearing cyst. Possible endometrioma. Discussed possibilities with the patient to include physiologic versus pathologic up to and including ovarian cancer. Symptoms of discomfort minimal. Plan 2 month repeat ultrasound. If persists or enlarges consider laparoscopic removal. If resolves and pain is gone then follow. If resolves and pain persists consider GI evaluation.

## 2012-02-27 NOTE — Patient Instructions (Signed)
Follow up for ultrasound as scheduled 

## 2012-03-15 ENCOUNTER — Ambulatory Visit
Admission: RE | Admit: 2012-03-15 | Discharge: 2012-03-15 | Disposition: A | Discharge status: Home / Self Care | Payer: 59 | Source: Ambulatory Visit | Attending: Family Medicine | Admitting: Family Medicine

## 2012-03-15 DIAGNOSIS — Z1231 Encounter for screening mammogram for malignant neoplasm of breast: Secondary | ICD-10-CM

## 2012-03-19 ENCOUNTER — Encounter: Payer: Self-pay | Admitting: *Deleted

## 2012-04-04 DIAGNOSIS — I729 Aneurysm of unspecified site: Secondary | ICD-10-CM

## 2012-04-04 HISTORY — PX: BREAST EXCISIONAL BIOPSY: SUR124

## 2012-04-04 HISTORY — DX: Aneurysm of unspecified site: I72.9

## 2012-04-19 ENCOUNTER — Other Ambulatory Visit: Payer: Self-pay

## 2012-04-19 MED ORDER — OMEPRAZOLE 40 MG PO CPDR: 40.0000 mg | DELAYED_RELEASE_CAPSULE | Freq: Every day | ORAL | Status: DC

## 2012-04-19 NOTE — Telephone Encounter (Signed)
Pt has cpx scheduled 08/15/12.pt request refill omeprazole to [facility].Please advise.

## 2012-04-19 NOTE — Telephone Encounter (Signed)
I sent it electronically  

## 2012-04-20 ENCOUNTER — Ambulatory Visit (INDEPENDENT_AMBULATORY_CARE_PROVIDER_SITE_OTHER): Payer: 59 | Admitting: Family Medicine

## 2012-04-20 ENCOUNTER — Encounter: Payer: Self-pay | Admitting: Family Medicine

## 2012-04-20 VITALS — BP 124/76 | HR 84 | Temp 98.1°F | Wt 144.5 lb

## 2012-04-20 DIAGNOSIS — R1013 Epigastric pain: Secondary | ICD-10-CM

## 2012-04-20 DIAGNOSIS — R109 Unspecified abdominal pain: Secondary | ICD-10-CM

## 2012-04-20 LAB — CBC WITH DIFFERENTIAL/PLATELET
Basophils Relative: 1 % (ref 0–1)
Eosinophils Absolute: 0.2 10*3/uL (ref 0.0–0.7)
Hemoglobin: 13.5 g/dL (ref 12.0–15.0)
Lymphs Abs: 3.3 10*3/uL (ref 0.7–4.0)
MCH: 26.4 pg (ref 26.0–34.0)
MCHC: 33.8 g/dL (ref 30.0–36.0)
Monocytes Relative: 7 % (ref 3–12)
Neutro Abs: 6.5 10*3/uL (ref 1.7–7.7)
Neutrophils Relative %: 60 % (ref 43–77)
Platelets: 327 10*3/uL (ref 150–400)
RBC: 5.11 MIL/uL (ref 3.87–5.11)

## 2012-04-20 LAB — COMPREHENSIVE METABOLIC PANEL
Albumin: 4.3 g/dL (ref 3.5–5.2)
Alkaline Phosphatase: 59 U/L (ref 39–117)
BUN: 11 mg/dL (ref 6–23)
Calcium: 9.4 mg/dL (ref 8.4–10.5)
Chloride: 105 mEq/L (ref 96–112)
Glucose, Bld: 98 mg/dL (ref 70–99)
Potassium: 3.9 mEq/L (ref 3.5–5.3)
Sodium: 138 mEq/L (ref 135–145)
Total Protein: 7.4 g/dL (ref 6.0–8.3)

## 2012-04-20 LAB — LIPASE: Lipase: 28 U/L (ref 0–75)

## 2012-04-20 MED ORDER — TRAMADOL HCL 50 MG PO TABS: 50.0000 mg | ORAL_TABLET | Freq: Two times a day (BID) | ORAL | Status: DC

## 2012-04-20 NOTE — Assessment & Plan Note (Addendum)
Boring to back, along with epigastric and RUQ tenderness on exam. Some concern for pancreatitis - check blood work today. Alternatively dyspepsia with deteriorated GERD, PUD. In interim recommended continue omeprazole 40mg  daily, start tramadol for pain, and bowel rest - clear liquid diet over weekend. To call us with update next week.  If persistent sxs, low threshold to refer back to GI for EGD.   If worsening over weekend, to seek urgent care.  Pt agrees with plan. Does have history of esophagitis and duodenitis, last EGD done 2001 by Dr. Russella Dar.

## 2012-04-20 NOTE — Patient Instructions (Signed)
I'm worried about pancreas - blood work today.  If very abnormal we will call you with plan Clear liquid diet over weekend. Tramadol for pain, continue omeprazole. If worsening - seek urgent care.

## 2012-04-20 NOTE — Progress Notes (Signed)
  Subjective:    Patient ID: [name], female    DOB: [birth year], 47 y.o.   MRN: [id number]  HPI CC: GERD sxs?  Pleasant 47 yo with h/o GERD, HH and kidney stones presents with 2 wk h/o abdominal pain progressively worsening.  For the past week, feeling nauseated, vomiting, pain in epigastric region.  Painful to eat.  Pain starts in epigastric abdomen and radiates boring to back.  Also with back pain present for last 2 weeks.  Feeling heartburn sxs - food coming up.  Self started on omeprazole 2 days ago.  Some improvement noted with omeprazole and changing diet to clear liquids over last 2 days.  Denies fevers/chills.  No constipation.  No weight changes.  Vomited recently about 3 times, NBNB.  + early satiety endorsed.  Only tolerating jelly toast and liquids.  Thinks staying well hydrated.  Voiding several times a day.  Stooling more frequent as well, loose.  Has had EGD done in past by Dr. Russella Dar: IMPRESSION:  1. Proximal exudative esophagitis consistent with Candida.  2. Grade 1 distal esophagitis consistent with reflux.  3. Mild erosive duodenitis. Done 2001  Has had hysterectomy, C sections and appendectomy. No h/o gallstones.  No significant use of NSIADs. No EtOH No smoking, no rec drugs.  Past Medical History  Diagnosis Date  . Anemia     iron deficiency anemia  . Sickle cell trait   . Allergy     allergic rhinitis  . Personal history of kidney stones   . PPD positive     Hx of  . HPV in female     also some abnormal paps in past  . GERD (gastroesophageal reflux disease)   . Hiatal hernia   . Gross hematuria 2013    sent to urology    Past Surgical History  Procedure Date  . Breast surgery     removal of breast mass / benign /age [age]  . Cesarean section     x3  . Tubal ligation     BTL  . Tonsillectomy and adenoidectomy     age 78  . Appendectomy     6  . Pelvic laparoscopy     x2 for cysts r/o endometriosis  . Laparoscopic vaginal hysterectomy  12/02/2010    menorrhagia, leiomyomata  . Cystoscopy 2008    L RPG, stone extraction ([place])  . Foot surgery JULY 2013    RIGHT    Review of Systems Per HPI    Objective:   Physical Exam  Nursing note and vitals reviewed. Constitutional: She appears well-developed and well-nourished. No distress.  HENT:  Mouth/Throat: Oropharynx is clear and moist. No oropharyngeal exudate.  Cardiovascular: Normal rate, regular rhythm, normal heart sounds and intact distal pulses.   No murmur heard. Pulmonary/Chest: Effort normal and breath sounds normal. No respiratory distress. She has no wheezes. She has no rales.  Abdominal: Soft. Normal appearance and bowel sounds are normal. She exhibits no distension and no mass. There is no hepatosplenomegaly. There is tenderness (moderate) in the right upper quadrant, epigastric area and periumbilical area. There is no rigidity, no rebound, no guarding, no CVA tenderness and negative Murphy's sign.  Musculoskeletal: She exhibits no edema.  Psychiatric: She has a normal mood and affect.       Assessment & Plan:

## 2012-04-23 ENCOUNTER — Telehealth: Payer: Self-pay | Admitting: Family Medicine

## 2012-04-23 NOTE — Telephone Encounter (Signed)
[facility] Triage Call Report Triage Record Num: [id number] Operator: Estevan Oaks Patient Name: [name] Call Date & Time: 04/20/2012 7:31:10PM Patient Phone: [phone] PCP: Audrie Gallus. [facility] Patient Gender: Female PCP Fax : Patient DOB: [birth year] Practice Name: [facility] Reason for Call: Caller: Zella Ball; PCP: Eustaquio Boyden [facility]); CB#: [phone]; Zella Ball is calling from [facility] with stat lab results from CBC: WBC 10.5. All other values w/i normal limits. Ordered by Dr Patrice Paradise. Rn reviewed chart in Epic. Pt seen today due to GI sx's, vomiting, abd pain that bores through to back, loose stools. Was afebrile. CMP, CBC and Lipase ordered and all but WBC were normal. Dr Caryl Never notified. He suggests Rn check on pt and advise she should see MD again for fever or worsening sx's. Pt can be seen in office tomorrow too. Rn speaks with Ms Wolfert. She is pleasant, without distress. She voices understanding of above instructions. Instructed to call back if sx's worsen. Protocol(s) Used: Office Note Recommended Outcome per Protocol: Information Noted and Sent to Office Reason for Outcome: Caller information to office Care Advice: ~ 04/20/2012 8:09:56PM Page 1 of 1 [email or web address]

## 2012-05-02 ENCOUNTER — Ambulatory Visit: Payer: 59 | Admitting: Gynecology

## 2012-05-02 ENCOUNTER — Other Ambulatory Visit: Payer: 59

## 2012-05-04 ENCOUNTER — Encounter: Payer: Self-pay | Admitting: Gynecology

## 2012-05-04 ENCOUNTER — Ambulatory Visit: Payer: 59 | Admitting: Gynecology

## 2012-05-04 ENCOUNTER — Other Ambulatory Visit: Payer: Self-pay | Admitting: Gynecology

## 2012-05-04 ENCOUNTER — Ambulatory Visit (INDEPENDENT_AMBULATORY_CARE_PROVIDER_SITE_OTHER): Payer: 59 | Admitting: Gynecology

## 2012-05-04 ENCOUNTER — Ambulatory Visit: Payer: 59

## 2012-05-04 ENCOUNTER — Other Ambulatory Visit: Payer: Self-pay | Admitting: *Deleted

## 2012-05-04 ENCOUNTER — Telehealth: Payer: Self-pay

## 2012-05-04 DIAGNOSIS — IMO0002 Reserved for concepts with insufficient information to code with codable children: Secondary | ICD-10-CM

## 2012-05-04 DIAGNOSIS — D259 Leiomyoma of uterus, unspecified: Secondary | ICD-10-CM

## 2012-05-04 DIAGNOSIS — R1031 Right lower quadrant pain: Secondary | ICD-10-CM

## 2012-05-04 DIAGNOSIS — N83209 Unspecified ovarian cyst, unspecified side: Secondary | ICD-10-CM

## 2012-05-04 DIAGNOSIS — N920 Excessive and frequent menstruation with regular cycle: Secondary | ICD-10-CM

## 2012-05-04 NOTE — Progress Notes (Signed)
error 

## 2012-05-04 NOTE — Telephone Encounter (Signed)
I spoke with patient.  Surgery scheduled for Thursday, Feb 20 7:30am. Pre-op consult scheduled with Dr. Velvet Bathe.

## 2012-05-04 NOTE — Patient Instructions (Signed)
Office will call you to arrange surgery. 

## 2012-05-04 NOTE — Progress Notes (Signed)
Patient presents for follow up ultrasound due to a prior right ovarian cyst. She also is having consistent deep dyspareunia with a every coital episode with right pain stopping intercourse.  This is been going on for over a year. She is status post LAVH for menorrhagia leiomyoma 2012, 2 prior laparoscopies for ovarian cysts and pain, laparoscopic tubal sterilization, cesarean section x3.  Also being followed for renal lithiasis with active stones causing hematuria intermittently.  Ultrasound shows small cuff a vascular cyst at 15 mm. Right ovary is normal. Left ovary has echo-free corpus luteal cyst 27 x 20 mm. No cul-de-sac fluid.  Assessment and plan: History of recurrent ovarian cysts. Ultrasound today shows resolution of her prior right cyst. She's having chronic right pelvic pain to include deep dyspareunia interrupting intercourse.  I reviewed options with her to include observation, laparoscopy, laparoscopy with right salpingo-oophorectomy. The issues of removing a normal-appearing ovary and no guaranteeing relief from her pain discussed. She's had multiple pelvic surgeries in the potential for scarring causing her pain that may not be relieved with surgery realistically reviewed. The patient wants to proceed with laparoscopic RSO in 1 moved towards scheduling this and she'll represent for a full preoperative appointment.

## 2012-05-09 ENCOUNTER — Telehealth: Payer: Self-pay | Admitting: Family Medicine

## 2012-05-09 ENCOUNTER — Other Ambulatory Visit: Payer: Self-pay | Admitting: Family Medicine

## 2012-05-09 DIAGNOSIS — R1013 Epigastric pain: Secondary | ICD-10-CM

## 2012-05-09 DIAGNOSIS — K219 Gastro-esophageal reflux disease without esophagitis: Secondary | ICD-10-CM

## 2012-05-09 NOTE — Telephone Encounter (Signed)
Patient Information:  Caller Name: [name]  Phone: [phone]  Patient: [name]  Gender: Female  DOB: [birth year]  Age: 47 Years  PCP: [facility], [employer] [facility])  Pregnant: No  Office Follow Up:  Does the office need to follow up with this patient?: No  Instructions For The Office: N/A  RN Note:  Per Omeprazole website recommended once a day before a meal.  If not improved call back.  Symptoms  Reason For Call & Symptoms: Patient states she was diagnosed with acid reflux and given Rx Omeprazole at bedtime.  Patient wanting to know if she can take the medication in morning.  Reviewed Health History In EMR: Yes  Reviewed Medications In EMR: Yes  Reviewed Allergies In EMR: Yes  Reviewed Surgeries / Procedures: Yes  Date of Onset of Symptoms: 04/18/2012 OB / GYN:  LMP: Unknown  Guideline(s) Used:  No Protocol Available - Information Only  Disposition Per Guideline:   Home Care  Reason For Disposition Reached:   Information only question and nurse able to answer  Advice Given:  N/A

## 2012-05-09 NOTE — Telephone Encounter (Signed)
Refilled tramadol.  How is abd pain? If persistent despite regular omeprazole use, recommend referral to GI for further evaluation.

## 2012-05-09 NOTE — Telephone Encounter (Signed)
OK to refill

## 2012-05-10 NOTE — Telephone Encounter (Signed)
Patient notified. She said the pain comes and goes, but when she has it, it is the same as it was when she came in to see you. She wants the GI referral because she feels that when the reflux really flares up, it feels as though something is stuck in her throat. She prefers [facility]. She will await call from [facility].

## 2012-05-10 NOTE — Telephone Encounter (Signed)
Placed referral  

## 2012-05-11 ENCOUNTER — Encounter: Payer: Self-pay | Admitting: Gastroenterology

## 2012-05-11 ENCOUNTER — Encounter: Payer: Self-pay | Admitting: Internal Medicine

## 2012-05-15 ENCOUNTER — Encounter (HOSPITAL_COMMUNITY): Payer: Self-pay

## 2012-05-18 ENCOUNTER — Ambulatory Visit (INDEPENDENT_AMBULATORY_CARE_PROVIDER_SITE_OTHER): Payer: 59 | Admitting: Gynecology

## 2012-05-18 ENCOUNTER — Encounter: Payer: Self-pay | Admitting: Gynecology

## 2012-05-18 DIAGNOSIS — N949 Unspecified condition associated with female genital organs and menstrual cycle: Secondary | ICD-10-CM

## 2012-05-18 DIAGNOSIS — R102 Pelvic and perineal pain: Secondary | ICD-10-CM

## 2012-05-18 NOTE — Patient Instructions (Signed)
Followup for surgery as scheduled. 

## 2012-05-18 NOTE — Progress Notes (Signed)
[name] [birth year] [id number]   Preoperative consult  Chief complaint: pelvic pain, dyspareunia  History of present illness: 47 y.o. [id number] status post LAVH 11/2010 for leiomyoma menorrhagia. Over the past year the patient has been having consistent right sided deep dyspareunia interrupting intercourse with every coital episode. Also intermittent pelvic pain throughout the month, comes and goes. Ultrasounds have shown right ovarian cysts which have come and gone without consistent findings. She does have a history of multiple abdominal surgeries but at the time of hysterectomy, other than thick uterine to anterior abdominal wall adhesions, the pelvis appeared clear of intestinal or other adhesions. Options for management were reviewed and ultimately the patient wants laparoscopic assessment of the pelvis rule out endometriosis or other disease as well as RSO regardless of findings even if it means removing a normal-appearing right ovary given the consistent right pelvic pain interrupting intercourse with every episode.  Past medical history,surgical history, medications, allergies, family history and social history were all reviewed and documented in the EPIC chart. ROS:  Was performed and pertinent positives and negatives are included in the history of present illness.  Exam:  Kim assistant General: well developed, well nourished female, no acute distress HEENT: normal  Lungs: clear to auscultation without wheezing, rales or rhonchi  Cardiac: regular rate without rubs, murmurs or gallops  Abdomen: soft, nontender without masses, guarding, rebound, organomegaly  Pelvic: external bus vagina: normal   Bimanual without masses or tenderness.    Assessment/Plan:  47 y.o. [id number] with the above history for laparoscopic right salpingo-oophorectomy. I reviewed the proposed surgery and the expected intraoperative/postoperative courses and recovery period. We discussed on several occasions about  removing her right ovary particularly it was normal in appearance and regardless she wants this ovary removed excepting normal ovary on pathology. She also clearly understands there are no guarantees of pain relief and that her pain may persist, worsen or change following the procedure and she understands and accepts this. We will leave her left ovary for continued hormone production at her choice. I reviewed with involved with the surgery to include multiple port sites, insufflation, trocar placement, use of sharp blunt dissection, electrocautery and harmonic scalpel.  The risks of the surgery to include infection, prolonged antibiotics, hemorrhage necessitating transfusion and the risks of transfusion including transfusion reaction, hepatitis, HIV, mad cow disease and other unknown entities. Incisional complications requiring opening and draining of incisions, closure by secondary intention, long-term issues of cosmetic/keloid and hernia formation were all reviewed with her. The risks of inadvertent injury to internal organs, particularly with her prior surgeries, including bowel, bladder, ureters, vessels and nerves, either immediately recognized or delay recognized, necessitating major exploratory reparative surgeries including ostomy formation, bowel resection, bladder repair, ureteral damage repair was all discussed understood and accepted. The patient's questions were answered to her satisfaction and she is ready to proceed with the surgery.    Dara Lords MD, 3:08 PM 05/18/2012

## 2012-05-18 NOTE — H&P (Signed)
  [name] 1965-08-03 [id number]   History and Physical  Chief complaint: pelvic pain, dyspareunia  History of present illness: 46 y.o. 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 Over the past year the patient has been having consistent right sided deep dyspareunia interrupting intercourse with every coital episode. Also intermittent pelvic pain throughout the month, comes and goes. Ultrasounds have shown right ovarian cysts which have come and gone without consistent findings. She does have a history of multiple abdominal surgeries but at the time of hysterectomy, other than thick uterine to anterior abdominal wall adhesions, the pelvis appeared clear of intestinal or other adhesions. Options for management were reviewed and ultimately the patient wants laparoscopic assessment of the pelvis rule out endometriosis or other disease as well as RSO regardless of findings even if it means removing a normal-appearing right ovary given the consistent right pelvic pain interrupting intercourse with every episode.  Past medical history,surgical history, medications, allergies, family history and social history were all reviewed and documented in the EPIC chart. ROS:  Was performed and pertinent positives and negatives are included in the history of present illness.  Exam:  Kim assistant General: well developed, well nourished female, no acute distress HEENT: normal  Lungs: clear to auscultation without wheezing, rales or rhonchi  Cardiac: regular rate without rubs, murmurs or gallops  Abdomen: soft, nontender without masses, guarding, rebound, organomegaly  Pelvic: external bus vagina: normal   Bimanual without masses or tenderness.    Assessment/Plan:  47 y.o. [id number] with the above history for laparoscopic right salpingo-oophorectomy. I reviewed the proposed surgery and the expected intraoperative/postoperative courses and recovery period. We discussed on several occasions about  removing her right ovary particularly it was normal in appearance and regardless she wants this ovary removed excepting normal ovary on pathology. She also clearly understands there are no guarantees of pain relief and that her pain may persist, worsen or change following the procedure and she understands and accepts this. We will leave her left ovary for continued hormone production at her choice. I reviewed with involved with the surgery to include multiple port sites, insufflation, trocar placement, use of sharp blunt dissection, electrocautery and harmonic scalpel.  The risks of the surgery to include infection, prolonged antibiotics, hemorrhage necessitating transfusion and the risks of transfusion including transfusion reaction, hepatitis, HIV, mad cow disease and other unknown entities. Incisional complications requiring opening and draining of incisions, closure by secondary intention, long-term issues of cosmetic/keloid and hernia formation were all reviewed with her. The risks of inadvertent injury to internal organs, particularly with her prior surgeries, including bowel, bladder, ureters, vessels and nerves, either immediately recognized or delay recognized, necessitating major exploratory reparative surgeries including ostomy formation, bowel resection, bladder repair, ureteral damage repair was all discussed understood and accepted. The patient's questions were answered to her satisfaction and she is ready to proceed with the surgery.    Dara Lords MD, 3:18 PM 05/18/2012

## 2012-05-22 ENCOUNTER — Encounter (HOSPITAL_COMMUNITY)
Admission: RE | Admit: 2012-05-22 | Discharge: 2012-05-22 | Disposition: A | Discharge status: Home / Self Care | Payer: 59 | Source: Ambulatory Visit | Attending: Gynecology | Admitting: Gynecology

## 2012-05-22 ENCOUNTER — Encounter (HOSPITAL_COMMUNITY): Payer: Self-pay

## 2012-05-22 LAB — CBC
HCT: 34.7 % — ABNORMAL LOW (ref 36.0–46.0)
MCH: 26.3 pg (ref 26.0–34.0)
MCV: 73.5 fL — ABNORMAL LOW (ref 78.0–100.0)
Platelets: 351 10*3/uL (ref 150–400)
RBC: 4.72 MIL/uL (ref 3.87–5.11)

## 2012-05-22 NOTE — Patient Instructions (Addendum)
   Your procedure is scheduled AO:[id number] February 20th  Enter through the Main Entrance of [facility] at:6am Pick up the phone at the desk and dial [phone] and inform us of your arrival.  Please call this number if you have any problems the morning of surgery: [phone]  Remember: Do not eat or drink anything after midnight on Wednesday You may take your omeprazole morning of surgery with sips of water  Do not wear jewelry, make-up, or FINGER nail polish No metal in your hair or on your body. Do not wear lotions, powders, perfumes. You may wear deodorant.  Please use your CHG wash as directed prior to surgery.  Do not shave anywhere for at least 12 hours prior to first CHG shower.  Do not bring valuables to the hospital. Please bring a case for your eyeglasses  Leave suitcase in the car. After Surgery it may be brought to your room. For patients being admitted to the hospital, checkout time is 11:00am the day of discharge.  Patients discharged on the day of surgery will not be allowed to drive home.

## 2012-05-23 ENCOUNTER — Encounter (HOSPITAL_COMMUNITY): Payer: Self-pay | Admitting: Anesthesiology

## 2012-05-23 MED ORDER — DEXTROSE 5 % IV SOLN
2.0000 g | INTRAVENOUS | Status: AC
  Administered 2012-05-24: 2 g via INTRAVENOUS
  Filled 2012-05-23: qty 2

## 2012-05-23 NOTE — Anesthesia Preprocedure Evaluation (Addendum)
Anesthesia Evaluation  Patient identified by MRN, date of birth, ID band Patient awake    Reviewed: Allergy & Precautions, H&P , NPO status , Patient's Chart, lab work & pertinent test results  Airway Mallampati: II TM Distance: >3 FB Neck ROM: Full    Dental no notable dental hx. (+) Teeth Intact   Pulmonary  PPD + breath sounds clear to auscultation  Pulmonary exam normal       Cardiovascular negative cardio ROS  Rhythm:Regular Rate:Normal     Neuro/Psych negative neurological ROS  negative psych ROS   GI/Hepatic Neg liver ROS, hiatal hernia, GERD-  Medicated,  Endo/Other  negative endocrine ROS  Renal/GU Renal diseaseHx/o renal calculi  negative genitourinary   Musculoskeletal negative musculoskeletal ROS (+)   Abdominal Normal abdominal exam  (+)   Peds  Hematology  (+) Blood dyscrasia, Sickle cell trait and anemia ,   Anesthesia Other Findings   Reproductive/Obstetrics negative OB ROS Dyspareunia Pelvic Pain                         Anesthesia Physical Anesthesia Plan  ASA: II  Anesthesia Plan: General   Post-op Pain Management:    Induction: Intravenous  Airway Management Planned: Oral ETT  Additional Equipment:   Intra-op Plan:   Post-operative Plan: Extubation in OR  Informed Consent: I have reviewed the patients History and Physical, chart, labs and discussed the procedure including the risks, benefits and alternatives for the proposed anesthesia with the patient or authorized representative who has indicated his/her understanding and acceptance.   Dental advisory given  Plan Discussed with: CRNA, Anesthesiologist and Surgeon  Anesthesia Plan Comments:         Anesthesia Quick Evaluation

## 2012-05-24 ENCOUNTER — Ambulatory Visit (HOSPITAL_COMMUNITY)
Admission: RE | Admit: 2012-05-24 | Discharge: 2012-05-24 | Disposition: A | Discharge status: Home / Self Care | Payer: 59 | Source: Ambulatory Visit | Attending: Gynecology | Admitting: Gynecology

## 2012-05-24 ENCOUNTER — Encounter (HOSPITAL_COMMUNITY): Payer: Self-pay | Admitting: Anesthesiology

## 2012-05-24 ENCOUNTER — Ambulatory Visit (HOSPITAL_COMMUNITY): Payer: 59 | Admitting: Anesthesiology

## 2012-05-24 ENCOUNTER — Encounter (HOSPITAL_COMMUNITY): Payer: Self-pay | Admitting: *Deleted

## 2012-05-24 ENCOUNTER — Encounter (HOSPITAL_COMMUNITY)
Admission: RE | Disposition: A | Discharge status: Home / Self Care | Payer: Self-pay | Source: Ambulatory Visit | Attending: Gynecology

## 2012-05-24 DIAGNOSIS — N736 Female pelvic peritoneal adhesions (postinfective): Secondary | ICD-10-CM

## 2012-05-24 DIAGNOSIS — K668 Other specified disorders of peritoneum: Secondary | ICD-10-CM

## 2012-05-24 DIAGNOSIS — N898 Other specified noninflammatory disorders of vagina: Secondary | ICD-10-CM | POA: Insufficient documentation

## 2012-05-24 DIAGNOSIS — IMO0002 Reserved for concepts with insufficient information to code with codable children: Secondary | ICD-10-CM | POA: Insufficient documentation

## 2012-05-24 DIAGNOSIS — N949 Unspecified condition associated with female genital organs and menstrual cycle: Secondary | ICD-10-CM

## 2012-05-24 DIAGNOSIS — Z90710 Acquired absence of both cervix and uterus: Secondary | ICD-10-CM | POA: Insufficient documentation

## 2012-05-24 HISTORY — PX: LAPAROSCOPY: SHX197

## 2012-05-24 HISTORY — PX: SALPINGOOPHORECTOMY: SHX82

## 2012-05-24 SURGERY — LAPAROSCOPY OPERATIVE
Anesthesia: General | Site: Abdomen | Laterality: Right | Wound class: Clean Contaminated

## 2012-05-24 MED ORDER — MIDAZOLAM HCL 5 MG/5ML IJ SOLN
INTRAMUSCULAR | Status: DC | PRN
  Administered 2012-05-24: 2 mg via INTRAVENOUS

## 2012-05-24 MED ORDER — MEPERIDINE HCL 25 MG/ML IJ SOLN: 6.2500 mg | INTRAMUSCULAR | Status: DC | PRN

## 2012-05-24 MED ORDER — BUPIVACAINE HCL (PF) 0.25 % IJ SOLN
INTRAMUSCULAR | Status: DC | PRN
  Administered 2012-05-24: 5 mL

## 2012-05-24 MED ORDER — GLYCOPYRROLATE 0.2 MG/ML IJ SOLN
INTRAMUSCULAR | Status: DC | PRN
  Administered 2012-05-24: 0.6 mg via INTRAVENOUS

## 2012-05-24 MED ORDER — HYDROMORPHONE HCL PF 1 MG/ML IJ SOLN
0.2500 mg | INTRAMUSCULAR | Status: DC | PRN
  Administered 2012-05-24 (×2): 0.5 mg via INTRAVENOUS

## 2012-05-24 MED ORDER — METOCLOPRAMIDE HCL 5 MG/ML IJ SOLN
INTRAMUSCULAR | Status: DC | PRN
  Administered 2012-05-24: 10 mg via INTRAVENOUS

## 2012-05-24 MED ORDER — BUPIVACAINE HCL (PF) 0.25 % IJ SOLN
INTRAMUSCULAR | Status: AC
  Filled 2012-05-24: qty 30

## 2012-05-24 MED ORDER — ROCURONIUM BROMIDE 100 MG/10ML IV SOLN
INTRAVENOUS | Status: DC | PRN
  Administered 2012-05-24: 35 mg via INTRAVENOUS

## 2012-05-24 MED ORDER — NEOSTIGMINE METHYLSULFATE 1 MG/ML IJ SOLN
INTRAMUSCULAR | Status: AC
  Filled 2012-05-24: qty 1

## 2012-05-24 MED ORDER — ROCURONIUM BROMIDE 50 MG/5ML IV SOLN
INTRAVENOUS | Status: AC
  Filled 2012-05-24: qty 1

## 2012-05-24 MED ORDER — ONDANSETRON HCL 4 MG/2ML IJ SOLN
INTRAMUSCULAR | Status: AC
  Filled 2012-05-24: qty 2

## 2012-05-24 MED ORDER — OXYCODONE-ACETAMINOPHEN 5-325 MG PO TABS: 2.0000 | ORAL_TABLET | Freq: Four times a day (QID) | ORAL | Status: DC | PRN

## 2012-05-24 MED ORDER — METOCLOPRAMIDE HCL 5 MG/ML IJ SOLN: 10.0000 mg | Freq: Once | INTRAMUSCULAR | Status: DC | PRN

## 2012-05-24 MED ORDER — LACTATED RINGERS IV SOLN
INTRAVENOUS | Status: DC
Start: 2012-05-24 — End: 2012-05-24
  Administered 2012-05-24 (×2): via INTRAVENOUS

## 2012-05-24 MED ORDER — LACTATED RINGERS IR SOLN
Status: DC | PRN
  Administered 2012-05-24: 3000 mL

## 2012-05-24 MED ORDER — KETOROLAC TROMETHAMINE 30 MG/ML IJ SOLN
INTRAMUSCULAR | Status: DC | PRN
  Administered 2012-05-24: 30 mg via INTRAVENOUS

## 2012-05-24 MED ORDER — METHYLENE BLUE 1 % INJ SOLN
INTRAMUSCULAR | Status: AC
  Filled 2012-05-24: qty 1

## 2012-05-24 MED ORDER — PROPOFOL 10 MG/ML IV EMUL
INTRAVENOUS | Status: AC
  Filled 2012-05-24: qty 20

## 2012-05-24 MED ORDER — ONDANSETRON HCL 4 MG/2ML IJ SOLN
INTRAMUSCULAR | Status: DC | PRN
  Administered 2012-05-24: 4 mg via INTRAVENOUS

## 2012-05-24 MED ORDER — MIDAZOLAM HCL 2 MG/2ML IJ SOLN
INTRAMUSCULAR | Status: AC
  Filled 2012-05-24: qty 2

## 2012-05-24 MED ORDER — KETOROLAC TROMETHAMINE 30 MG/ML IJ SOLN
INTRAMUSCULAR | Status: AC
  Filled 2012-05-24: qty 1

## 2012-05-24 MED ORDER — LIDOCAINE HCL (CARDIAC) 20 MG/ML IV SOLN
INTRAVENOUS | Status: AC
  Filled 2012-05-24: qty 5

## 2012-05-24 MED ORDER — FENTANYL CITRATE 0.05 MG/ML IJ SOLN
INTRAMUSCULAR | Status: DC | PRN
  Administered 2012-05-24: 50 ug via INTRAVENOUS
  Administered 2012-05-24 (×2): 100 ug via INTRAVENOUS
  Administered 2012-05-24 (×3): 50 ug via INTRAVENOUS

## 2012-05-24 MED ORDER — DEXAMETHASONE SODIUM PHOSPHATE 4 MG/ML IJ SOLN
INTRAMUSCULAR | Status: DC | PRN
  Administered 2012-05-24: 8 mg via INTRAVENOUS

## 2012-05-24 MED ORDER — NEOSTIGMINE METHYLSULFATE 1 MG/ML IJ SOLN
INTRAMUSCULAR | Status: DC | PRN
  Administered 2012-05-24: 3 mg via INTRAVENOUS

## 2012-05-24 MED ORDER — METOCLOPRAMIDE HCL 5 MG/ML IJ SOLN
INTRAMUSCULAR | Status: AC
  Filled 2012-05-24: qty 2

## 2012-05-24 MED ORDER — LIDOCAINE HCL (CARDIAC) 20 MG/ML IV SOLN
INTRAVENOUS | Status: DC | PRN
  Administered 2012-05-24: 50 mg via INTRAVENOUS

## 2012-05-24 MED ORDER — HYDROMORPHONE HCL PF 1 MG/ML IJ SOLN
INTRAMUSCULAR | Status: AC
  Administered 2012-05-24: 0.5 mg via INTRAVENOUS
  Filled 2012-05-24: qty 1

## 2012-05-24 MED ORDER — GLYCOPYRROLATE 0.2 MG/ML IJ SOLN
INTRAMUSCULAR | Status: AC
  Filled 2012-05-24: qty 3

## 2012-05-24 MED ORDER — DEXAMETHASONE SODIUM PHOSPHATE 10 MG/ML IJ SOLN
INTRAMUSCULAR | Status: AC
  Filled 2012-05-24: qty 1

## 2012-05-24 MED ORDER — FENTANYL CITRATE 0.05 MG/ML IJ SOLN
INTRAMUSCULAR | Status: AC
  Filled 2012-05-24: qty 10

## 2012-05-24 SURGICAL SUPPLY — 26 items
ADH SKN CLS APL DERMABOND .7 (GAUZE/BANDAGES/DRESSINGS) ×4
BAG SPEC RTRVL LRG 6X4 10 (ENDOMECHANICALS) ×2
BARRIER ADHS 3X4 INTERCEED (GAUZE/BANDAGES/DRESSINGS) IMPLANT
BLADE SURG 15 STRL LF C SS BP (BLADE) ×2 IMPLANT
BLADE SURG 15 STRL SS (BLADE) ×3
BRR ADH 4X3 ABS CNTRL BYND (GAUZE/BANDAGES/DRESSINGS)
CABLE HIGH FREQUENCY MONO STRZ (ELECTRODE) IMPLANT
CATH ROBINSON RED A/P 16FR (CATHETERS) ×3 IMPLANT
CLOTH BEACON ORANGE TIMEOUT ST (SAFETY) ×3 IMPLANT
DERMABOND ADVANCED (GAUZE/BANDAGES/DRESSINGS) ×2
DERMABOND ADVANCED .7 DNX12 (GAUZE/BANDAGES/DRESSINGS) IMPLANT
GLOVE BIO SURGEON STRL SZ7.5 (GLOVE) ×6 IMPLANT
GOWN PREVENTION PLUS LG XLONG (DISPOSABLE) ×6 IMPLANT
NS IRRIG 1000ML POUR BTL (IV SOLUTION) ×3 IMPLANT
PACK LAPAROSCOPY BASIN (CUSTOM PROCEDURE TRAY) ×3 IMPLANT
POUCH SPECIMEN RETRIEVAL 10MM (ENDOMECHANICALS) ×1 IMPLANT
PROTECTOR NERVE ULNAR (MISCELLANEOUS) ×3 IMPLANT
SCALPEL HARMONIC ACE (MISCELLANEOUS) ×1 IMPLANT
SET IRRIG TUBING LAPAROSCOPIC (IRRIGATION / IRRIGATOR) ×1 IMPLANT
SUT PLAIN 4 0 FS 2 27 (SUTURE) ×3 IMPLANT
SUT VICRYL 0 UR6 27IN ABS (SUTURE) ×3 IMPLANT
TOWEL OR 17X24 6PK STRL BLUE (TOWEL DISPOSABLE) ×6 IMPLANT
TROCAR XCEL NON-BLD 11X100MML (ENDOMECHANICALS) ×3 IMPLANT
TROCAR XCEL NON-BLD 5MMX100MML (ENDOMECHANICALS) ×3 IMPLANT
WARMER LAPAROSCOPE (MISCELLANEOUS) ×3 IMPLANT
WATER STERILE IRR 1000ML POUR (IV SOLUTION) ×3 IMPLANT

## 2012-05-24 NOTE — Op Note (Signed)
[name] 1965/08/11 [id number]   Post Operative Note   Date of surgery:  05/24/2012  Pre Op Dx:  Pelvic pain, dyspareunia  Post Op Dx:  Pelvic pain, dyspareunia, right periovarian adhesions, vaginal cuff cyst  Procedure:  Laparoscopic right salpingo-oophorectomy, lysis of adhesions, excision vaginal cuff cyst  Surgeon:  Dara Lords  Assistant:  Reynaldo Minium  Anesthesia:  General  EBL:  Minimal  Complications:  None  Specimen:  #1 right ovary and bloating tube segment #2 vaginal cuff cyst to pathology  Findings: EUA:  External BUS vagina normal. EUA without masses   Operative:  2 cm simple cyst apex of the vaginal cuff, excised in its entirety. Left ovary and fallopian tube segment normal free and mobile. Right ovary and fallopian tube segment grossly normal with adhesions securing it to the right pelvic sidewall. No other evidence of pelvic adhesive disease or endometriosis. Upper abdominal exam shows liver smooth no abnormalities. Gallbladder grossly normal. Cecal area without adhesions, appendix not visualized. No upper abdominal adhesive disease noted.  Procedure:  The patient was taken to the operating room, placed in the low dorsal lithotomy position, underwent general anesthesia, received an abdominal/perineal/vaginal preparation with Betadine solution by nursing personnel and the bladder was emptied with in and out Foley catheterization. EUA was performed and a sponge stick was placed within the vagina. A time out was performed by the surgical team. The patient was draped in the usual fashion and a repeat transverse infraumbilical incision was made. Using the 10 mm Optiview trocar the abdomen was directly entered under direct visualization without difficulty. Right and left 5 mm suprapubic ports were placed under direct visualization after transillumination for the vessels without difficulty and examination of the pelvic organs and upper exam was carried out with  findings noted above. The right ovary and fallopian tube segment was elevated, the right ureter identified and traced in its course through the pelvis and the infundibulopelvic ligament and vessels were subsequently transected using the harmonic scalpel. Lysing the adhesions to the sidewall and the remaining tissue attachments the ovary and fallopian tube were freed. Using a 5 mm laparoscope through the right suprapubic port and an Endopouch through the infraumbilical port the ovary and fallopian tube segment were retrieved and sent to pathology. Again using the 10 mm laparoscope through the infraumbilical port the vaginal cyst was elevated and incised along its attachment to the peritoneal surface and removed in its entirety. This was retrieved and sent to pathology. The pelvis was then copiously irrigated showing adequate hemostasis. The gas was allowed to escape and again hemostasis visualized under low pressure situation. The 5 mm ports were removed showing adequate hemostasis and no evidence of hernia formation and the 10 mm port was backed out under direct visualization showing adequate hemostasis no evidence of hernia formation. All skin incisions were injected using 0.25% Marcaine and the infraumbilical fascia was reapproximated with a short running stitch using 0 Vicryl suture. The infraumbilical skin was reapproximated using 4-0 plain suture in a running subcuticular stitch and Dermabond skin adhesive was applied over this. The right and left suprapubic ports were closed using Dermabond skin adhesive. The patient received intraoperative Toradol, was placed in the supine position, was awakened without difficulty and taken to the recovery room in good condition having tolerated the procedure well.    Dara Lords MD, 8:53 AM 05/24/2012

## 2012-05-24 NOTE — H&P (Signed)
The patient was examined.  I reviewed the proposed surgery and consent form with the patient.  Right side agreed upon and marked.  The dictated history and physical is current and accurate and all questions were answered. The patient is ready to proceed with surgery and has a realistic understanding and expectation for the outcome.   Dara Lords MD, 7:06 AM 05/24/2012

## 2012-05-24 NOTE — Anesthesia Postprocedure Evaluation (Signed)
  Anesthesia Post Note  Patient: [name]  Procedure(s) Performed: Procedure(s) (LRB): LAPAROSCOPY OPERATIVE (N/A) SALPINGO OOPHORECTOMY (Right)  Anesthesia type: GA  Patient location: PACU  Post pain: Pain level controlled  Post assessment: Post-op Vital signs reviewed  Last Vitals:  Filed Vitals:   05/24/12 0845  BP: 101/57  Pulse: 76  Temp:   Resp: 18    Post vital signs: Reviewed  Level of consciousness: sedated  Complications: No apparent anesthesia complications

## 2012-05-24 NOTE — Transfer of Care (Signed)
Immediate Anesthesia Transfer of Care Note  Patient: [name]  Procedure(s) Performed: Procedure(s): LAPAROSCOPY OPERATIVE (N/A) SALPINGO OOPHORECTOMY (Right)  Patient Location: PACU  Anesthesia Type:General  Level of Consciousness: awake, alert , oriented and patient cooperative  Airway & Oxygen Therapy: Patient Spontanous Breathing and Patient connected to nasal cannula oxygen  Post-op Assessment: Report given to PACU RN and Post -op Vital signs reviewed and stable  Post vital signs: Reviewed and stable  Complications: No apparent anesthesia complications

## 2012-05-25 ENCOUNTER — Ambulatory Visit (INDEPENDENT_AMBULATORY_CARE_PROVIDER_SITE_OTHER): Payer: 59 | Admitting: Gynecology

## 2012-05-25 ENCOUNTER — Encounter (HOSPITAL_COMMUNITY): Payer: Self-pay | Admitting: Gynecology

## 2012-05-25 ENCOUNTER — Telehealth: Payer: Self-pay

## 2012-05-25 VITALS — Temp 99.4°F

## 2012-05-25 DIAGNOSIS — M549 Dorsalgia, unspecified: Secondary | ICD-10-CM

## 2012-05-25 LAB — URINALYSIS W MICROSCOPIC + REFLEX CULTURE
Hgb urine dipstick: NEGATIVE
Protein, ur: NEGATIVE mg/dL
Urobilinogen, UA: 1 mg/dL (ref 0.0–1.0)

## 2012-05-25 MED ORDER — IBUPROFEN 800 MG PO TABS: 800.0000 mg | ORAL_TABLET | Freq: Three times a day (TID) | ORAL | Status: DC | PRN

## 2012-05-25 MED ORDER — DIAZEPAM 5 MG PO TABS: 5.0000 mg | ORAL_TABLET | Freq: Four times a day (QID) | ORAL | Status: DC | PRN

## 2012-05-25 NOTE — Telephone Encounter (Signed)
Surgery yesterday. Patient called in voice mail stating pain medication not working what can she do.  I called her back and upon discussion she states she is having right sided abd and back pain.  On a scale of 1-10 her pain is a 7.  She is having some urinary tract symptoms in that she is experiencing frequency and when she goes to urinate is having to push to be able to urinate "hard to get it to come out".  I recommended office visit this afternoon and she is agreeable. She said she had uti after her hysterectomy and she wonders if this is not the same.  [name] spoke with her and scheduled her to come over this afternoon.

## 2012-05-25 NOTE — Patient Instructions (Signed)
Take Valium every 6 hours as needed for low back pain and to help you relax. Take ibuprofen 800 mg every 8 hours for discomfort. Call if increasing discomfort, nausea vomiting increasing lower, pain fevers or any other concerning symptoms.  Otherwise follow up in 2 weeks at your postoperative appointment.

## 2012-05-25 NOTE — Progress Notes (Signed)
Patient presents one day postop status post laparoscopic RSO excision of vaginal cuff peritoneal cyst. Complaints of low back pain and some pelvic pressure. No fever chills nausea vomiting. Is passing flatus although has not had a bowel movement. Voiding with slight stinging but good volumes without suprapubic pain. Drinking freely not really hungry.  Exam was Berenice Bouton Afebrile Spine straight no CVA tenderness Abdomen soft with active bowel sounds throughout. Minimal tenderness to deep palpation right and left lower quadrants. No rebound guarding masses or organomegaly. Incisions all intact with Dermabond. Pelvic external BUS vagina normal. Bimanual without masses or tenderness.  Urinalysis unremarkable. 0-2 WBC. Will check culture. Does have calcium oxalate.  Assessment and plan: Postop status post laparoscopic RSO excision of vaginal cuff cyst. Reviewed pictures and pathology which was benign with her. I think her symptoms are normal postoperative symptoms. Will hold on lab/study evaluation at this time.  No overt evidence of more concerning injury such as bowel ureter bladder. Question whether low back pain is related to positioning on table. We'll treat with Valium 5 mg #20 one by mouth every 6 hours as a muscle relaxant. Ibuprofen 800 mg #30 one by mouth Q8 hour period he to her back. Follow up if symptoms worsen with ASAP call precautions reviewed. Otherwise assuming she does well she'll see me in 2 weeks after normal postoperative check. She does have a history of renal lithiasis although circumstantially since discomfort has started postoperative do not think is related to this.

## 2012-05-27 LAB — URINE CULTURE: Colony Count: 3000

## 2012-06-01 ENCOUNTER — Other Ambulatory Visit: Payer: Self-pay | Admitting: Family Medicine

## 2012-06-01 NOTE — Telephone Encounter (Signed)
Please give her 72 with one refil, thanks

## 2012-06-01 NOTE — Telephone Encounter (Signed)
done

## 2012-06-01 NOTE — Telephone Encounter (Signed)
Ok to refill 

## 2012-06-04 ENCOUNTER — Encounter: Payer: Self-pay | Admitting: Gastroenterology

## 2012-06-04 ENCOUNTER — Ambulatory Visit (INDEPENDENT_AMBULATORY_CARE_PROVIDER_SITE_OTHER): Payer: 59 | Admitting: Gastroenterology

## 2012-06-04 ENCOUNTER — Ambulatory Visit: Payer: 59 | Admitting: Internal Medicine

## 2012-06-04 VITALS — BP 100/70 | HR 68 | Ht 62.5 in | Wt 148.8 lb

## 2012-06-04 DIAGNOSIS — R079 Chest pain, unspecified: Secondary | ICD-10-CM

## 2012-06-04 DIAGNOSIS — K219 Gastro-esophageal reflux disease without esophagitis: Secondary | ICD-10-CM

## 2012-06-04 NOTE — Progress Notes (Signed)
History of Present Illness: This is a 47 year old female accompanied by her husband. She complains of lower sternal and epigastric pain that is relieved by tramadol for the past several weeks. She has had intermittent difficulties with reflux years with recurrent symptoms over the past several weeks and she resumed omeprazole as well. Over the past 2-3 days she notes a globus sensation in her neck. She recently underwent a right salpingo-oophorectomy. An abdominal ultrasound in 2012 was negative except for nephrolithiasis. Abdominal/pelvic CT scan in March 2013 showed only bilateral renal stones. She previously underwent upper endoscopies in 1997, 2000 and 2001. Findings on those exams included Candida esophagitis, duodenitis, small sliding hiatal hernia and distal esophageal erythema. Denies weight loss, constipation, diarrhea, change in stool caliber, melena, hematochezia, nausea, vomiting, dysphagia.  Review of Systems: Pertinent positive and negative review of systems were noted in the above HPI section. All other review of systems were otherwise negative.  Current Medications, Allergies, Past Medical History, Past Surgical History, Family History and Social History were reviewed in [employer] record.  Physical Exam: General: Well developed , well nourished, no acute distress Head: Normocephalic and atraumatic Eyes:  sclerae anicteric, EOMI Ears: Normal auditory acuity Mouth: No deformity or lesions Neck: Supple, no masses or thyromegaly Lungs: Clear throughout to auscultation, moderate tenderness over her lower sternum and xiphoid process Heart: Regular rate and rhythm; no murmurs, rubs or bruits Abdomen: Soft, minimal epigastric tenderness without rebound or guarding and non distended. No masses, hepatosplenomegaly or hernias noted. Normal Bowel sounds Musculoskeletal: Symmetrical with no gross deformities  Skin: No lesions on visible extremities Pulses:  Normal pulses  noted Extremities: No clubbing, cyanosis, edema or deformities noted Neurological: Alert oriented x 4, grossly nonfocal Cervical Nodes:  No significant cervical adenopathy Inguinal Nodes: No significant inguinal adenopathy Psychological:  Alert and cooperative. Normal mood and affect  Assessment and Recommendations:  1. GERD and globus sensation. Standard antireflux measures. Schedule upper endoscopy to further evaluate for esophagitis and ulcer disease. The risks, benefits, and alternatives to endoscopy with possible biopsy and possible dilation were discussed with the patient and they consent to proceed.   2. Chest wall pain. Chest wall tenderness on exam. Continue tramadol as needed. Return to Dr. Sharen Hones for management of lower sternal chest wall pain.  3. Colorectal cancer screening, average risk. Colonoscopy at age 24.

## 2012-06-04 NOTE — Patient Instructions (Addendum)
You have been scheduled for an endoscopy with propofol. Please follow written instructions given to you at your visit today. If you use inhalers (even only as needed) or a CPAP machine, please bring them with you on the day of your procedure.  Patient advised to avoid spicy, acidic, citrus, chocolate, mints, fruit and fruit juices.  Limit the intake of caffeine, alcohol and Soda.  Don't exercise too soon after eating.  Don't lie down within 3-4 hours of eating.  Elevate the head of your bed.  Thank you for choosing me and [facility].  Malcolm T. Stark, Jr., MD., FACG  

## 2012-06-07 ENCOUNTER — Encounter: Payer: Self-pay | Admitting: Gynecology

## 2012-06-07 ENCOUNTER — Ambulatory Visit (INDEPENDENT_AMBULATORY_CARE_PROVIDER_SITE_OTHER): Payer: 59 | Admitting: Gynecology

## 2012-06-07 LAB — URINALYSIS W MICROSCOPIC + REFLEX CULTURE
Casts: NONE SEEN
Crystals: NONE SEEN
Nitrite: NEGATIVE
Specific Gravity, Urine: 1.01 (ref 1.005–1.030)
Urobilinogen, UA: 0.2 mg/dL (ref 0.0–1.0)
pH: 6 (ref 5.0–8.0)

## 2012-06-07 NOTE — Patient Instructions (Signed)
Office will contact you if urinalysis shows infection.  Followup in November for annual exam. Sooner if any issues.

## 2012-06-07 NOTE — Progress Notes (Signed)
Patient presents for postoperative check up status post laparoscopic RSO and excision of peritoneal cyst overlying vaginal cuff.  She was seen one day post operative do to back pain and pelvic pressure. These symptoms have all resolved. I reviewed pathology and pictures with her at that time. Reports doing well now except for several days of minimal dysuria. No fever chills nausea vomiting diarrhea constipation.  Exam with Selena Batten assistant No CVA tenderness Abdomen soft nontender without masses guarding rebound organomegaly. Incisions healing nicely. Small not 5 mm under infraumbilical incision questionable small hematoma or from suture. Regardless nontender noninflammatory skin incisions closed nicely. Pelvic external BUS vagina normal bimanual without masses or tenderness  Assessment and plan:   Status post laparoscopic RSO excision of peritoneal cyst overlying vaginal cuff. Doing well. Pathology showed benign ovary and cyst. Again reviewed pictures with her and gave her copies of them.  Mild dysuria with normal exam. UA questionable low-grade UTI Will follow for culture and treat accordingly. Recommend pushing fluids at present.  Small knot in the underlying skin of the infraumbilical incision site. Questionable due to suture versus small hematoma. Recommend to keep the area and I suspect this will resolve over time.  Followup in November when she is due for her annual, sooner as needed.

## 2012-06-09 LAB — URINE CULTURE: Colony Count: NO GROWTH

## 2012-06-13 ENCOUNTER — Telehealth: Payer: Self-pay | Admitting: *Deleted

## 2012-06-13 MED ORDER — FLUCONAZOLE 150 MG PO TABS: 150.0000 mg | ORAL_TABLET | Freq: Once | ORAL | Status: DC

## 2012-06-13 NOTE — Telephone Encounter (Signed)
Pt calling c/o yeast infection light itching and white discharge, she asked if Rx could be giving. Pt last seen on 06/07/12 for post op laparoscopic RSO. Please advise

## 2012-06-13 NOTE — Telephone Encounter (Signed)
Pt informed with the below note. 

## 2012-06-13 NOTE — Telephone Encounter (Signed)
Diflucan 150 mg x1. Order placed

## 2012-06-15 ENCOUNTER — Encounter: Payer: Self-pay | Admitting: Gastroenterology

## 2012-06-15 ENCOUNTER — Ambulatory Visit (AMBULATORY_SURGERY_CENTER): Payer: 59 | Admitting: Gastroenterology

## 2012-06-15 VITALS — BP 119/74 | HR 78 | Temp 98.8°F | Resp 16 | Ht 62.5 in | Wt 148.0 lb

## 2012-06-15 DIAGNOSIS — K219 Gastro-esophageal reflux disease without esophagitis: Secondary | ICD-10-CM

## 2012-06-15 HISTORY — PX: ESOPHAGOGASTRODUODENOSCOPY: SHX1529

## 2012-06-15 MED ORDER — SODIUM CHLORIDE 0.9 % IV SOLN: 500.0000 mL | INTRAVENOUS | Status: DC

## 2012-06-15 NOTE — Op Note (Signed)
[facility] [address] [place], [address]   ENDOSCOPY PROCEDURE REPORT  PATIENT: Cindy, Guzman.  MR#: [id number] BIRTHDATE: Feb 25, 1966 , 46  yrs. old GENDER: Female ENDOSCOPIST: Meryl Dare, MD, [facility] REFERRED BY:  Eustaquio Boyden, M.D. PROCEDURE DATE:  06/15/2012 PROCEDURE:  EGD, diagnostic ASA CLASS:     Class II INDICATIONS:  History of esophageal reflux. Chest pain. MEDICATIONS: MAC sedation, administered by CRNA and propofol (Diprivan) 170mg  IV TOPICAL ANESTHETIC: none DESCRIPTION OF PROCEDURE: After the risks benefits and alternatives of the procedure were thoroughly explained, informed consent was obtained.  The LB GIF-H180 [id number] endoscope was introduced through the mouth and advanced to the second portion of the duodenum without limitations.  The instrument was slowly withdrawn as the mucosa was fully examined.  ESOPHAGUS: The mucosa of the esophagus appeared normal. STOMACH: The mucosa and folds of the stomach appeared normal. DUODENUM: The duodenal mucosa showed no abnormalities in the bulb and second portion of the duodenum.  Retroflexed views revealed no abnormalities.  The scope was then withdrawn from the patient and the procedure completed.  COMPLICATIONS: There were no complications.  ENDOSCOPIC IMPRESSION: 1.   EGD appeared normal  RECOMMENDATIONS: 1.  Anti-reflux regimen 2.  Continue PPI   eSigned:  Meryl Dare, MD, [facility] 06/15/2012 3:27 PM

## 2012-06-15 NOTE — Patient Instructions (Addendum)
A handout was given to your care partner on GERD or anti-reflux regimen.  Per Dr. Russella Dar continue taking your acid reducing medication.  You may also resume your current medications today.  Please call if any questions or concerns.     YOU HAD AN ENDOSCOPIC PROCEDURE TODAY AT THE [facility]: Refer to the procedure report that was given to you for any specific questions about what was found during the examination.  If the procedure report does not answer your questions, please call your gastroenterologist to clarify.  If you requested that your care partner not be given the details of your procedure findings, then the procedure report has been included in a sealed envelope for you to review at your convenience later.  YOU SHOULD EXPECT: Some feelings of bloating in the abdomen. Passage of more gas than usual.  Walking can help get rid of the air that was put into your GI tract during the procedure and reduce the bloating. If you had a lower endoscopy (such as a colonoscopy or flexible sigmoidoscopy) you may notice spotting of blood in your stool or on the toilet paper. If you underwent a bowel prep for your procedure, then you may not have a normal bowel movement for a few days.  DIET: Your first meal following the procedure should be a light meal and then it is ok to progress to your normal diet.  A half-sandwich or bowl of soup is an example of a good first meal.  Heavy or fried foods are harder to digest and may make you feel nauseous or bloated.  Likewise meals heavy in dairy and vegetables can cause extra gas to form and this can also increase the bloating.  Drink plenty of fluids but you should avoid alcoholic beverages for 24 hours.  ACTIVITY: Your care partner should take you home directly after the procedure.  You should plan to take it easy, moving slowly for the rest of the day.  You can resume normal activity the day after the procedure however you should NOT DRIVE or use heavy  machinery for 24 hours (because of the sedation medicines used during the test).    SYMPTOMS TO REPORT IMMEDIATELY: A gastroenterologist can be reached at any hour.  During normal business hours, 8:30 AM to 5:00 PM Monday through Friday, call [phone].  After hours and on weekends, please call the GI answering service at [phone] who will take a message and have the physician on call contact you.     Following upper endoscopy (EGD)  Vomiting of blood or coffee ground material  New chest pain or pain under the shoulder blades  Painful or persistently difficult swallowing  New shortness of breath  Fever of 100F or higher  Black, tarry-looking stools  FOLLOW UP: If any biopsies were taken you will be contacted by phone or by letter within the next 1-3 weeks.  Call your gastroenterologist if you have not heard about the biopsies in 3 weeks.  Our staff will call the home number listed on your records the next business day following your procedure to check on you and address any questions or concerns that you may have at that time regarding the information given to you following your procedure. This is a courtesy call and so if there is no answer at the home number and we have not heard from you through the emergency physician on call, we will assume that you have returned to your regular daily activities without  incident.  SIGNATURES/CONFIDENTIALITY: You and/or your care partner have signed paperwork which will be entered into your electronic medical record.  These signatures attest to the fact that that the information above on your After Visit Summary has been reviewed and is understood.  Full responsibility of the confidentiality of this discharge information lies with you and/or your care-partner.

## 2012-06-15 NOTE — Progress Notes (Signed)
No complaints noted in the recovery room. Maw  Patient did not experience any of the following events: a burn prior to discharge; a fall within the facility; wrong site/side/patient/procedure/implant event; or a hospital transfer or hospital admission upon discharge from the facility. (G8907) Patient did not have preoperative order for IV antibiotic SSI prophylaxis. (G8918)  

## 2012-06-15 NOTE — Progress Notes (Signed)
Report to pacu rn, vss, bbs=clear 

## 2012-06-18 ENCOUNTER — Telehealth: Payer: Self-pay

## 2012-06-18 NOTE — Telephone Encounter (Signed)
  Follow up Call-  Call back number 06/15/2012  Post procedure Call Back phone  # [phone]  Permission to leave phone message Yes     Patient questions:  Do you have a fever, pain , or abdominal swelling? no Pain Score  0 *  Have you tolerated food without any problems? yes  Have you been able to return to your normal activities? yes  Do you have any questions about your discharge instructions: Diet   no Medications  no Follow up visit  no  Do you have questions or concerns about your Care? no  Actions: * If pain score is 4 or above: No action needed, pain <4.

## 2012-06-19 ENCOUNTER — Ambulatory Visit (INDEPENDENT_AMBULATORY_CARE_PROVIDER_SITE_OTHER): Payer: 59 | Admitting: Gynecology

## 2012-06-19 ENCOUNTER — Encounter: Payer: Self-pay | Admitting: Gynecology

## 2012-06-19 DIAGNOSIS — N611 Abscess of the breast and nipple: Secondary | ICD-10-CM

## 2012-06-19 MED ORDER — DOXYCYCLINE HYCLATE 50 MG PO CAPS: 50.0000 mg | ORAL_CAPSULE | Freq: Two times a day (BID) | ORAL | Status: DC

## 2012-06-19 MED ORDER — OXYCODONE-ACETAMINOPHEN 5-325 MG PO TABS: 1.0000 | ORAL_TABLET | ORAL | Status: DC | PRN

## 2012-06-19 NOTE — Patient Instructions (Signed)
Take antibiotics twice daily. Apply heat to your breast. Followup in one week. Call if symptoms worsen.

## 2012-06-19 NOTE — Addendum Note (Signed)
Addended by: Dayna Barker on: 06/19/2012 12:39 PM   Modules accepted: Orders

## 2012-06-19 NOTE — Progress Notes (Signed)
Patient ID: [name], female   DOB: January 29, 1966, 46 y.o.   MRN: [id number] Patient presents with a several day to one week history of left breast discomfort now with tender mass. She notices as happened previously usually comes and lasts several days each month but then resolves. The mass has never been as large as it is now and very tender. No nipple discharge axillary adenopathy fevers sweats or other symptoms.  Exam with Selena Batten assistant Both breasts examined lying and sitting. 1. Right without masses retractions discharge adenopathy. 2. Left with tender 2-3 cm tender cystic mass under areola with overlying skin erythema. No nipple discharge other masses retractions or axillary adenopathy.   Physical Exam  Pulmonary/Chest:      Procedure: Area overlying the mass was cleansed with alcohol, infiltrated with 1% lidocaine and aspiration of 3 cc purulent type material. Culture taken. The area still had small masslike effect after drainage.  Assessment and plan: Left breast abscess. Questionable cyst or prolactinoma that became infected. Culture taken. Will start on Vibramycin 100 mg twice a day, heat and warm soaks to the area with repeat exam in one week. Oxycodone 5/235 #20 one by mouth every 6 hour when necessary pain provided. Followup ASAP if symptoms worsen. Discussed various possibilities with the patient to include carcinoma. Recent mammogram December 2013 normal. We'll consider followup studies to include diagnostic mammogram ultrasound after acute inflammation subsides. The need for followup examination clearly understood.

## 2012-06-22 LAB — CULTURE, ROUTINE-ABSCESS: Gram Stain: NONE SEEN

## 2012-06-26 ENCOUNTER — Encounter: Payer: Self-pay | Admitting: Gynecology

## 2012-06-26 ENCOUNTER — Ambulatory Visit (INDEPENDENT_AMBULATORY_CARE_PROVIDER_SITE_OTHER): Payer: 59 | Admitting: Gynecology

## 2012-06-26 DIAGNOSIS — N611 Abscess of the breast and nipple: Secondary | ICD-10-CM

## 2012-06-26 DIAGNOSIS — H8309 Labyrinthitis, unspecified ear: Secondary | ICD-10-CM

## 2012-06-26 DIAGNOSIS — H8301 Labyrinthitis, right ear: Secondary | ICD-10-CM

## 2012-06-26 MED ORDER — TRAMADOL HCL 50 MG PO TABS: 50.0000 mg | ORAL_TABLET | Freq: Four times a day (QID) | ORAL | Status: DC | PRN

## 2012-06-26 MED ORDER — CEPHALEXIN 500 MG PO CAPS: 500.0000 mg | ORAL_CAPSULE | Freq: Four times a day (QID) | ORAL | Status: DC

## 2012-06-26 NOTE — Patient Instructions (Addendum)
Start on Keflex antibiotics 4 times daily x1 week. Office will contact you to arrange ultrasound of the breasts. Office will arrange appointment with Gen. surgery to consider draining or excising the area. Followup with Dr. Milinda Antis if your dizziness continues.

## 2012-06-26 NOTE — Progress Notes (Signed)
Patient ID: [name], female   DOB: 04/26/1965, 47 y.o.   MRN: [id number] Patient presents in followup with 2 issues: 1. Recent breast abscess aspiration. She was started on antibiotics Vibramycin 100 mg twice a day x1 week and heat.  Culture showed rare staph. Patient is feeling better but still persistence of the mass. 2. Right ear fullness feeling with dizziness with position changes. She moves her head quickly she'll notice some dizziness. No real earache, URI symptoms or other neurologic symptoms.  Exam with Selena Batten assistant  HEENT normal to include ear exam with normal canal and tympanic membranes bilaterally. No evidence of infection or fluid. No cervical adenopathy or evidence of pharyngitis.   Both breasts examined lying and sitting. Right without masses retractions discharge adenopathy. Left with 2 cm firm mass 1 to 3:00 position underlying areola. No overlying cellulitis or nipple drainage. No axillary adenopathy.  Physical Exam  Pulmonary/Chest:     Assessment and plan:  1. Left breast abscess. Less inflammatory today. We'll continue antibiotics with Keflex 500 mg 4 times a day. Continue heat. Schedule ultrasound for better definition and refer to general surgery for consideration of I&D/possible excision. 2. Vestibulitis causing dizziness. Recommended observation at present. If continues or worsens followup with her primary physician Dr. Milinda Antis for further evaluation and possible treatment.

## 2012-06-27 ENCOUNTER — Telehealth: Payer: Self-pay | Admitting: *Deleted

## 2012-06-27 DIAGNOSIS — N611 Abscess of the breast and nipple: Secondary | ICD-10-CM

## 2012-06-27 DIAGNOSIS — N6320 Unspecified lump in the left breast, unspecified quadrant: Secondary | ICD-10-CM

## 2012-06-27 NOTE — Telephone Encounter (Signed)
Pt informed with the below note. 

## 2012-06-27 NOTE — Telephone Encounter (Signed)
Pt was seen on 06/19/12 c/o  Left breast abscess had culture done , pt is calling requesting culture results. Please advise

## 2012-06-27 NOTE — Telephone Encounter (Signed)
Culture showed some staph bacteria which is a common skin bacteria, nothing bad

## 2012-06-27 NOTE — Telephone Encounter (Signed)
Per [facility] staff message pt will need:  #1 schedule ultrasound at breast center reference left breast mass suspect abscess currently treating with antibiotics  #2 schedule appointment with general surgeon following #1 reference left breast abscess currently on antibiotics to consider I&D  Order place at breast center, left message for [facility] to call to give me appointment time & date.

## 2012-06-27 NOTE — Telephone Encounter (Signed)
Elane Fritz called back and pt is scheduled on 07/03/12 @ 1:30 with Dr. Derrell Lolling at [facility] pt was informed by [place] as well with time and date.

## 2012-06-28 ENCOUNTER — Other Ambulatory Visit: Payer: Self-pay | Admitting: *Deleted

## 2012-06-28 DIAGNOSIS — N6320 Unspecified lump in the left breast, unspecified quadrant: Secondary | ICD-10-CM

## 2012-07-03 ENCOUNTER — Ambulatory Visit (INDEPENDENT_AMBULATORY_CARE_PROVIDER_SITE_OTHER): Payer: Self-pay | Admitting: General Surgery

## 2012-07-05 ENCOUNTER — Ambulatory Visit (INDEPENDENT_AMBULATORY_CARE_PROVIDER_SITE_OTHER): Payer: 59 | Admitting: General Surgery

## 2012-07-05 ENCOUNTER — Encounter (INDEPENDENT_AMBULATORY_CARE_PROVIDER_SITE_OTHER): Payer: Self-pay | Admitting: General Surgery

## 2012-07-05 VITALS — BP 114/76 | HR 74 | Temp 98.2°F | Resp 16 | Ht 62.5 in | Wt 152.0 lb

## 2012-07-05 DIAGNOSIS — N63 Unspecified lump in unspecified breast: Secondary | ICD-10-CM

## 2012-07-05 DIAGNOSIS — N6320 Unspecified lump in the left breast, unspecified quadrant: Secondary | ICD-10-CM | POA: Insufficient documentation

## 2012-07-05 NOTE — Patient Instructions (Signed)
Get your breast mammogram and ultrasound as scheduled I will contact you to discuss the results and the next steps

## 2012-07-05 NOTE — Progress Notes (Signed)
Patient ID: [name], female   DOB: [birth year], 47 y.o.   MRN: [id number]  Chief Complaint  Patient presents with  . Cyst    Left breast abscess    HPI [name] is a 47 y.o. female.   HPI 47 yo AAF referred by [name] for evaluation of Left breast cyst/mass. The patient states that the area has been present for about a year and a half. It is always in the same location around her nipple at around the 1:00 position. She states that it'll swell about once a month and become inflamed and tender. She states that it'll generally occur around the time of her menstrual period. It is never drained anything spontaneously. It will stay swollen and tender for about a week but eventually subside. A few weeks ago the area became very swollen, tender and reddened. It was more painful than usual so she went to see her OB/GYN. A needle aspiration was done in 3 cc of blood-tinged fluid was removed. He came back as mild staph. She was placed on oral antibiotics. The patient had a normal mammogram in December 2013. She is currently scheduled to have a unilateral mammogram and ultrasound next week. She states that it is still tender but not as severe as it was the other week.  Menarche was at age 76. Age of first pregnancy was 47 years of age. She is a G3 P3. She has had a laparoscopic vaginal hysterectomy for heavy cramping and bleeding. She has had a subsequent unilateral nephrectomy. She had a lumpectomy of her right breast at age 49. 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 Her maternal aunt had breast cancer diagnosed in her 24s. A brother has prostate cancer. She denies any other malignancy in her family. Past Medical History  Diagnosis Date  . Anemia     iron deficiency anemia  . Sickle cell trait   . Allergy     allergic rhinitis  . Personal history of kidney stones   . PPD positive     Hx of  . HPV in female     also some abnormal paps in past  . GERD (gastroesophageal reflux disease)   .  Hiatal hernia   . Renal lithiasis 2013  . Complication of anesthesia     slow to awaken    Past Surgical History  Procedure Laterality Date  . Breast surgery      removal of L breast mass / benign /age [age]  . Cesarean section      x3  . Tubal ligation      BTL  . Tonsillectomy and adenoidectomy      age 51  . Pelvic laparoscopy      x2 for cysts r/o endometriosis  . Laparoscopic vaginal hysterectomy  12/02/2010    menorrhagia, leiomyomata  . Cystoscopy  2008    L RPG, stone extraction ([place])  . Foot surgery  JULY 2013    RIGHT  . Appendectomy      1990's  . Laparoscopy N/A 05/24/2012    Procedure: LAPAROSCOPY OPERATIVE;  Surgeon: Dara Lords, MD;  Location: [facility];  Service: Gynecology;  Laterality: N/A;  . Salpingoophorectomy Right 05/24/2012    Procedure: SALPINGO OOPHORECTOMY;  Surgeon: Dara Lords, MD;  Location: [facility];  Service: Gynecology;  Laterality: Right;  . Abdominal hysterectomy      Family History  Problem Relation Age of Onset  . Cancer Maternal Aunt 54    breast CA  .  Cancer Paternal Aunt     uterine CA  . Lupus Cousin   . Anesthesia problems Mother   . Hypertension Mother   . Aneurysm Father   . Colon cancer Neg Hx   . Esophageal cancer Neg Hx   . Stomach cancer Neg Hx   . Rectal cancer Neg Hx     Social History History  Substance Use Topics  . Smoking status: Never Smoker   . Smokeless tobacco: Never Used  . Alcohol Use: No    Allergies  Allergen Reactions  . Morphine     REACTION: itch    Current Outpatient Prescriptions  Medication Sig Dispense Refill  . cephALEXin (KEFLEX) 500 MG capsule Take 1 capsule (500 mg total) by mouth 4 (four) times daily.  28 capsule  0  . ibuprofen (ADVIL,MOTRIN) 800 MG tablet Take 1 tablet (800 mg total) by mouth every 8 (eight) hours as needed for pain.  30 tablet  1  . Multiple Vitamin (MULTIVITAMIN WITH MINERALS) TABS Take 1 tablet by mouth daily.      Marland Kitchen omeprazole (PRILOSEC) 40 MG  capsule Take 1 capsule (40 mg total) by mouth daily.  30 capsule  5  . traMADol (ULTRAM) 50 MG tablet Take 1 tablet (50 mg total) by mouth every 6 (six) hours as needed for pain.  30 tablet  0  . [DISCONTINUED] albuterol (PROAIR HFA) 108 (90 BASE) MCG/ACT inhaler Inhale 2 puffs into the lungs every 4 (four) hours as needed.         No current facility-administered medications for this visit.    Review of Systems Review of Systems  Constitutional: Negative for fever, chills and unexpected weight change.  HENT: Negative for hearing loss, congestion, sore throat, trouble swallowing and voice change.   Eyes: Negative for visual disturbance.  Respiratory: Negative for cough and wheezing.   Cardiovascular: Negative for chest pain, palpitations and leg swelling.  Gastrointestinal: Negative for nausea, vomiting, abdominal pain, diarrhea, constipation, blood in stool, abdominal distention and anal bleeding.  Genitourinary: Negative for hematuria, vaginal bleeding and difficulty urinating.       Has had lap vag hysterectomy due to heavy cramping and bleeding. Has had 1 ovary removed about 1 month ago  Musculoskeletal: Negative for arthralgias.  Skin: Negative for rash and wound.  Neurological: Negative for seizures, syncope and headaches.  Hematological: Negative for adenopathy. Does not bruise/bleed easily.  Psychiatric/Behavioral: Negative for confusion.    Blood pressure 114/76, pulse 74, temperature 98.2 F (36.8 C), temperature source Temporal, resp. rate 16, height 5' 2.5" (1.588 m), weight 152 lb (68.947 kg), last menstrual period 11/24/2010.  Physical Exam Physical Exam  Vitals reviewed. Constitutional: She is oriented to person, place, and time. She appears well-developed and well-nourished. No distress.  HENT:  Head: Normocephalic and atraumatic.  Right Ear: External ear normal.  Eyes: Conjunctivae are normal. No scleral icterus.  Neck: Normal range of motion. Neck supple. No  tracheal deviation present. No thyromegaly present.  Cardiovascular: Normal rate, regular rhythm and normal heart sounds.   Pulmonary/Chest: Effort normal and breath sounds normal. No respiratory distress. She has no wheezes. She has no rales. Right breast exhibits no inverted nipple, no mass, no nipple discharge, no skin change and no tenderness. Left breast exhibits mass and tenderness. Left breast exhibits no nipple discharge and no skin change. Breasts are symmetrical.    Old well healed incision Rt breast 7 oclock about 5cm from NAC.  Left breast  - well circumscribed firm  mass at NAC from 1-3 o'clock; about 2 x 2 x 1.5cm. No overlying skin changes. No cellulitis. No fluctuance. L nipple is a little very subtlety has inversion. Mildly TTP over mass.    Abdominal: Soft. There is no tenderness.  Musculoskeletal: She exhibits no edema.  Lymphadenopathy:    She has no cervical adenopathy.    She has no axillary adenopathy.       Right: No supraclavicular adenopathy present.       Left: No supraclavicular adenopathy present.  Neurological: She is alert and oriented to person, place, and time. She exhibits normal muscle tone.  Skin: Skin is warm and dry. No rash noted. She is not diaphoretic. No erythema. No pallor.  Psychiatric: She has a normal mood and affect. Her behavior is normal. Judgment and thought content normal.    Data Reviewed Cindy Reynold Bowen 2 office notes Dec 2013 Mammogram report  Assessment    Left breast mass at 1-3 o'clock     Plan    I explained to the patient and her husband that I believe this is a benign process. However I cannot say with 100% confidence until we get a tissue diagnosis. We discussed potential differential diagnoses. My recommendation was to proceed with the mammogram and ultrasound that she has scheduled for next week. If there are suspicious findings on the ultrasound then I explained to her and her husband that she may need a core biopsy prior to  surgical excision. We briefly discussed with surgical excision would involve along with risk and benefits of surgery.  All of her questions were asked and answered. Her followup will be based on the results of her ultrasound and mammogram currently scheduled for April 9  Cindy Creps M. Andrey Campanile, MD, FACS General, Bariatric, & Minimally Invasive Surgery [facility], [place]        [facility] M 07/05/2012, 4:33 PM

## 2012-07-11 ENCOUNTER — Ambulatory Visit
Admission: RE | Admit: 2012-07-11 | Discharge: 2012-07-11 | Disposition: A | Discharge status: Home / Self Care | Payer: 59 | Source: Ambulatory Visit | Attending: Gynecology | Admitting: Gynecology

## 2012-07-11 DIAGNOSIS — N6320 Unspecified lump in the left breast, unspecified quadrant: Secondary | ICD-10-CM

## 2012-07-12 ENCOUNTER — Ambulatory Visit (INDEPENDENT_AMBULATORY_CARE_PROVIDER_SITE_OTHER): Payer: 59 | Admitting: Surgery

## 2012-07-12 ENCOUNTER — Encounter (INDEPENDENT_AMBULATORY_CARE_PROVIDER_SITE_OTHER): Payer: Self-pay | Admitting: Surgery

## 2012-07-12 VITALS — BP 122/78 | HR 80 | Temp 97.7°F | Resp 12 | Ht 63.0 in | Wt 149.6 lb

## 2012-07-12 DIAGNOSIS — N63 Unspecified lump in unspecified breast: Secondary | ICD-10-CM

## 2012-07-12 DIAGNOSIS — N6320 Unspecified lump in the left breast, unspecified quadrant: Secondary | ICD-10-CM

## 2012-07-12 NOTE — Progress Notes (Signed)
[facility]  Ovidio Kin, MD,  FACS [address]  [address] [place], [place]    [address] Phone:  [phone] FAX:  [phone]   Re:   [name] DOB:   [birth year] MRN:   [id number]  Urgent Office  ASSESSMENT AND PLAN: 1.  Left breast mass - 3 o'clock  Probable obstructed sub areolar duct.  Today, I do not think that it is an abscess that needs drainage.  Plan excision of mass/duct.  I discussed the indications and complications of surgery.  The risks include, but are not limited to, infection, bleeding, recurrence of the mass, and nerve injury.  I gave her a book on breast biopsies.  I gave her prescription of Doxycycline in case this area flairs up prior to surgery.  2.  History of GERD 3.  Nephrolithiasis 4.  Had right oophorectomy by Dr. Audie Box in 05/24/2012.  HISTORY OF PRESENT ILLNESS: No chief complaint on file.  [name] is a 47 y.o. (DOB: [birth year])  AA  female who is a patient of Roxy Manns, MD and comes to urgent office today for a left breast mass.  The patient saw Dr. Gaynelle Adu on 07/05/2012 for this left breast mass. The patient said the mass had been present for about a year and a half. It is at the lateral aspect of the left nipple and swells about once a month. She previosly had a negative mammogram in December 2013.  She's been placed on antibiotics by Dr. Chiquita Loth which is helped the discomfort, but the mass remains.  She had a repeat mammogram and ultrasound on 07/11/2012 shows a 2.2 x 2.0 cm mass in the left subareolar area that was consistent with an abscess. The patient has never had this area excised.  She comes today for further discussion and decisions about what to do about this mass.  Menarche was at age 47. Age of first pregnancy was 47 years of age. She is a G3 P3. She has had a laparoscopic vaginal hysterectomy for heavy cramping and bleeding.  She had a lumpectomy of her right breast at age 75. She believes this  was for fibroadenoma. Her maternal aunt had breast cancer diagnosed in her 43s. A brother has prostate cancer.  Past Medical History  Diagnosis Date  . Anemia     iron deficiency anemia  . Sickle cell trait   . Allergy     allergic rhinitis  . Personal history of kidney stones   . PPD positive     Hx of  . HPV in female     also some abnormal paps in past  . GERD (gastroesophageal reflux disease)   . Hiatal hernia   . Renal lithiasis 2013  . Complication of anesthesia     slow to awaken   Current Outpatient Prescriptions  Medication Sig Dispense Refill  . ibuprofen (ADVIL,MOTRIN) 800 MG tablet Take 1 tablet (800 mg total) by mouth every 8 (eight) hours as needed for pain.  30 tablet  1  . Multiple Vitamin (MULTIVITAMIN WITH MINERALS) TABS Take 1 tablet by mouth daily.      Marland Kitchen omeprazole (PRILOSEC) 40 MG capsule Take 1 capsule (40 mg total) by mouth daily.  30 capsule  5  . traMADol (ULTRAM) 50 MG tablet Take 1 tablet (50 mg total) by mouth every 6 (six) hours as needed for pain.  30 tablet  0  . [DISCONTINUED] albuterol (PROAIR HFA) 108 (90 BASE) MCG/ACT inhaler  Inhale 2 puffs into the lungs every 4 (four) hours as needed.         No current facility-administered medications for this visit.   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  PHYSICAL EXAM: BP 122/78  Pulse 80  Temp(Src) 97.7 F (36.5 C)  Resp 12  Ht 5\' 3"  (1.6 m)  Wt 149 lb 9.6 oz (67.858 kg)  BMI 26.51 kg/m2  LMP 11/24/2010  HEENT:  Pupils equal.  Dentition good. NECK:  Supple.  No thyroid mass. LYMPH NODES:  No cervical, supraclavicular, or axillary adenopathy. BREASTS -  RIGHT:  No palpable mass or nodule.  No nipple discharge.  Scar at 7 o'clock   LEFT:  2 x 3 cm mildly tender mass at 3 o'clock in the subareolar area.  At this time, I do not thing this is an abscess that needs drainage.  I cannot express nipple discharge. UPPER EXTREMITIES:  No evidence of lymphedema. Lungs:   Clear Heart:  RRR  DATA REVIEWED: Notes in Epic  Ovidio Kin, MD, FACS Office:  [phone]

## 2012-07-24 ENCOUNTER — Telehealth (INDEPENDENT_AMBULATORY_CARE_PROVIDER_SITE_OTHER): Payer: Self-pay | Admitting: General Surgery

## 2012-07-24 NOTE — Telephone Encounter (Signed)
Pt called to request pain medicine for breast inflammation; she is out of Ultram.  Paged and updated Dr. Ezzard Standing, who OKd refill of her Ultram.  Called Ultram (generic) 50 mg, # 30, 1 po Q6H prn pain, no refill called to [facility]:  [phone].  Pt is aware.

## 2012-07-26 ENCOUNTER — Other Ambulatory Visit: Payer: Self-pay | Admitting: Family Medicine

## 2012-07-26 MED ORDER — OMEPRAZOLE 40 MG PO CPDR: 40.0000 mg | DELAYED_RELEASE_CAPSULE | Freq: Every day | ORAL | Status: DC

## 2012-07-27 ENCOUNTER — Other Ambulatory Visit (INDEPENDENT_AMBULATORY_CARE_PROVIDER_SITE_OTHER): Payer: Self-pay | Admitting: Surgery

## 2012-07-27 DIAGNOSIS — N61 Mastitis without abscess: Secondary | ICD-10-CM

## 2012-07-30 ENCOUNTER — Telehealth (INDEPENDENT_AMBULATORY_CARE_PROVIDER_SITE_OTHER): Payer: Self-pay | Admitting: General Surgery

## 2012-07-30 NOTE — Telephone Encounter (Signed)
Spoke with patient she is aware of appt 08/14/12  She also ask about rash on lt arm where she had tape I advised a cold compress and if it gets worse to call the office

## 2012-08-08 ENCOUNTER — Telehealth: Payer: Self-pay | Admitting: Family Medicine

## 2012-08-08 DIAGNOSIS — Z0000 Encounter for general adult medical examination without abnormal findings: Secondary | ICD-10-CM

## 2012-08-08 NOTE — Telephone Encounter (Signed)
Message copied by Judy Pimple on Wed Aug 08, 2012 10:03 PM ------      Message from: Baldomero Lamy      Created: Thu Jul 26, 2012 10:13 AM      Regarding: Cpx labs 5/8 Thurs       Please order  future cpx labs for pt's upcoming lab appt.      Thanks      Tasha       ------

## 2012-08-09 ENCOUNTER — Other Ambulatory Visit: Payer: 59

## 2012-08-10 ENCOUNTER — Telehealth (INDEPENDENT_AMBULATORY_CARE_PROVIDER_SITE_OTHER): Payer: Self-pay | Admitting: *Deleted

## 2012-08-10 NOTE — Telephone Encounter (Signed)
Patient called to state she believes she has a stitch coming through the skin.  Patient denies any redness, drainage or fevers at this time.  Patient states she is going to try a bandaid over the area to protect it from getting pulled and Ibuprofen for pain then wait to come in on Tuesday for her PO appt.  Offered patient urgent office however she feels that she can wait until next week.

## 2012-08-11 ENCOUNTER — Emergency Department: Payer: Self-pay | Admitting: Emergency Medicine

## 2012-08-14 ENCOUNTER — Ambulatory Visit (INDEPENDENT_AMBULATORY_CARE_PROVIDER_SITE_OTHER): Payer: 59 | Admitting: Surgery

## 2012-08-14 ENCOUNTER — Encounter (INDEPENDENT_AMBULATORY_CARE_PROVIDER_SITE_OTHER): Payer: Self-pay | Admitting: Surgery

## 2012-08-14 VITALS — BP 110/70 | HR 87 | Temp 98.2°F | Resp 18 | Ht 62.5 in | Wt 150.0 lb

## 2012-08-14 DIAGNOSIS — N6320 Unspecified lump in the left breast, unspecified quadrant: Secondary | ICD-10-CM

## 2012-08-14 DIAGNOSIS — N63 Unspecified lump in unspecified breast: Secondary | ICD-10-CM

## 2012-08-14 NOTE — Progress Notes (Signed)
[facility]  Ovidio Kin, MD,  FACS [address] [place]  [address] [place], [place]    [address] Phone:  [phone] FAX:  [phone]   Re:   [name] DOB:   16-Nov-1965 MRN:   [id number]  Urgent Office  ASSESSMENT AND PLAN: 1.  Left breast mass - 3 o'clock  Left breast excision - [facility] - 07/27/2012  Path - inflammation/abscess - copy to patient  Return PRN   2.  History of GERD 3.  Nephrolithiasis 4.  Had right oophorectomy by Dr. Audie Box in 05/24/2012.  HISTORY OF PRESENT ILLNESS: Chief Complaint  Patient presents with  . Routine Post Op    Lt Br   [name] is a 47 y.o. (DOB: 04/20/1965)  AA  female who is a patient of Roxy Manns, MD and comes follow up of left breast mass excision.  Doing well.  A little sore.  No concerns.  Past Breast History: The patient saw Dr. Gaynelle Adu on 07/05/2012 for this left breast mass. The patient said the mass had been present for about a year and a half. It is at the lateral aspect of the left nipple and swells about once a month. She previosly had a negative mammogram in December 2013.  She's been placed on antibiotics by Dr. Chiquita Loth which is helped the discomfort, but the mass remains.  She had a repeat mammogram and ultrasound on 07/11/2012 shows a 2.2 x 2.0 cm mass in the left subareolar area that was consistent with an abscess. The patient has never had this area excised.  She comes today for further discussion and decisions about what to do about this mass.  Menarche was at age 59. Age of first pregnancy was 47 years of age. She is a G3 P3. She has had a laparoscopic vaginal hysterectomy for heavy cramping and bleeding.  She had a lumpectomy of her right breast at age 8. 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 Her maternal aunt had breast cancer diagnosed in her 57s. A brother has prostate cancer.  Past Medical History  Diagnosis Date  . Anemia     iron deficiency anemia  . Sickle cell trait    . Allergy     allergic rhinitis  . Personal history of kidney stones   . PPD positive     Hx of  . HPV in female     also some abnormal paps in past  . GERD (gastroesophageal reflux disease)   . Hiatal hernia   . Renal lithiasis 2013  . Complication of anesthesia     slow to awaken   Current Outpatient Prescriptions  Medication Sig Dispense Refill  . ibuprofen (ADVIL,MOTRIN) 800 MG tablet Take 1 tablet (800 mg total) by mouth every 8 (eight) hours as needed for pain.  30 tablet  1  . Multiple Vitamin (MULTIVITAMIN WITH MINERALS) TABS Take 1 tablet by mouth daily.      Marland Kitchen omeprazole (PRILOSEC) 40 MG capsule Take 1 capsule (40 mg total) by mouth daily.  90 capsule  0  . [DISCONTINUED] albuterol (PROAIR HFA) 108 (90 BASE) MCG/ACT inhaler Inhale 2 puffs into the lungs every 4 (four) hours as needed.         No current facility-administered medications for this visit.   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  PHYSICAL EXAM: BP 110/70  Pulse 87  Temp(Src) 98.2 F (36.8 C) (Temporal)  Resp 18  Ht 5'  2.5" (1.588 m)  Wt 150 lb (68.04 kg)  BMI 26.98 kg/m2  LMP 11/24/2010  HEENT:  Pupils equal.  Dentition good. NECK:  Supple.  No thyroid mass. BREASTS -  RIGHT:  No palpable mass or nodule.  No nipple discharge.  Scar at 7 o'clock   LEFT:  Incision at 3 o'clock has healed well.  Still a little sore.  DATA REVIEWED: Path report to patient.  Ovidio Kin, MD, FACS Office:  [phone]

## 2012-08-15 ENCOUNTER — Ambulatory Visit (INDEPENDENT_AMBULATORY_CARE_PROVIDER_SITE_OTHER): Payer: 59 | Admitting: Family Medicine

## 2012-08-15 ENCOUNTER — Encounter: Payer: 59 | Admitting: Family Medicine

## 2012-08-15 ENCOUNTER — Encounter: Payer: Self-pay | Admitting: Family Medicine

## 2012-08-15 VITALS — BP 110/70 | HR 80 | Temp 98.0°F | Ht 62.5 in | Wt 149.5 lb

## 2012-08-15 DIAGNOSIS — S060X0A Concussion without loss of consciousness, initial encounter: Secondary | ICD-10-CM

## 2012-08-15 DIAGNOSIS — S060X9A Concussion with loss of consciousness of unspecified duration, initial encounter: Secondary | ICD-10-CM

## 2012-08-15 DIAGNOSIS — S060XAA Concussion with loss of consciousness status unknown, initial encounter: Secondary | ICD-10-CM | POA: Insufficient documentation

## 2012-08-15 DIAGNOSIS — M549 Dorsalgia, unspecified: Secondary | ICD-10-CM

## 2012-08-15 MED ORDER — DIAZEPAM 2 MG PO TABS: 2.0000 mg | ORAL_TABLET | Freq: Three times a day (TID) | ORAL | Status: DC | PRN

## 2012-08-15 NOTE — Patient Instructions (Signed)
F/u Dr. Milinda Antis in 1 week
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
## 2012-08-17 ENCOUNTER — Telehealth: Payer: Self-pay
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
## 2012-08-17 NOTE — Telephone Encounter (Signed)
We generally do not refer to chiropractors- patients just make their own appts--- does she have someone in mind she wants to see?

## 2012-08-20 ENCOUNTER — Telehealth: Payer: Self-pay | Admitting: Family Medicine

## 2012-08-20 NOTE — Telephone Encounter (Signed)
Pt said that she didn't know if she needed to go to a chiropractor or go to PT, pt said that Dr. Patsy Lager recommended PT so she may need referral for that, pt said that she hasn't went back to work because she is still in a lot of pain an her HA are getting worse, pt has tried to take ibuprofen but it's not helping the pain, pt was prescribed hydrocodone/ oxycodone but that was to strong for her, pt would like a pain med that is stronger then the ibuprofen but not as strong as the hydrocodone because she wants to go to work. Pt also said that Dr. Patsy Lager told her that we could fill out FMLA papers for her if we needed to but pt is really trying to go back to work and would want to know how long do you think she would be out of work

## 2012-08-20 NOTE — Telephone Encounter (Signed)
Let her know I spoke to Dr Patsy Lager and I am concerned about the worsening headache - please put her on my schedule tomorrow - (4:15 is ok if that is only avail) - I want to examine her and we may discuss an imaging study if necessary and disc work leave as well

## 2012-08-20 NOTE — Telephone Encounter (Signed)
Pt notified of Dr. Milinda Antis recommendations and f/u appt scheduled for tomorrow

## 2012-08-20 NOTE — Telephone Encounter (Signed)
Pt notified of Dr. Milinda Antis and Dr. Cyndie Chime recommendations and f/u appt with Dr. Milinda Antis scheduled for tomorrow

## 2012-08-20 NOTE — Telephone Encounter (Signed)
Discussed case with Dr. Milinda Antis. She definitely does not need to work, drive, needs rest.  See my prior note.  Advise against PT or chiropractor at this point.   Hannah Beat, MD 08/20/2012, 1:40 PM

## 2012-08-20 NOTE — Telephone Encounter (Signed)
See my response (separate phone note)- to get her on my schedule tomorrow -thanks

## 2012-08-21 ENCOUNTER — Encounter: Payer: Self-pay | Admitting: Family Medicine

## 2012-08-21 ENCOUNTER — Ambulatory Visit (INDEPENDENT_AMBULATORY_CARE_PROVIDER_SITE_OTHER): Payer: 59 | Admitting: Family Medicine

## 2012-08-21 VITALS — BP 116/80 | HR 87 | Temp 98.8°F | Ht 62.5 in | Wt 153.0 lb

## 2012-08-21 DIAGNOSIS — R51 Headache: Secondary | ICD-10-CM

## 2012-08-21 DIAGNOSIS — S060X0D Concussion without loss of consciousness, subsequent encounter: Secondary | ICD-10-CM

## 2012-08-21 DIAGNOSIS — Z5189 Encounter for other specified aftercare: Secondary | ICD-10-CM

## 2012-08-21 MED ORDER — TRAMADOL HCL 50 MG PO TABS: 50.0000 mg | ORAL_TABLET | Freq: Three times a day (TID) | ORAL | Status: DC | PRN

## 2012-08-21 NOTE — Assessment & Plan Note (Signed)
Dizziness is improving - valium works well when she needs it

## 2012-08-21 NOTE — Assessment & Plan Note (Signed)
Symptoms continue -now with worse headache and occ n/v Sent for CT of head  Reassuring exam  Voiced impt of mental and physical rest Continue out of work F/u here 1 week or earlier if needed

## 2012-08-21 NOTE — Assessment & Plan Note (Signed)
Worsening after concussion  Sent for CT  Also tramadol for pain to try  On ibuprofen-stressed no more than 1 pill every 8 hours with food

## 2012-08-21 NOTE — Patient Instructions (Addendum)
We will refer you for a CT scan at check out [name]) Try tramadol for headache - I sent that to the pharmacy  Use ibuprofen no more than every 8 hours and take it with food  Follow up in 1 week with me  Continue brain and body rest- strictly  If you develop a severe headache-please go to ER if after hours
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
## 2012-08-22 ENCOUNTER — Ambulatory Visit: Payer: 59 | Admitting: Family Medicine

## 2012-08-22 ENCOUNTER — Ambulatory Visit (INDEPENDENT_AMBULATORY_CARE_PROVIDER_SITE_OTHER)
Admission: RE | Admit: 2012-08-22 | Discharge: 2012-08-22 | Disposition: A | Discharge status: Home / Self Care | Payer: 59 | Source: Ambulatory Visit | Attending: Family Medicine | Admitting: Family Medicine

## 2012-08-22 DIAGNOSIS — S060X0D Concussion without loss of consciousness, subsequent encounter: Secondary | ICD-10-CM

## 2012-08-22 DIAGNOSIS — R51 Headache: Secondary | ICD-10-CM

## 2012-08-22 DIAGNOSIS — Z5189 Encounter for other specified aftercare: Secondary | ICD-10-CM

## 2012-08-24 ENCOUNTER — Telehealth: Payer: Self-pay | Admitting: Family Medicine

## 2012-08-24 NOTE — Telephone Encounter (Signed)
On Dr. Royden Purl desk

## 2012-08-24 NOTE — Telephone Encounter (Signed)
Frieda, [place] dropped off FMLA papers for Dr. Milinda Antis to complete and fax to [phone]. Please include the page that states 6 of 14 with the document for the reason of the case number is listed on the top of the page. Also, please call [place] at [phone] once it has been faxed.  Thank you, Revonda Standard

## 2012-08-24 NOTE — Telephone Encounter (Signed)
Patient notified that completed form has been faxed per her request.

## 2012-08-24 NOTE — Telephone Encounter (Signed)
Done and in IN box 

## 2012-09-03 ENCOUNTER — Telehealth: Payer: Self-pay | Admitting: Family Medicine

## 2012-09-03 NOTE — Telephone Encounter (Signed)
Thanks for the heads up.

## 2012-09-03 NOTE — Telephone Encounter (Signed)
FYI: they have switch pt's FMLA to short term disability due to the amount of time she needs off so she is calling to give [place] a heads up that they may be calling or faxing form for additional information

## 2012-09-04 ENCOUNTER — Encounter: Payer: Self-pay | Admitting: Radiology

## 2012-09-05 ENCOUNTER — Ambulatory Visit (INDEPENDENT_AMBULATORY_CARE_PROVIDER_SITE_OTHER): Payer: 59 | Admitting: Family Medicine

## 2012-09-05 ENCOUNTER — Encounter: Payer: Self-pay | Admitting: Family Medicine

## 2012-09-05 VITALS — BP 110/78 | HR 88 | Temp 98.7°F | Ht 62.5 in | Wt 153.5 lb

## 2012-09-05 DIAGNOSIS — Z5189 Encounter for other specified aftercare: Secondary | ICD-10-CM

## 2012-09-05 DIAGNOSIS — S060X0D Concussion without loss of consciousness, subsequent encounter: Secondary | ICD-10-CM

## 2012-09-05 DIAGNOSIS — R51 Headache: Secondary | ICD-10-CM

## 2012-09-05 MED ORDER — DIAZEPAM 2 MG PO TABS: 2.0000 mg | ORAL_TABLET | Freq: Three times a day (TID) | ORAL | Status: DC | PRN

## 2012-09-05 MED ORDER — TRAMADOL HCL 50 MG PO TABS: 50.0000 mg | ORAL_TABLET | Freq: Three times a day (TID) | ORAL | Status: DC | PRN
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
## 2012-09-05 NOTE — Patient Instructions (Addendum)
We will refer you to neurology at check out  I am keeping you out of work tentatively until 6/20 depending on how you feel  Still try to rest- brain and body  Medicines refilled- use as needed

## 2012-09-06 ENCOUNTER — Encounter: Payer: Self-pay | Admitting: Family Medicine

## 2012-09-18 ENCOUNTER — Encounter: Payer: Self-pay | Admitting: Diagnostic Neuroimaging

## 2012-09-18 ENCOUNTER — Encounter: Payer: Self-pay | Admitting: Family Medicine

## 2012-09-18 ENCOUNTER — Ambulatory Visit (INDEPENDENT_AMBULATORY_CARE_PROVIDER_SITE_OTHER): Payer: 59 | Admitting: Diagnostic Neuroimaging

## 2012-09-18 VITALS — BP 108/65 | HR 75 | Temp 97.7°F | Ht 62.5 in | Wt 153.0 lb

## 2012-09-18 DIAGNOSIS — F0781 Postconcussional syndrome: Secondary | ICD-10-CM

## 2012-09-18 NOTE — Patient Instructions (Signed)
Post-Concussion Syndrome Post-concussion syndrome describes the symptoms that can occur after a head injury. These symptoms can last from weeks to months.  TREATMENT  Usually, these problems disappear over time without medical care. Your caregiver may prescribe medicine to help ease your symptoms. It is important to follow up with a neurologist to evaluate your recovery and address any lingering symptoms or issues. HOME CARE INSTRUCTIONS   Only take over-the-counter or prescription medicines for pain, discomfort, or fever as directed by your caregiver.   Sleep with your head slightly elevated to help with headaches.  Avoid any situation where there is potential for another head injury (football, hockey, martial arts, horseback riding). Your condition will get worse every time you experience a concussion. You should avoid these activities until you are evaluated by the appropriate follow-up caregivers.  Keep all follow-up appointments as directed by your caregiver.
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
## 2012-09-20 ENCOUNTER — Telehealth: Payer: Self-pay

## 2012-09-20 NOTE — Telephone Encounter (Signed)
Pt saw neurologist; h/a could last for weeks or months; pt needs PT referral by neurologist; pt cannot raise either arm and neck pain. Pt wants PT referral to [facility] on [address] in [place]. Pt wants request to go to Dr Patsy Lager since Dr Milinda Antis out of office for 1 week. Pt wants to know if dizziness will go away and should pt continue Diazepam. Pt said neurologist said dizziness could go on for weeks or months like h/a but pt wanted Dr Milinda Antis or Copland's opinion. Pt request cb.

## 2012-09-22 NOTE — Telephone Encounter (Signed)
Patient was just seen by Neurology. She had some questions, and Dr. Milinda Antis on vacation. I initially evaluated the patient. Continues to have some headache and dizziness.  She had question regarding PT and that sounds appropriate. She would like to go to [facility] on [address]. I discussed with her it really all depends on Dr. Richrd Humbles goals and what he would like PT to do. They do a good job for orthopedic rehab.  If his goal is neurorehab or vestibular rehab, then [facility] may be more appropriate. I told her I would send Dr. Marjory Lies this message, so he can decide.   She also wanted my opinion. I agree with neurology that in post-concussive syndrome headache and dizziness can last months.

## 2012-09-24 ENCOUNTER — Other Ambulatory Visit: Payer: Self-pay

## 2012-09-24 DIAGNOSIS — M549 Dorsalgia, unspecified: Secondary | ICD-10-CM

## 2012-09-24 MED ORDER — DIAZEPAM 2 MG PO TABS: 2.0000 mg | ORAL_TABLET | Freq: Three times a day (TID) | ORAL | Status: DC | PRN

## 2012-09-24 MED ORDER — TRAMADOL HCL 50 MG PO TABS: 50.0000 mg | ORAL_TABLET | Freq: Three times a day (TID) | ORAL | Status: DC | PRN

## 2012-09-24 MED ORDER — IBUPROFEN 800 MG PO TABS: 800.0000 mg | ORAL_TABLET | Freq: Three times a day (TID) | ORAL | Status: DC | PRN

## 2012-09-24 NOTE — Telephone Encounter (Signed)
Ok to refill valium #30, 0 ref Ok to refill ibuprofen 800 mg, tid, #60, 2 ref Ok to refill tramadol 50 mg, 1 po qid, #60, 2 ref   I am going to defer PT orders and specifics to her neurologist. I sent him a note as we discussed over the weekend.   Hannah Beat, MD 09/24/2012, 11:08 AM

## 2012-09-24 NOTE — Telephone Encounter (Signed)
Pt wants to know when is the soonest pt can start PT. Pt also request refill Valium,ibuprofen and tramadol to [facility].Please advise. Pt request cb.

## 2012-09-24 NOTE — Telephone Encounter (Signed)
Medication called to pharmacy and patient advised

## 2012-10-01 ENCOUNTER — Telehealth: Payer: Self-pay | Admitting: Diagnostic Neuroimaging

## 2012-10-01 DIAGNOSIS — F0781 Postconcussional syndrome: Secondary | ICD-10-CM

## 2012-10-11 NOTE — Telephone Encounter (Signed)
Order needed for PT per ofv note 09-18-12.  Done.

## 2012-10-12 ENCOUNTER — Ambulatory Visit (INDEPENDENT_AMBULATORY_CARE_PROVIDER_SITE_OTHER): Payer: 59 | Admitting: Gynecology

## 2012-10-12 ENCOUNTER — Encounter: Payer: Self-pay | Admitting: Gynecology

## 2012-10-12 DIAGNOSIS — N951 Menopausal and female climacteric states: Secondary | ICD-10-CM

## 2012-10-12 DIAGNOSIS — N898 Other specified noninflammatory disorders of vagina: Secondary | ICD-10-CM

## 2012-10-12 DIAGNOSIS — IMO0002 Reserved for concepts with insufficient information to code with codable children: Secondary | ICD-10-CM

## 2012-10-12 DIAGNOSIS — A499 Bacterial infection, unspecified: Secondary | ICD-10-CM

## 2012-10-12 DIAGNOSIS — B9689 Other specified bacterial agents as the cause of diseases classified elsewhere: Secondary | ICD-10-CM

## 2012-10-12 DIAGNOSIS — N760 Acute vaginitis: Secondary | ICD-10-CM

## 2012-10-12 LAB — WET PREP FOR TRICH, YEAST, CLUE: Trich, Wet Prep: NONE SEEN

## 2012-10-12 MED ORDER — TERCONAZOLE 0.8 % VA CREA: 1.0000 | TOPICAL_CREAM | Freq: Every day | VAGINAL | Status: DC

## 2012-10-12 MED ORDER — METRONIDAZOLE 500 MG PO TABS: 500.0000 mg | ORAL_TABLET | Freq: Two times a day (BID) | ORAL | Status: DC

## 2012-10-12 NOTE — Patient Instructions (Signed)
Use vaginal cream nightly for 3 nights. Take antibiotics twice daily for 7 days. Avoid alcohol while taking the antibiotics. Followup if her symptoms persist, worsen or recur.

## 2012-10-12 NOTE — Progress Notes (Signed)
Patient presents complaining of a white discharge with irritation. Also notes vaginal dryness with dyspareunia with an irritative discomfort. Patient also noted some hot flushes and night sweats.  Exam was [facility] Asst. Abdomen soft nontender without masses guarding rebound organomegaly. Pelvic external BUS vagina with thick white discharge. Bimanual without masses or tenderness  Assessment and plan: Exam, history and wet prep consistent with yeast. Also shows clue cells and bacteria. Will treat with Terazol 3 day cream and Flagyl 500 mg twice a day x7 days, alcohol avoidance reviewed. Check baseline TSH FSH 22 menopausal symptoms. I suspect that her irritative changes and pain with intercourse are secondary to her vaginal infections but will rule out ovarian failure and thyroid dysfunction.

## 2012-10-13 LAB — FOLLICLE STIMULATING HORMONE: FSH: 16 m[IU]/mL

## 2012-10-13 LAB — TSH: TSH: 1.057 u[IU]/mL (ref 0.350–4.500)

## 2012-10-17 ENCOUNTER — Ambulatory Visit: Payer: 59 | Attending: Diagnostic Neuroimaging | Admitting: Rehabilitative and Restorative Service Providers"

## 2012-10-17 DIAGNOSIS — M542 Cervicalgia: Secondary | ICD-10-CM | POA: Insufficient documentation

## 2012-10-17 DIAGNOSIS — F0781 Postconcussional syndrome: Secondary | ICD-10-CM | POA: Insufficient documentation

## 2012-10-17 DIAGNOSIS — IMO0001 Reserved for inherently not codable concepts without codable children: Secondary | ICD-10-CM | POA: Insufficient documentation

## 2012-10-17 DIAGNOSIS — R42 Dizziness and giddiness: Secondary | ICD-10-CM | POA: Insufficient documentation

## 2012-10-18 ENCOUNTER — Other Ambulatory Visit: Payer: Self-pay | Admitting: *Deleted

## 2012-10-18 MED ORDER — DIAZEPAM 2 MG PO TABS: 2.0000 mg | ORAL_TABLET | Freq: Three times a day (TID) | ORAL | Status: DC | PRN

## 2012-10-18 NOTE — Telephone Encounter (Signed)
Rx called in as prescribed 

## 2012-10-18 NOTE — Telephone Encounter (Signed)
Px written for call in   

## 2012-10-18 NOTE — Telephone Encounter (Signed)
Last filled 09/24/12

## 2012-10-23 ENCOUNTER — Ambulatory Visit: Payer: 59 | Admitting: Rehabilitative and Restorative Service Providers"

## 2012-10-26 ENCOUNTER — Ambulatory Visit: Payer: 59 | Admitting: Rehabilitative and Restorative Service Providers"

## 2012-10-29 ENCOUNTER — Ambulatory Visit: Payer: 59 | Admitting: Rehabilitative and Restorative Service Providers"

## 2012-11-02 ENCOUNTER — Ambulatory Visit: Payer: 59 | Attending: Diagnostic Neuroimaging | Admitting: Rehabilitative and Restorative Service Providers"

## 2012-11-02 DIAGNOSIS — R42 Dizziness and giddiness: Secondary | ICD-10-CM | POA: Insufficient documentation

## 2012-11-02 DIAGNOSIS — M542 Cervicalgia: Secondary | ICD-10-CM | POA: Insufficient documentation

## 2012-11-02 DIAGNOSIS — F0781 Postconcussional syndrome: Secondary | ICD-10-CM | POA: Insufficient documentation

## 2012-11-02 DIAGNOSIS — IMO0001 Reserved for inherently not codable concepts without codable children: Secondary | ICD-10-CM | POA: Insufficient documentation

## 2012-11-05 ENCOUNTER — Ambulatory Visit: Payer: 59 | Admitting: Rehabilitative and Restorative Service Providers"

## 2012-11-07 ENCOUNTER — Ambulatory Visit: Payer: 59 | Admitting: Physical Therapy

## 2012-11-13 ENCOUNTER — Ambulatory Visit: Payer: 59 | Admitting: Physical Therapy

## 2012-11-15 ENCOUNTER — Other Ambulatory Visit: Payer: Self-pay

## 2012-11-15 ENCOUNTER — Ambulatory Visit: Payer: 59 | Admitting: Physical Therapy

## 2012-11-15 MED ORDER — OMEPRAZOLE 40 MG PO CPDR: 40.0000 mg | DELAYED_RELEASE_CAPSULE | Freq: Every day | ORAL | Status: DC

## 2012-11-15 NOTE — Telephone Encounter (Signed)
Pt [facility]. Advised pt done,.

## 2012-11-23 ENCOUNTER — Other Ambulatory Visit: Payer: Self-pay | Admitting: Family Medicine

## 2012-11-23 NOTE — Telephone Encounter (Signed)
OK to refill

## 2012-11-24 NOTE — Telephone Encounter (Signed)
Ok to refill 60, 2 refills  I'll cc Dr. Milinda Antis, I don't think she sees me chronically for anything. I think it would be fine for her to use this intermittently prn indefinitely

## 2012-11-26 ENCOUNTER — Other Ambulatory Visit: Payer: Self-pay | Admitting: Family Medicine

## 2012-11-26 MED ORDER — OMEPRAZOLE 40 MG PO CPDR: 40.0000 mg | DELAYED_RELEASE_CAPSULE | Freq: Every day | ORAL | Status: DC

## 2012-11-26 MED ORDER — TRAMADOL HCL 50 MG PO TABS: 50.0000 mg | ORAL_TABLET | Freq: Three times a day (TID) | ORAL | Status: DC | PRN

## 2012-11-26 NOTE — Telephone Encounter (Signed)
Rx called in as prescribed 

## 2012-11-26 NOTE — Telephone Encounter (Signed)
Physician reviewed. Not a side effect. Taking now.

## 2012-11-26 NOTE — Telephone Encounter (Signed)
Px written for call in   

## 2012-11-26 NOTE — Telephone Encounter (Signed)
See allergy/contraindiction. Is it okay to refill?

## 2012-11-27 ENCOUNTER — Telehealth: Payer: Self-pay

## 2012-11-27 NOTE — Telephone Encounter (Signed)
Done and in IN box 

## 2012-11-27 NOTE — Telephone Encounter (Signed)
Prior auth needed for omeprazole; PA form on Dr Royden Purl shelf.

## 2012-11-28 ENCOUNTER — Ambulatory Visit: Payer: 59 | Admitting: Rehabilitative and Restorative Service Providers"

## 2012-11-28 NOTE — Telephone Encounter (Signed)
PA faxed

## 2012-11-30 ENCOUNTER — Ambulatory Visit: Payer: 59 | Admitting: Rehabilitative and Restorative Service Providers"

## 2012-12-06 ENCOUNTER — Ambulatory Visit: Payer: 59 | Attending: Diagnostic Neuroimaging | Admitting: Physical Therapy

## 2012-12-06 DIAGNOSIS — R42 Dizziness and giddiness: Secondary | ICD-10-CM | POA: Insufficient documentation

## 2012-12-06 DIAGNOSIS — M542 Cervicalgia: Secondary | ICD-10-CM | POA: Insufficient documentation

## 2012-12-06 DIAGNOSIS — F0781 Postconcussional syndrome: Secondary | ICD-10-CM | POA: Insufficient documentation

## 2012-12-06 DIAGNOSIS — IMO0001 Reserved for inherently not codable concepts without codable children: Secondary | ICD-10-CM | POA: Insufficient documentation

## 2012-12-07 ENCOUNTER — Ambulatory Visit: Payer: 59 | Admitting: Rehabilitative and Restorative Service Providers"

## 2012-12-10 ENCOUNTER — Ambulatory Visit: Payer: 59 | Admitting: Physical Therapy

## 2012-12-13 NOTE — Telephone Encounter (Signed)
PA approved, pharmacy notified, and approval letter placed in your inbox

## 2012-12-14 ENCOUNTER — Ambulatory Visit: Payer: 59 | Admitting: Rehabilitative and Restorative Service Providers"

## 2012-12-19 ENCOUNTER — Ambulatory Visit: Payer: 59 | Admitting: Rehabilitative and Restorative Service Providers"

## 2012-12-20 ENCOUNTER — Other Ambulatory Visit (INDEPENDENT_AMBULATORY_CARE_PROVIDER_SITE_OTHER): Payer: 59

## 2012-12-20 DIAGNOSIS — Z0000 Encounter for general adult medical examination without abnormal findings: Secondary | ICD-10-CM

## 2012-12-20 LAB — CBC WITH DIFFERENTIAL/PLATELET
Basophils Absolute: 0 10*3/uL (ref 0.0–0.1)
Eosinophils Relative: 3.4 % (ref 0.0–5.0)
HCT: 36 % (ref 36.0–46.0)
Hemoglobin: 12.3 g/dL (ref 12.0–15.0)
Lymphocytes Relative: 43.8 % (ref 12.0–46.0)
Lymphs Abs: 2.9 10*3/uL (ref 0.7–4.0)
Monocytes Relative: 7.1 % (ref 3.0–12.0)
Neutro Abs: 3 10*3/uL (ref 1.4–7.7)
RDW: 14.5 % (ref 11.5–14.6)
WBC: 6.6 10*3/uL (ref 4.5–10.5)

## 2012-12-20 LAB — LIPID PANEL
Cholesterol: 168 mg/dL (ref 0–200)
HDL: 44.4 mg/dL (ref >39.00)
LDL Cholesterol: 112 mg/dL — ABNORMAL HIGH (ref 0–99)
VLDL: 11.2 mg/dL (ref 0.0–40.0)

## 2012-12-20 LAB — TSH: TSH: 1.15 u[IU]/mL (ref 0.35–5.50)

## 2012-12-21 ENCOUNTER — Encounter: Payer: Self-pay | Admitting: Family Medicine

## 2012-12-21 ENCOUNTER — Ambulatory Visit (INDEPENDENT_AMBULATORY_CARE_PROVIDER_SITE_OTHER): Payer: 59 | Admitting: Family Medicine

## 2012-12-21 VITALS — BP 106/64 | HR 74 | Temp 98.6°F | Ht 65.5 in | Wt 149.0 lb

## 2012-12-21 DIAGNOSIS — Z0000 Encounter for general adult medical examination without abnormal findings: Secondary | ICD-10-CM

## 2012-12-21 DIAGNOSIS — L309 Dermatitis, unspecified: Secondary | ICD-10-CM

## 2012-12-21 DIAGNOSIS — Z23 Encounter for immunization: Secondary | ICD-10-CM

## 2012-12-21 DIAGNOSIS — R51 Headache: Secondary | ICD-10-CM

## 2012-12-21 DIAGNOSIS — L259 Unspecified contact dermatitis, unspecified cause: Secondary | ICD-10-CM
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
## 2012-12-21 NOTE — Patient Instructions (Addendum)
Gradually increase exercise at your own pace Labs look ok  For cholesterol Avoid red meat/ fried foods/ egg yolks/ fatty breakfast meats/ butter, cheese and high fat dairy/ and shellfish   Flu vaccine today  Call your neurology office to make appt for headaches - also let them know about your father's history of aneurysm  For eczema on your leg - use a scent free moisturizer often and try some cort aid or other over the counter hydrocortisone cream

## 2012-12-23 NOTE — Assessment & Plan Note (Signed)
Sounds like her post concussive ha are transforming into daily migraine Since she has a neurologist-enc f/u  She may benefit from migraine prev med

## 2012-12-23 NOTE — Assessment & Plan Note (Signed)
Recommend good moisturizers/ avoid hot water and try otc hydrocortisone- update if no imp

## 2012-12-23 NOTE — Assessment & Plan Note (Signed)
Reviewed health habits including diet and exercise and skin cancer prevention Also reviewed health mt list, fam hx and immunizations   Wellness labs reviewed  

## 2012-12-26 ENCOUNTER — Ambulatory Visit: Payer: 59 | Admitting: Physical Therapy

## 2013-01-07 ENCOUNTER — Encounter: Payer: Self-pay | Admitting: Diagnostic Neuroimaging

## 2013-01-07 ENCOUNTER — Ambulatory Visit (INDEPENDENT_AMBULATORY_CARE_PROVIDER_SITE_OTHER): Payer: 59 | Admitting: Diagnostic Neuroimaging

## 2013-01-07 VITALS — BP 113/71 | HR 73 | Temp 98.6°F

## 2013-01-07 DIAGNOSIS — F0781 Postconcussional syndrome: Secondary | ICD-10-CM | POA: Insufficient documentation

## 2013-01-07 DIAGNOSIS — Z823 Family history of stroke: Secondary | ICD-10-CM

## 2013-01-07 DIAGNOSIS — Z8249 Family history of ischemic heart disease and other diseases of the circulatory system: Secondary | ICD-10-CM

## 2013-01-07 MED ORDER — AMITRIPTYLINE HCL 25 MG PO TABS: 25.0000 mg | ORAL_TABLET | Freq: Every day | ORAL | Status: DC
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
## 2013-01-14 ENCOUNTER — Telehealth: Payer: Self-pay | Admitting: *Deleted

## 2013-01-16 ENCOUNTER — Ambulatory Visit (INDEPENDENT_AMBULATORY_CARE_PROVIDER_SITE_OTHER): Payer: 59

## 2013-01-16 DIAGNOSIS — F0781 Postconcussional syndrome: Secondary | ICD-10-CM

## 2013-01-16 DIAGNOSIS — Z823 Family history of stroke: Secondary | ICD-10-CM

## 2013-01-16 DIAGNOSIS — Z8249 Family history of ischemic heart disease and other diseases of the circulatory system: Secondary | ICD-10-CM

## 2013-01-17 MED ORDER — GADOPENTETATE DIMEGLUMINE 469.01 MG/ML IV SOLN: 14.0000 mL | Freq: Once | INTRAVENOUS | Status: AC | PRN

## 2013-01-18 ENCOUNTER — Telehealth: Payer: Self-pay | Admitting: Diagnostic Neuroimaging

## 2013-01-18 NOTE — Telephone Encounter (Signed)
Spoke to patient. Gave MRI results. 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 Follow up w/ Dr. Marjory Lies w/ additional questions. Patient agreed.

## 2013-01-21 ENCOUNTER — Encounter: Payer: Self-pay | Admitting: Family Medicine

## 2013-01-21 ENCOUNTER — Encounter: Payer: Self-pay | Admitting: Diagnostic Neuroimaging

## 2013-01-22 ENCOUNTER — Telehealth: Payer: Self-pay | Admitting: Diagnostic Neuroimaging
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
## 2013-01-24 ENCOUNTER — Encounter: Payer: Self-pay | Admitting: Diagnostic Neuroimaging
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
## 2013-02-06 ENCOUNTER — Other Ambulatory Visit: Payer: Self-pay | Admitting: Family Medicine

## 2013-02-06 NOTE — Telephone Encounter (Signed)
Last office visit 12/21/2012.  Ok to refill? 

## 2013-02-06 NOTE — Telephone Encounter (Signed)
Px written for call in   

## 2013-02-07 ENCOUNTER — Ambulatory Visit (INDEPENDENT_AMBULATORY_CARE_PROVIDER_SITE_OTHER): Payer: 59 | Admitting: Internal Medicine

## 2013-02-07 ENCOUNTER — Encounter: Payer: Self-pay | Admitting: Internal Medicine

## 2013-02-07 VITALS — BP 140/80 | HR 80 | Temp 98.5°F | Wt 156.0 lb

## 2013-02-07 DIAGNOSIS — R51 Headache: Secondary | ICD-10-CM

## 2013-02-07 DIAGNOSIS — Z8249 Family history of ischemic heart disease and other diseases of the circulatory system: Secondary | ICD-10-CM

## 2013-02-07 MED ORDER — KETOROLAC TROMETHAMINE 30 MG/ML IJ SOLN
30.0000 mg | Freq: Once | INTRAMUSCULAR | Status: AC
  Administered 2013-02-07: 30 mg via INTRAMUSCULAR

## 2013-02-07 MED ORDER — PROMETHAZINE HCL 25 MG/ML IJ SOLN
25.0000 mg | Freq: Once | INTRAMUSCULAR | Status: AC
  Administered 2013-02-07: 25 mg via INTRAMUSCULAR

## 2013-02-07 NOTE — Assessment & Plan Note (Signed)
Continue to follow with nuerology New referral today

## 2013-02-07 NOTE — Patient Instructions (Signed)

## 2013-02-07 NOTE — Addendum Note (Signed)
Addended by: Sueanne Margarita on: 02/07/2013 10:31 AM   Modules accepted: Orders

## 2013-02-07 NOTE — Assessment & Plan Note (Signed)
Chronic but worse today Will give Phenergan 25 mg and Toradol 30 mg IM today Continue to use your tramadol and advil until you followup with neurology Pt request new referral to different neurologist referral placed today Go home, get some rest and drink plenty of fluids  If no better in 24 hours or if headache gets worse, go to ER immediately

## 2013-02-07 NOTE — Telephone Encounter (Signed)
Rx called in as prescribed 
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
## 2013-02-11 ENCOUNTER — Telehealth: Payer: Self-pay

## 2013-02-11 NOTE — Telephone Encounter (Signed)
I looked over her MRI/MRA from neuro that are reassuring  It looks like she is going to see Dr Everlena Cooper in early Dec  [place]- any chance we could get her in earlier with [facility] neurology due to her unrelenting headache ?

## 2013-02-11 NOTE — Telephone Encounter (Signed)
Pt has called back h/a, dizziness and nausea now.pt wants to know if any testing needs to be done. BP elevated when seen 02/07/13. Pt checked BP 02/08/13 126/80.[facility]. Pt request cb.

## 2013-02-11 NOTE — Telephone Encounter (Signed)
Pt seen 02/07/13 with h/a; h/a is no better; pt has had h/a since 02/06/13. Med not helping . [facility]. Called pt to get more info but no answer.Please advise.

## 2013-02-12 NOTE — Telephone Encounter (Signed)
Appt moved up with Dr Everlena Cooper on 02/21/13 at 11:30, patient called and notified.

## 2013-02-14 NOTE — Telephone Encounter (Signed)
I didn't order MRA neck. -VRP

## 2013-02-15 ENCOUNTER — Encounter: Payer: 59 | Admitting: Gynecology

## 2013-02-15 ENCOUNTER — Telehealth: Payer: Self-pay | Admitting: Family Medicine

## 2013-02-15 NOTE — Telephone Encounter (Signed)
Pt left vm asking if it was ok to take ibuprofen and ultram for HA.  Attempted to call pt back, no answer.

## 2013-02-16 NOTE — Telephone Encounter (Signed)
That is ok 

## 2013-02-18 NOTE — Telephone Encounter (Signed)
Pt advise

## 2013-02-21 ENCOUNTER — Ambulatory Visit (INDEPENDENT_AMBULATORY_CARE_PROVIDER_SITE_OTHER): Payer: 59 | Admitting: Neurology

## 2013-02-21 ENCOUNTER — Encounter: Payer: Self-pay | Admitting: Neurology

## 2013-02-21 VITALS — BP 110/68 | HR 78 | Temp 98.4°F | Ht 63.0 in | Wt 154.0 lb

## 2013-02-21 DIAGNOSIS — G43019 Migraine without aura, intractable, without status migrainosus: Secondary | ICD-10-CM

## 2013-02-21 DIAGNOSIS — G4440 Drug-induced headache, not elsewhere classified, not intractable: Secondary | ICD-10-CM

## 2013-02-21 MED ORDER — PROPRANOLOL HCL 40 MG PO TABS: 40.0000 mg | ORAL_TABLET | Freq: Two times a day (BID) | ORAL | Status: DC

## 2013-02-21 MED ORDER — PREDNISONE 10 MG PO TABS: ORAL_TABLET | ORAL | Status: DC

## 2013-02-21 NOTE — Patient Instructions (Addendum)
1.  We will start propranolol 40mg  twice daily.  Side effects may cause sleepiness or lightheadedness.  Call in 1 month with update and we can adjust dose at that time. 2.  I will prescribe you a 6 day course of prednisone to break cycle of daily headache.  Take 6 pills for 1day, then 5 pills for 1 day, then 4 pills for 1 day, then 3 pills for 1 day, then 2 pills for 1 day, then 1 pill for 1 day, then stop.  Do not take ibuprofen or other NSAIDs while taking the steroid. 3.  You may continue to take ibuprofen and/or Ultram for severe headaches but must limit use to no more than 2 days out of the month to prevent medication-overuse headache. 4.  Follow up in 3 months. 5.  We should repeat MRA in one year.
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
## 2013-03-06 ENCOUNTER — Telehealth: Payer: Self-pay | Admitting: Neurology

## 2013-03-06 NOTE — Telephone Encounter (Signed)
Migraine today, anything else we can try. Current med not working / Pensions consultant

## 2013-03-07 ENCOUNTER — Other Ambulatory Visit: Payer: Self-pay | Admitting: Neurology

## 2013-03-07 ENCOUNTER — Telehealth: Payer: Self-pay | Admitting: Neurology

## 2013-03-07 ENCOUNTER — Ambulatory Visit: Payer: 59 | Admitting: Neurology

## 2013-03-07 MED ORDER — TRAMADOL HCL 50 MG PO TABS: 50.0000 mg | ORAL_TABLET | ORAL | Status: DC | PRN

## 2013-03-07 NOTE — Telephone Encounter (Signed)
Returned a call from [place] from a vm message she left me yesterday at 12:44 pm re: migraine. She states she has a migraine which started yesterday; rates the pain at a 7 out of a 10; is taking the Propranolol 40 mg BID and completed the Prednisone taper. She is requesting something else to take for the HA. Denies taking the Tramadol (filled 02/06/13 #60) as she doesn't have anymore. Advil not helping either. I told her I would check with Dr. Everlena Cooper and get his recommendation and get back with her. She is ok with this plan. Next appointment is scheduled 05/23/13. **Dr. Everlena Cooper, please advise.

## 2013-03-08 NOTE — Telephone Encounter (Signed)
Med called in late yesterday afternoon as verbally discussed and patient was notified.

## 2013-03-08 NOTE — Telephone Encounter (Signed)
As discussed, refill tramadol but only 30 pills with no refills.  She is obviously taking way too much and as I instructed her, she should not be taking more than 2 days out of the week.  Otherwise she will likely continue to have regular or daily headaches.

## 2013-03-26 ENCOUNTER — Encounter: Payer: Self-pay | Admitting: Gynecology

## 2013-03-26 ENCOUNTER — Ambulatory Visit (INDEPENDENT_AMBULATORY_CARE_PROVIDER_SITE_OTHER): Payer: 59 | Admitting: Gynecology

## 2013-03-26 VITALS — BP 120/78 | Ht 63.0 in | Wt 157.0 lb

## 2013-03-26 DIAGNOSIS — N898 Other specified noninflammatory disorders of vagina: Secondary | ICD-10-CM

## 2013-03-26 DIAGNOSIS — Z01419 Encounter for gynecological examination (general) (routine) without abnormal findings: Secondary | ICD-10-CM

## 2013-03-26 DIAGNOSIS — N9489 Other specified conditions associated with female genital organs and menstrual cycle: Secondary | ICD-10-CM

## 2013-03-26 LAB — WET PREP FOR TRICH, YEAST, CLUE: Trich, Wet Prep: NONE SEEN

## 2013-03-26 MED ORDER — METRONIDAZOLE 500 MG PO TABS: 500.0000 mg | ORAL_TABLET | Freq: Two times a day (BID) | ORAL | Status: DC

## 2013-03-26 MED ORDER — FLUCONAZOLE 150 MG PO TABS: ORAL_TABLET | ORAL | Status: DC

## 2013-03-26 NOTE — Patient Instructions (Signed)
Take a Diflucan pill daily for 3 days and then 1 a week until gone. Take Flagyl medication twice daily for 7 days. Avoid alcohol while taking. Followup if your right lower quadrant pain continues. Followup if her vaginal dryness or irritation continues despite the above antibiotic treatment. Followup in one year for annual exam.

## 2013-03-26 NOTE — Progress Notes (Signed)
[name] January 06, 1966 [id number]        47 y.o.  [id number] for annual exam.  Several issues noted below.  Past medical history,surgical history, problem list, medications, allergies, family history and social history were all reviewed and documented in the EPIC chart.  ROS:  Performed and pertinent positives and negatives are included in the history, assessment and plan .  Exam: Sherrilyn Rist assistant Filed Vitals:   03/26/13 1614  BP: 120/78  Height: 5\' 3"  (1.6 m)  Weight: 157 lb (71.215 kg)   General appearance  Normal Skin grossly normal Head/Neck normal with no cervical or supraclavicular adenopathy thyroid normal Lungs  clear Cardiac RR, without RMG Abdominal  soft, nontender, without masses, organomegaly or hernia Breasts  examined lying and sitting without masses, retractions, discharge or axillary adenopathy. Pelvic  Ext/BUS/vagina  Normal with thick white discharge   Adnexa  Without masses or tenderness    Anus and perineum  Normal   Rectovaginal  Normal sphincter tone without palpated masses or tenderness.    Assessment/Plan:  47 y.o. [id number] female for annual exam. Status post LAVH 2012. Laparoscopic RSO, lysis of adhesions 2014.  1. Recurrent vaginitis. Patient notes recurrent vaginitis type symptoms and a chronic vaginal irritation with intercourse. Exam today consistent with yeast. Wet prep does show yeast and some clue cells. She was treated in July for similar complaints but never took the Flagyl. Recommend Diflucan 150 mg daily for 3 days and then once weekly x3 months to see if we can suppress her and clear upper vaginal irritation/dyspareunia. Also recommended Flagyl 500 mg twice a day for completeness and she agrees to take this. Alcohol avoidance reviewed. If vaginal dryness continues despite the above treatment she'll represent for further evaluation. I did order an [facility] today. She is not having other symptoms such as hot flashes or night sweats. 2. Right lower quadrant  pain. Patient has some aching sharp stabbing right lower quadrant pain of the past week. No diarrhea or constipation nausea or vomiting. No urinary symptoms. She is status post RSO in the past. Recommend observation at present. Continue she'll represent consider more involved evaluation. 3. Pap smear 2012. No Pap smear done today. She is status post hysterectomy for benign indications. No history of significant abnormal Pap smears previously. His does give a history of positive HPV with some abnormalities which sounded to be low-grade but no recent abnormalities. Options to stop screening altogether or less frequent screening intervals reviewed. We'll readdress on an annual basis. 4. Mammography 07/2012 in workup of her left breast abscess. It appears that her last routine mammogram was December 2013. Asked her to call and schedule a screening mammogram and she agrees to do so. SBE monthly reviewed. 5. Health maintenance. No routine blood work done as this is done through her other physician's offices. Followup one year, sooner as needed.   Note: This document was prepared with digital dictation and possible smart phrase technology. Any transcriptional errors that result from this process are unintentional.   Dara Lords MD, 4:50 PM 03/26/2013

## 2013-03-27 LAB — FOLLICLE STIMULATING HORMONE: FSH: 2.4 m[IU]/mL

## 2013-03-29 ENCOUNTER — Other Ambulatory Visit: Payer: Self-pay | Admitting: Family Medicine

## 2013-04-02 ENCOUNTER — Telehealth: Payer: Self-pay | Admitting: Neurology

## 2013-04-02 NOTE — Telephone Encounter (Signed)
Returning a call to the patient after getting her vm message today at 2:56 pm. She was calling with an update as directed by Dr. Everlena Cooper. She is taking the Propranolol 40 mg BIB and is still having daily HA; some days worse than others. Asked whether she felt the med was helping and she stated she didn't think it was. She is wondering about a dose increase but does NOT want to be drowsy and/or sleepy as she has to work. I told her that I would let Dr. Everlena Cooper klnow her current situation and see what he may want to recommend. She is ok with this plan. **Dr. Everlena Cooper, please advise.

## 2013-04-03 ENCOUNTER — Other Ambulatory Visit: Payer: Self-pay | Admitting: Neurology

## 2013-04-03 MED ORDER — PROPRANOLOL HCL 60 MG PO TABS: ORAL_TABLET | ORAL | Status: DC

## 2013-04-03 NOTE — Telephone Encounter (Signed)
Spoke with [employer]. Aware of dose change and that a new script e-scribed to her pharmacy. Will call with an update in several weeks.

## 2013-04-03 NOTE — Telephone Encounter (Signed)
I would increase propranolol to 60mg  twice daily and get another update in 4 weeks.

## 2013-04-12 ENCOUNTER — Ambulatory Visit: Payer: 59 | Admitting: Nurse Practitioner

## 2013-04-12 ENCOUNTER — Telehealth: Payer: Self-pay | Admitting: Nurse Practitioner

## 2013-04-12 NOTE — Telephone Encounter (Signed)
Patient was no show for office appointment today.

## 2013-04-25 ENCOUNTER — Ambulatory Visit: Payer: 59 | Admitting: Internal Medicine

## 2013-04-25 ENCOUNTER — Telehealth: Payer: Self-pay | Admitting: Family Medicine

## 2013-04-25 NOTE — Telephone Encounter (Signed)
Pt said her sxs are better and that's why she cancelled her appt with Webb Silversmith, NP, pt said she will schedule another appt if her sxs return

## 2013-04-25 NOTE — Telephone Encounter (Signed)
[facility] Triage Call Report Triage Record Num: [id number] Operator: Thereasa Parkin Patient Name: [name] Call Date & Time: 04/24/2013 5:39:13PM Patient Phone: [phone] PCP: Wynelle Fanny. Tower Patient Gender: Female PCP Fax : Patient DOB: Jan 08, 1966 Practice Name: [place] [facility] Reason for Call: Caller: [name]/Patient; PCP: Loura Pardon [facility]); CB#: [phone]; Call regarding Ear pain with sore throat; Onset: 04/23/13. Afebrile. LMP/hysterectomy. Pain is worse in right ear and if yawns or swallows. Ear pain rated 7/10. No improvement to pain after 800 mg Motrin. Already has appointment for 04/25/13 at 1115 with Avie Echevaria NP. Advised to see [facility] within 4 hours for severe pain unresponsive to 24 hours of home care per [facility]. Offered to cancel appointment for 04/25/13 in office but she declined. Protocol(s) Used: Ear: Symptoms Recommended Outcome per Protocol: See Provider within 4 hours Reason for Outcome: Severe pain (sharp, stabbing, throbbing or excruciating aching) unresponsive to 24 hours of home care Care Advice: A warm washcloth or heating pad set on low to the affected ear may help relieve the discomfort. May apply for 15 to 20 minutes, 3 to 4 times a day. ~ During pregnancy, call provider if temperature is 100 F (37.7 C) or greater OR any temperature elevation for 3 days even while taking acetaminophen. ~ Resting or sleeping with head elevated, such as semi-reclining in a recliner, may help reduce inner ear pressure and discomfort. ~ Keep ear canal as dry as possible. Take a bath instead of showering. Try to keep water out of ear when washing hair by placing cotton balls in ear opening. Avoid swimming or water sports until [place] by provider. ~ ~ SYMPTOM / CONDITION MANAGEMENT Analgesic/Antipyretic Advice - NSAIDs: Consider aspirin, ibuprofen, naproxen or ketoprofen for pain or fever as directed on label or by  pharmacist/provider. PRECAUTIONS: - If over 61 years of age, should not take longer than 1 week without consulting provider. EXCEPTIONS: - Should not be used if taking blood thinners or have bleeding problems. - Do not use if have history of sensitivity/allergy to any of these medications; or history of cardiovascular, ulcer, kidney, liver disease or diabetes unless approved by provider. - Do not exceed recommended dose or frequency. ~ Speak with provider as soon as possible if having: - discharge from ear that does not look like earwax or that has bad odor - increased redness or swelling of external ear - worsening pain - new or worsening dizziness - or unsteadiness. ~ 04/24/2013 5:52:31PM Page 1 of 1 [email or web address]

## 2013-04-25 NOTE — Telephone Encounter (Signed)
It looks like she cancelled the appt with Rollene Fare - please make sure she went to UC or other -thanks

## 2013-05-04 ENCOUNTER — Ambulatory Visit (INDEPENDENT_AMBULATORY_CARE_PROVIDER_SITE_OTHER): Payer: 59 | Admitting: Family Medicine

## 2013-05-04 ENCOUNTER — Encounter: Payer: Self-pay | Admitting: Family Medicine

## 2013-05-04 VITALS — BP 130/80 | HR 75 | Temp 98.2°F | Ht 63.0 in | Wt 163.5 lb

## 2013-05-04 DIAGNOSIS — M26629 Arthralgia of temporomandibular joint, unspecified side: Secondary | ICD-10-CM

## 2013-05-04 DIAGNOSIS — M2669 Other specified disorders of temporomandibular joint: Secondary | ICD-10-CM

## 2013-05-04 NOTE — Progress Notes (Signed)
   Subjective:    Patient ID: [name], female    DOB: 05-May-1965, 48 y.o.   MRN: [id number]  HPI [name] is a 48 year old married female nonsmoker who comes in today with a three-week history of alternating ear pain  About 3 weeks ago she noticed pain in her right and left ear. She made an appointment to see her doctor however the pain went away and she canceled the appointment. Over the last couple weeks has gotten worse. Initially bilateral now left-sided and it hurts when she chews. She's had no fever sore throat etc.   Review of Systems View of systems otherwise negative    Objective:   Physical Exam Well-developed well-nourished female no acute distress vital signs stable she is afebrile ENT exam normal except for bilateral TMJ tenderness neck was supple no adenopathy       Assessment & Plan:  TMJ syndrome plan see orders

## 2013-05-04 NOTE — Progress Notes (Signed)
Pre-visit discussion using our clinic review tool. No additional management support is needed unless otherwise documented below in the visit note.  

## 2013-05-04 NOTE — Patient Instructions (Signed)
Soft diet  No chewing gum  Motrin 600 mg twice daily with food  Mouthguard at bedtime removing the morning  If your symptoms persist then check you with your dentist

## 2013-05-06 ENCOUNTER — Telehealth: Payer: Self-pay | Admitting: Family Medicine

## 2013-05-06 NOTE — Telephone Encounter (Signed)
[facility] Triage Call Report Triage Record Num: [id number] Operator: Ashok Croon Patient Name: [name] Call Date & Time: 05/04/2013 12:16:45PM Patient Phone: [phone] PCP: Wynelle Fanny. [place] Patient Gender: Female PCP Fax : Patient DOB: 07/11/1965 Practice Name: [place] Reason for Call: Caller: [name]; PCP: [name]); CB#: [phone]; Call regarding Ear pain and severe sore throat; Onset 05/02/2013; Afebrile; All emergent s/sx of Ear: Symptoms ruled out. Home care advice given. Appt scheduled at [facility] office for 12:45pm. Protocol(s) Used: Ear: Symptoms Recommended Outcome per Protocol: See Provider within 24 hours Reason for Outcome: Constant or intermittent dull earache, throbbing in ear(s) or feeling of fullness; may interfere with sleep or ability to carry out normal activities Care Advice: ~ SYMPTOM / CONDITION MANAGEMENT 05/04/2013 12:26:32PM Page 1 of 1 [email or web address]

## 2013-05-09 ENCOUNTER — Other Ambulatory Visit: Payer: Self-pay | Admitting: Neurology

## 2013-05-09 MED ORDER — TRAMADOL HCL 50 MG PO TABS: 50.0000 mg | ORAL_TABLET | ORAL | Status: DC | PRN

## 2013-05-23 ENCOUNTER — Ambulatory Visit (HOSPITAL_COMMUNITY)
Admission: RE | Admit: 2013-05-23 | Discharge: 2013-05-23 | Disposition: A | Discharge status: Home / Self Care | Payer: 59 | Source: Ambulatory Visit | Attending: Neurology | Admitting: Neurology

## 2013-05-23 ENCOUNTER — Ambulatory Visit (INDEPENDENT_AMBULATORY_CARE_PROVIDER_SITE_OTHER): Payer: 59 | Admitting: Neurology

## 2013-05-23 ENCOUNTER — Encounter: Payer: Self-pay | Admitting: Neurology

## 2013-05-23 VITALS — BP 128/84 | HR 68 | Temp 98.0°F | Resp 18 | Ht 63.0 in | Wt 162.9 lb

## 2013-05-23 DIAGNOSIS — G43009 Migraine without aura, not intractable, without status migrainosus: Secondary | ICD-10-CM

## 2013-05-23 DIAGNOSIS — N200 Calculus of kidney: Secondary | ICD-10-CM | POA: Insufficient documentation

## 2013-05-23 DIAGNOSIS — Z87442 Personal history of urinary calculi: Secondary | ICD-10-CM
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
## 2013-05-23 NOTE — Patient Instructions (Addendum)
1.  We will check imaging to look for any evidence of kidney stones.  If no kidney stones, we can switch from propranolol to topamax.  Remember that with topamax, there is a small (around 1%) chance for kidney stones, so you have to stay hydrated. [facility]  Radiology Dept  2.  Follow up in 3 months.

## 2013-05-27 ENCOUNTER — Telehealth: Payer: Self-pay | Admitting: Neurology

## 2013-05-27 MED ORDER — VENLAFAXINE HCL ER 37.5 MG PO CP24: 37.5000 mg | ORAL_CAPSULE | Freq: Every day | ORAL | Status: DC

## 2013-05-27 NOTE — Telephone Encounter (Signed)
Spoke with Ms. Sedam.  The XR does reveal tiny stones in the lower poles of the kidneys.  Therefore, I do not feel comfortable starting topiramate.  We discussed other options and she is interested in Effexor XR 37.5mg  daily.  We will discontinue the propranolol.

## 2013-05-31 ENCOUNTER — Telehealth: Payer: Self-pay | Admitting: Neurology

## 2013-05-31 NOTE — Telephone Encounter (Signed)
That would be okay.  Instead of starting the Effexor, we can increase the propranolol to 80mg  twice daily.  We would likely have to prescribe 80mg  tablets, #60, R1

## 2013-05-31 NOTE — Telephone Encounter (Signed)
Pt called and would like to speak to a nurse regarding effexer/denlafaxine-er 37.5mg  one tablet a day that Dr. Tomi Likens had Rx her.  Pt is already propranolol 60mg  bid and would like to know if she can just take the propranolol with a different dosage instead of taking effexer.

## 2013-05-31 NOTE — Telephone Encounter (Signed)
She want to d/c Effexor and decrease the propranolol  .Please advise

## 2013-05-31 NOTE — Telephone Encounter (Signed)
Please advise on below  Question

## 2013-06-03 NOTE — Telephone Encounter (Signed)
I assume you mean increase the propranolol?  Increase to 80mg  tablets, 1 tablet twice daily, #60 with R1.  D/c Effexor.

## 2013-07-02 ENCOUNTER — Other Ambulatory Visit: Payer: Self-pay

## 2013-07-02 ENCOUNTER — Ambulatory Visit (INDEPENDENT_AMBULATORY_CARE_PROVIDER_SITE_OTHER): Payer: 59 | Admitting: Family Medicine

## 2013-07-02 ENCOUNTER — Encounter: Payer: Self-pay | Admitting: Family Medicine

## 2013-07-02 VITALS — BP 130/78 | HR 85 | Temp 98.5°F | Wt 163.1 lb

## 2013-07-02 DIAGNOSIS — J209 Acute bronchitis, unspecified: Secondary | ICD-10-CM

## 2013-07-02 DIAGNOSIS — Z1231 Encounter for screening mammogram for malignant neoplasm of breast: Secondary | ICD-10-CM

## 2013-07-02 MED ORDER — HYDROCOD POLST-CHLORPHEN POLST 10-8 MG/5ML PO LQCR: 5.0000 mL | Freq: Every evening | ORAL | Status: DC | PRN

## 2013-07-02 NOTE — Progress Notes (Signed)
BP 130/78  Pulse 85  Temp(Src) 98.5 F (36.9 C) (Tympanic)  Wt 163 lb 1.9 oz (73.991 kg)  SpO2 97%  LMP 11/24/2010   CC: cough  Subjective:    Patient ID: [name], female    DOB: [birth year], 48 y.o.   MRN: [id number]  HPI: [name] is a 48 y.o. female presenting on 07/02/2013 for Cough, Headache and Shortness of Breath   4d h/o head congestion that progressed to productive cough that is not letting her sleep at night - worst last night.  Now with abd strain and sore throat from coughing.  Cough wet sounding but nothing comes up.  Temp to 100 last night.  Some post tussive gagging.  + HA .  Chest > head congestion  No abd pain, nausea or vomiting, ear or tooth pain, PNdrainage. Has tried theraflu.  No smokers at home. No sick contacts at home. No h/o asthma, COPD. Does tend to get colds at this time of year.  Relevant past medical, surgical, family and social history reviewed and updated as indicated.  Allergies and medications reviewed and updated. Current Outpatient Prescriptions on File Prior to Visit  Medication Sig  . fluconazole (DIFLUCAN) 150 MG tablet Take one pill daily for 3 days then one pill weekly until gone  . ibuprofen (ADVIL,MOTRIN) 800 MG tablet TAKE 1 TABLET BY MOUTH EVERY 8 HOURS AS NEEDED FOR PAIN  . Multiple Vitamin (MULTIVITAMIN WITH MINERALS) TABS Take 1 tablet by mouth daily.  Marland Kitchen omeprazole (PRILOSEC) 40 MG capsule Take 1 capsule (40 mg total) by mouth daily.  Marland Kitchen venlafaxine XR (EFFEXOR XR) 37.5 MG 24 hr capsule Take 1 capsule (37.5 mg total) by mouth daily with breakfast.  . [DISCONTINUED] albuterol (PROAIR HFA) 108 (90 BASE) MCG/ACT inhaler Inhale 2 puffs into the lungs every 4 (four) hours as needed.     No current facility-administered medications on file prior to visit.    Review of Systems Per HPI unless specifically indicated above    Objective:    BP 130/78  Pulse 85  Temp(Src) 98.5 F (36.9 C) (Tympanic)  Wt 163 lb 1.9 oz (73.991  kg)  SpO2 97%  LMP 11/24/2010  Physical Exam  Nursing note and vitals reviewed. Constitutional: She appears well-developed and well-nourished. No distress.  HENT:  Head: Normocephalic and atraumatic.  Right Ear: Hearing, tympanic membrane, external ear and ear canal normal.  Left Ear: Hearing, tympanic membrane, external ear and ear canal normal.  Nose: Mucosal edema (with erythema) present. No rhinorrhea. Right sinus exhibits maxillary sinus tenderness. Right sinus exhibits no frontal sinus tenderness. Left sinus exhibits maxillary sinus tenderness. Left sinus exhibits no frontal sinus tenderness.  Mouth/Throat: Uvula is midline and mucous membranes are normal. Posterior oropharyngeal erythema present. No oropharyngeal exudate, posterior oropharyngeal edema or tonsillar abscesses.  Eyes: Conjunctivae and EOM are normal. Pupils are equal, round, and reactive to light. No scleral icterus.  Neck: Normal range of motion. Neck supple.  Cardiovascular: Normal rate, regular rhythm, normal heart sounds and intact distal pulses.   No murmur heard. Pulmonary/Chest: Effort normal and breath sounds normal. No respiratory distress. She has no wheezes. She has no rales.  Coughing fits present but overall clear lungs   Lymphadenopathy:    She has no cervical adenopathy.  Skin: Skin is warm and dry. No rash noted.       Assessment & Plan:   Problem List Items Addressed This Visit   Acute bronchitis - Primary  Of 4 d duration. Anticipate viral bronchitis - treat with tussionex course and ibuprofen during the day Red flags to return discussed.        Follow up plan: Return if symptoms worsen or fail to improve.

## 2013-07-02 NOTE — Assessment & Plan Note (Signed)
Of 4 d duration. Anticipate viral bronchitis - treat with tussionex course and ibuprofen during the day Red flags to return discussed.

## 2013-07-02 NOTE — Progress Notes (Signed)
Pre visit review using our clinic review tool, if applicable. No additional management support is needed unless otherwise documented below in the visit note. 

## 2013-07-02 NOTE — Patient Instructions (Signed)
Sounds like you have a viral bronchitis. Antibiotics are not needed for this.  Viral infections usually take 7-10 days to resolve.  The cough can last a few weeks to go away. Use medication as prescribed: tussionex at night time. Push fluids and plenty of rest. Please return if you are not improving as expected, or if you have high fevers (>101.5) or difficulty swallowing or worsening productive cough. Call clinic with questions.  Good to see you today.

## 2013-07-08 ENCOUNTER — Encounter: Payer: Self-pay | Admitting: Internal Medicine

## 2013-07-08 ENCOUNTER — Telehealth: Payer: Self-pay

## 2013-07-08 ENCOUNTER — Ambulatory Visit (INDEPENDENT_AMBULATORY_CARE_PROVIDER_SITE_OTHER): Payer: 59 | Admitting: Internal Medicine

## 2013-07-08 VITALS — BP 118/74 | HR 83 | Temp 98.7°F | Wt 161.5 lb

## 2013-07-08 DIAGNOSIS — J209 Acute bronchitis, unspecified: Secondary | ICD-10-CM

## 2013-07-08 DIAGNOSIS — R05 Cough: Secondary | ICD-10-CM

## 2013-07-08 DIAGNOSIS — R059 Cough, unspecified: Secondary | ICD-10-CM

## 2013-07-08 MED ORDER — AZITHROMYCIN 250 MG PO TABS: ORAL_TABLET | ORAL | Status: DC

## 2013-07-08 MED ORDER — HYDROCODONE-HOMATROPINE 5-1.5 MG/5ML PO SYRP: 5.0000 mL | ORAL_SOLUTION | Freq: Three times a day (TID) | ORAL | Status: DC | PRN

## 2013-07-08 NOTE — Progress Notes (Signed)
Subjective:    Patient ID: [name], female    DOB: July 01, 1965, 48 y.o.   MRN: [id number]  HPI  Pt presents to the clinic today to f/u cough. On 3/31, she was diagnosed with viral bronchitis. She was started on Tussionex. She reports the Tussionex is not very effective. She is still coughing a lot at night. She is coughing up green mucous and blowing green mucous out of her nose. She does have a burning in her chest, more so when she coughs. She is also taking Mucinex OTC.  Review of Systems      Past Medical History  Diagnosis Date  . Anemia     iron deficiency anemia  . Sickle cell trait   . Allergy     allergic rhinitis  . Personal history of kidney stones   . PPD positive     Hx of  . HPV in female     also some abnormal paps in past  . GERD (gastroesophageal reflux disease)   . Hiatal hernia   . Renal lithiasis 2013  . Complication of anesthesia     slow to awaken  . Headache     post concussion  . 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 ibuprofen (ADVIL,MOTRIN) 800 MG tablet TAKE 1 TABLET BY MOUTH EVERY 8 HOURS AS NEEDED FOR PAIN  60 tablet  0  . Multiple Vitamin (MULTIVITAMIN WITH MINERALS) TABS Take 1 tablet by mouth daily.      Marland Kitchen omeprazole (PRILOSEC) 40 MG capsule Take 1 capsule (40 mg total) by mouth daily.  90 capsule  1  . venlafaxine XR (EFFEXOR XR) 37.5 MG 24 hr capsule Take 1 capsule (37.5 mg total) by mouth daily with breakfast.  30 capsule  3  . [DISCONTINUED] albuterol (PROAIR HFA) 108 (90 BASE) MCG/ACT inhaler Inhale 2 puffs into the lungs every 4 (four) hours as needed.         No current facility-administered medications for this visit.    Allergies  Allergen Reactions  . Banana   . Morphine     REACTION: itch    Family History  Problem Relation Age of Onset  . Cancer Maternal Aunt 54    breast CA  . Cancer Paternal Aunt     uterine CA  . Lupus Cousin     . Anesthesia problems Mother   . Hypertension Mother   . Aneurysm Father   . Colon cancer Neg Hx   . Esophageal cancer Neg Hx   . Stomach cancer Neg Hx   . Rectal cancer Neg Hx     History   Social History  . Marital Status: Married    Spouse Name: [name]    Number of Children: 3  . Years of Education: College   Occupational History  . [place] History Main Topics  . Smoking status: Never Smoker   . Smokeless tobacco: Never Used  . Alcohol Use: No  . Drug Use: No  . Sexual Activity: Yes    Birth Control/ Protection: Surgical   Other Topics Concern  . Not on file   Social History Narrative   Pt lives at home with her spouse.   Caffeine Use: 1-2 cups daily; sodas occasionally     Constitutional: Pt reports headache. Denies fever, malaise, fatigue, or abrupt weight changes.  HEENT: Pt reports nasal  congestion and sore throat. Denies eye pain, eye redness, ear pain, ringing in the ears, wax buildup, runny nose,  bloody nose,. Respiratory: Denies difficulty breathing, shortness of breath, cough or sputum production.     No other specific complaints in a complete review of systems (except as listed in HPI above).  Objective:   Physical Exam   BP 118/74  Pulse 83  Temp(Src) 98.7 F (37.1 C) (Oral)  Wt 161 lb 8 oz (73.256 kg)  SpO2 98%  LMP 11/24/2010 Wt Readings from Last 3 Encounters:  07/08/13 161 lb 8 oz (73.256 kg)  07/02/13 163 lb 1.9 oz (73.991 kg)  05/23/13 162 lb 14.4 oz (73.891 kg)    General: Appears her stated age, well developed, well nourished in NAD. HEENT: Head: normal shape and size; Eyes: sclera white, no icterus, conjunctiva pink, PERRLA and EOMs intact; Ears: Tm's gray and intact, normal light reflex; Nose: mucosa pink and moist, septum midline; Throat/Mouth: Teeth present, mucosa pink and moist, no exudate, lesions or ulcerations noted.  Cardiovascular: Normal rate and rhythm. S1,S2 noted.  No murmur, rubs or  gallops noted. No JVD or BLE edema. No carotid bruits noted. Pulmonary/Chest: Normal effort and positive vesicular breath sounds. No respiratory distress. No wheezes, rales or ronchi noted.   BMET    Component Value Date/Time   NA 138 04/20/2012 1522   K 3.9 04/20/2012 1522   CL 105 04/20/2012 1522   CO2 22 04/20/2012 1522   GLUCOSE 98 04/20/2012 1522   BUN 11 04/20/2012 1522   CREATININE 0.73 04/20/2012 1522   CREATININE 0.69 06/23/2011 0303   CALCIUM 9.4 04/20/2012 1522   GFRNONAA >90 06/23/2011 0303   GFRAA >90 06/23/2011 0303    Lipid Panel     Component Value Date/Time   CHOL 168 12/20/2012 0852   TRIG 56.0 12/20/2012 0852   HDL 44.40 12/20/2012 0852   CHOLHDL 4 12/20/2012 0852   VLDL 11.2 12/20/2012 0852   LDLCALC 112* 12/20/2012 0852    CBC    Component Value Date/Time   WBC 6.6 12/20/2012 0852   RBC 4.53 12/20/2012 0852   HGB 12.3 12/20/2012 0852   HCT 36.0 12/20/2012 0852   PLT 319.0 12/20/2012 0852   MCV 79.5 12/20/2012 0852   MCH 26.3 05/22/2012 1332   MCHC 34.1 12/20/2012 0852   RDW 14.5 12/20/2012 0852   LYMPHSABS 2.9 12/20/2012 0852   MONOABS 0.5 12/20/2012 0852   EOSABS 0.2 12/20/2012 0852   BASOSABS 0.0 12/20/2012 0852    Hgb A1C No results found for this basename: HGBA1C        Assessment & Plan:   Acute bronchitis:  Given duration of symptoms, will treat with z pack She also feels like the Tussionex is not effective, she would like the Hycodan that she has used in the past Ibuprofen for fever/body aches Get some rest and drink plenty of fluids  RTC as needed

## 2013-07-08 NOTE — Patient Instructions (Addendum)

## 2013-07-08 NOTE — Progress Notes (Signed)
Pre visit review using our clinic review tool, if applicable. No additional management support is needed unless otherwise documented below in the visit note. 

## 2013-07-08 NOTE — Telephone Encounter (Signed)
Pt was seen 07/02/13 and advised to come back if cough worsened; Tussionex not helping cough. Pt scheduled appt today with Webb Silversmith NP.

## 2013-07-09 ENCOUNTER — Other Ambulatory Visit: Payer: Self-pay | Admitting: Family Medicine

## 2013-07-15 ENCOUNTER — Ambulatory Visit
Admission: RE | Admit: 2013-07-15 | Discharge: 2013-07-15 | Disposition: A | Discharge status: Home / Self Care | Payer: 59 | Source: Ambulatory Visit

## 2013-07-15 DIAGNOSIS — Z1231 Encounter for screening mammogram for malignant neoplasm of breast: Secondary | ICD-10-CM

## 2013-07-18 ENCOUNTER — Other Ambulatory Visit: Payer: Self-pay

## 2013-07-18 DIAGNOSIS — J209 Acute bronchitis, unspecified: Secondary | ICD-10-CM

## 2013-07-18 DIAGNOSIS — R059 Cough, unspecified: Secondary | ICD-10-CM

## 2013-07-18 DIAGNOSIS — R05 Cough: Secondary | ICD-10-CM

## 2013-07-18 MED ORDER — HYDROCODONE-HOMATROPINE 5-1.5 MG/5ML PO SYRP: 5.0000 mL | ORAL_SOLUTION | Freq: Three times a day (TID) | ORAL | Status: DC | PRN

## 2013-07-18 NOTE — Telephone Encounter (Signed)
Pt was seen 07/08/13; pt still has non prod cough; no wheezing, SOB and no fever. Pt is almost out of Hycodan and that helps pt with coughing at night time. Pt request printed rx for Hycodan. Call when ready for pick up or does pt need to come in for appt.

## 2013-07-18 NOTE — Telephone Encounter (Signed)
Left message on voicemail letting pt know the cough syrup Rx will be in the front office for pick up

## 2013-07-29 ENCOUNTER — Encounter: Payer: Self-pay | Admitting: Internal Medicine

## 2013-07-29 ENCOUNTER — Telehealth: Payer: Self-pay | Admitting: Family Medicine

## 2013-07-29 ENCOUNTER — Ambulatory Visit (INDEPENDENT_AMBULATORY_CARE_PROVIDER_SITE_OTHER): Payer: 59 | Admitting: Internal Medicine

## 2013-07-29 VITALS — BP 110/70 | HR 75 | Temp 98.6°F | Wt 162.0 lb

## 2013-07-29 DIAGNOSIS — R05 Cough: Secondary | ICD-10-CM

## 2013-07-29 DIAGNOSIS — J209 Acute bronchitis, unspecified: Secondary | ICD-10-CM

## 2013-07-29 DIAGNOSIS — R059 Cough, unspecified: Secondary | ICD-10-CM | POA: Insufficient documentation

## 2013-07-29 MED ORDER — HYDROCODONE-HOMATROPINE 5-1.5 MG/5ML PO SYRP: 5.0000 mL | ORAL_SOLUTION | Freq: Three times a day (TID) | ORAL | Status: DC | PRN

## 2013-07-29 MED ORDER — PREDNISONE 20 MG PO TABS: 40.0000 mg | ORAL_TABLET | Freq: Every day | ORAL | Status: DC

## 2013-07-29 NOTE — Progress Notes (Signed)
Pre visit review using our clinic review tool, if applicable. No additional management support is needed unless otherwise documented below in the visit note. 

## 2013-07-29 NOTE — Assessment & Plan Note (Addendum)
Post infectious Spirometry is normal but likely inflammation Will try short course of prednisone to see if that helps If recurs after the prednisone, would consider montelukast

## 2013-07-29 NOTE — Progress Notes (Signed)
Subjective:    Patient ID: [name], female    DOB: January 25, 1966, 48 y.o.   MRN: [id number]  HPI Has been coughing for a month or so Seems to get better than coughing more  Does intense boot camp exercise twice a week--not sure if that is causing the persistent cough Doesn't feel sick in the same way No recent fever Cough mostly dry but occasional white/slight yellow mucus Not really SOB--or just a little Cough increased after 2 [place] [employer] others coughing also)  No wheezing No history of asthma--has never used inhaler (but may have had one prescribed 5 years ago)  Current Outpatient Prescriptions on File Prior to Visit  Medication Sig Dispense Refill  . HYDROcodone-homatropine (HYCODAN) 5-1.5 MG/5ML syrup Take 5 mLs by mouth every 8 (eight) hours as needed for cough.  120 mL  0  . ibuprofen (ADVIL,MOTRIN) 800 MG tablet TAKE 1 TABLET BY MOUTH EVERY 8 HOURS AS NEEDED FOR PAIN  60 tablet  0  . Multiple Vitamin (MULTIVITAMIN WITH MINERALS) TABS Take 1 tablet by mouth daily.      Marland Kitchen omeprazole (PRILOSEC) 40 MG capsule TAKE 1 CAPSULE BY MOUTH ONCE DAILY  90 capsule  0  . [DISCONTINUED] albuterol (PROAIR HFA) 108 (90 BASE) MCG/ACT inhaler Inhale 2 puffs into the lungs every 4 (four) hours as needed.         No current facility-administered medications on file prior to visit.    Allergies  Allergen Reactions  . Banana   . Morphine     REACTION: itch    Past Medical History  Diagnosis Date  . Anemia     iron deficiency anemia  . Sickle cell trait   . Allergy     allergic rhinitis  . Personal history of kidney stones   . PPD positive     Hx of  . HPV in female     also some abnormal paps in past  . GERD (gastroesophageal reflux disease)   . Hiatal hernia   . Renal lithiasis 2013  . Complication of anesthesia     slow to awaken  . Headache     post concussion  . 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 Breast surgery      removal of L breast mass / benign /age [age]  . Cesarean section      x3  . Tubal ligation      BTL  . Tonsillectomy and adenoidectomy      age 34  . Pelvic laparoscopy      x2 for cysts r/o endometriosis  . Laparoscopic vaginal hysterectomy  12/02/2010    menorrhagia, leiomyomata  . Cystoscopy  2008    L RPG, stone extraction ([place])  . Foot surgery  JULY 2013    RIGHT  . Appendectomy      1990's  . Laparoscopy N/A 05/24/2012    Procedure: LAPAROSCOPY OPERATIVE;  Surgeon: Anastasio Auerbach, MD;  Location: [facility];  Service: Gynecology;  Laterality: N/A;  . Salpingoophorectomy Right 05/24/2012    Procedure: SALPINGO OOPHORECTOMY;  Surgeon: Anastasio Auerbach, MD;  Location: [facility];  Service: Gynecology;  Laterality: Right;    Family History  Problem Relation Age of Onset  . Cancer Maternal Aunt 54    breast CA  . Cancer Paternal Aunt     uterine CA  . Lupus Cousin   .  Anesthesia problems Mother   . Hypertension Mother   . Aneurysm Father   . Colon cancer Neg Hx   . Esophageal cancer Neg Hx   . Stomach cancer Neg Hx   . Rectal cancer Neg Hx     History   Social History  . Marital Status: Married    Spouse Name: [name]    Number of Children: 3  . Years of Education: College   Occupational History  . [place] History Main Topics  . Smoking status: Never Smoker   . Smokeless tobacco: Never Used  . Alcohol Use: No  . Drug Use: No  . Sexual Activity: Yes    Birth Control/ Protection: Surgical   Other Topics Concern  . Not on file   Social History Narrative   Pt lives at home with her spouse.   Caffeine Use: 1-2 cups daily; sodas occasionally   Review of Systems Slight eye itching Some rhinorrhea    Objective:   Physical Exam  Constitutional: She appears well-developed and well-nourished. No distress.  Persistent dry cough  HENT:  Mouth/Throat: Oropharynx is clear and moist. No oropharyngeal  exudate.  TMs normal No sinus tenderness Moderate pale nasal congestion  Neck: Normal range of motion. Neck supple. No thyromegaly present.  Pulmonary/Chest: Effort normal and breath sounds normal. No respiratory distress. She has no wheezes. She has no rales.  No dullness  Lymphadenopathy:    She has no cervical adenopathy.          Assessment & Plan:

## 2013-07-29 NOTE — Telephone Encounter (Signed)
Patient Information:  Caller Name: [name]  Phone: [phone]  Patient: [name]  Gender: Female  DOB: Sep 20, 1965  Age: 47 Years  PCP: Loura Pardon [facility])  Pregnant: No  Office Follow Up:  Does the office need to follow up with this patient?: No  Instructions For The Office: N/A   Symptoms  Reason For Call & Symptoms: Patient was seen in office on 07/19/13 and diagnosed with bronchitis. Has completed antibiotic and is taking prescription cough syrup. Reports productive cough with clear/yellow sputum.  Reviewed Health History In EMR: Yes  Reviewed Medications In EMR: Yes  Reviewed Allergies In EMR: Yes  Reviewed Surgeries / Procedures: Yes  Date of Onset of Symptoms: 07/19/2013  Treatments Tried: Hycodan cough syrup, but she does not take a full dose during the day time.  Treatments Tried Worked: No OB / GYN:  LMP: Unknown  Guideline(s) Used:  Cough  Disposition Per Guideline:   See Today in Office  Reason For Disposition Reached:   Severe coughing spells (e.g., whooping sound after coughing, vomiting after coughing)  Advice Given:  N/A  Patient Will Follow Care Advice:  YES  Appointment Scheduled:  07/29/2013 12:15:00 Appointment Scheduled Provider:  Viviana Simpler [facility])

## 2013-08-20 ENCOUNTER — Encounter: Payer: Self-pay | Admitting: Neurology

## 2013-08-20 ENCOUNTER — Ambulatory Visit (INDEPENDENT_AMBULATORY_CARE_PROVIDER_SITE_OTHER): Payer: 59 | Admitting: Neurology

## 2013-08-20 VITALS — BP 100/70 | HR 68 | Resp 16 | Ht 63.0 in | Wt 161.0 lb

## 2013-08-20 DIAGNOSIS — G43909 Migraine, unspecified, not intractable, without status migrainosus: Secondary | ICD-10-CM

## 2013-08-20 DIAGNOSIS — I671 Cerebral aneurysm, nonruptured: Secondary | ICD-10-CM

## 2013-08-20 MED ORDER — IBUPROFEN 800 MG PO TABS: 800.0000 mg | ORAL_TABLET | Freq: Three times a day (TID) | ORAL | Status: DC | PRN
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
## 2013-08-20 NOTE — Patient Instructions (Addendum)
1.  Take ibuprofen 800mg  at earliest onset of migraine.  Limit use to no more than 2 days out of week. 2.  Continue propranolol 60mg  twice daily 3.  Continue proper diet, exercise and keeping hydrated. 4.  Keep headache diary. 5.  Repeat MRA of head in October with follow up soon after. [place] 01/14/14 @3 :45 pm

## 2013-09-13 ENCOUNTER — Other Ambulatory Visit: Payer: Self-pay | Admitting: *Deleted

## 2013-09-13 ENCOUNTER — Telehealth: Payer: Self-pay | Admitting: *Deleted

## 2013-09-13 NOTE — Telephone Encounter (Signed)
Patient states that medication she is taking for headache Inderal  is not helping she has had a headache for 2 days she feels it is related to the heat . She is also taking ibuprofen  800 mg bid as well no relief with this . Please advise

## 2013-09-13 NOTE — Telephone Encounter (Signed)
Propranolol 80 mg # 60 1 po bid called to [facility] and naproxen 500mg  # 90  1 every 6 hours not to exceed more than 3 a day

## 2013-09-13 NOTE — Telephone Encounter (Signed)
I would recommend increasing the propranolol to 80mg  twice daily.  She can try naproxen 500mg .

## 2013-10-03 ENCOUNTER — Other Ambulatory Visit: Payer: Self-pay | Admitting: Family Medicine

## 2013-10-03 NOTE — Telephone Encounter (Signed)
Please refill for 6 mo 

## 2013-10-03 NOTE — Telephone Encounter (Signed)
Electronic refill request, last OV with you was 12/2012 but has had a few recent acute appt., please advise

## 2013-10-04 NOTE — Telephone Encounter (Signed)
done

## 2013-10-16 ENCOUNTER — Telehealth: Payer: Self-pay | Admitting: Neurology

## 2013-10-16 NOTE — Telephone Encounter (Signed)
It sounds like she has been doing well but today she has a headache and feels lightheaded.  I would just treat this headache as any other headache with the ibuprofen.

## 2013-10-16 NOTE — Telephone Encounter (Signed)
Pt would like to speak to a nurse. She is having a headache and is feeling light headed. Please call pt at [phone]

## 2013-10-16 NOTE — Telephone Encounter (Signed)
Patient given instructions per Dr. Tomi Likens.

## 2013-10-16 NOTE — Telephone Encounter (Signed)
See next note

## 2013-10-16 NOTE — Telephone Encounter (Signed)
Patient said that her headache is a little bit better.  She has not had one in a while since her med was increased.  She is still feeling light headed off and on.  She is leaving for a cruise tonight and would like to know if there is anything we can do.

## 2013-11-13 ENCOUNTER — Ambulatory Visit (INDEPENDENT_AMBULATORY_CARE_PROVIDER_SITE_OTHER): Payer: 59 | Admitting: Family Medicine

## 2013-11-13 ENCOUNTER — Encounter: Payer: Self-pay | Admitting: Family Medicine

## 2013-11-13 VITALS — BP 120/74 | HR 70 | Temp 98.5°F | Ht 63.0 in | Wt 162.5 lb

## 2013-11-13 DIAGNOSIS — R059 Cough, unspecified: Secondary | ICD-10-CM

## 2013-11-13 DIAGNOSIS — R05 Cough: Secondary | ICD-10-CM

## 2013-11-13 MED ORDER — HYDROCOD POLST-CHLORPHEN POLST 10-8 MG/5ML PO LQCR: 5.0000 mL | Freq: Two times a day (BID) | ORAL | Status: DC | PRN

## 2013-11-13 MED ORDER — OMEPRAZOLE 40 MG PO CPDR: DELAYED_RELEASE_CAPSULE | ORAL | Status: DC

## 2013-11-13 MED ORDER — MONTELUKAST SODIUM 10 MG PO TABS: 10.0000 mg | ORAL_TABLET | Freq: Every day | ORAL | Status: DC

## 2013-11-13 NOTE — Progress Notes (Signed)
Subjective:    Patient ID: [name], female    DOB: 1965/06/01, 48 y.o.   MRN: [id number]  HPI Here back with a cough  It has been back for a couple of days Also a clear runny nose   No fever  Cough makes her headache worse  A little sore throat-not too bad   Dry hacking cough - occ clear phlegm  Is wondering about allergies   Put on prednisone in May -and it helped   She tried some allergy pills (? Unsure what brand)- help a little bit   This tends to be seasonal   Patient Active Problem List   Diagnosis Date Noted  . Cough 07/29/2013  . 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 Family history of brain aneurysm 01/07/2013  . Eczema 12/21/2012  . Headache(784.0) 08/21/2012  . Breast mass, left 07/05/2012  . ALLERGIC RHINITIS 03/20/2009  . GERD 08/20/2008  . RENAL CALCULUS, HX OF 08/20/2008  . ANEMIA, IRON DEFICIENCY 03/18/2008  . POSITIVE PPD 10/02/2007   Past Medical History  Diagnosis Date  . Anemia     iron deficiency anemia  . Sickle cell trait   . Allergy     allergic rhinitis  . Personal history of kidney stones   . PPD positive     Hx of  . HPV in female     also some abnormal paps in past  . GERD (gastroesophageal reflux disease)   . Hiatal hernia   . Renal lithiasis 2013  . Complication of anesthesia     slow to awaken  . Headache     post concussion  . 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 Breast surgery      removal of L breast mass / benign /age [age]  . Cesarean section      x3  . Tubal ligation      BTL  . Tonsillectomy and adenoidectomy      age 7  . Pelvic laparoscopy      x2 for cysts r/o endometriosis  . Laparoscopic vaginal hysterectomy  12/02/2010    menorrhagia, leiomyomata  . Cystoscopy  2008    L RPG, stone extraction ([place])  . Foot surgery  JULY 2013    RIGHT  . Appendectomy      1990's  . Laparoscopy N/A 05/24/2012    Procedure: LAPAROSCOPY OPERATIVE;   Surgeon: Anastasio Auerbach, MD;  Location: [facility];  Service: Gynecology;  Laterality: N/A;  . Salpingoophorectomy Right 05/24/2012    Procedure: SALPINGO OOPHORECTOMY;  Surgeon: Anastasio Auerbach, MD;  Location: [facility];  Service: Gynecology;  Laterality: Right;   History  Substance Use Topics  . Smoking status: Never Smoker   . Smokeless tobacco: Never Used  . Alcohol Use: No   Family History  Problem Relation Age of Onset  . Cancer Maternal Aunt 54    breast CA  . Cancer Paternal Aunt     uterine CA  . Lupus Cousin   . Anesthesia problems Mother   . Hypertension Mother   . Aneurysm Father   . Colon cancer Neg Hx   . Esophageal cancer Neg Hx   . Stomach cancer Neg Hx   . Rectal cancer Neg Hx    Allergies  Allergen Reactions  . Banana   . Morphine     REACTION: itch   Current Outpatient Prescriptions on File Prior to  Visit  Medication Sig Dispense Refill  . ibuprofen (ADVIL,MOTRIN) 800 MG tablet Take 1 tablet (800 mg total) by mouth every 8 (eight) hours as needed.  30 tablet  1  . Multiple Vitamin (MULTIVITAMIN WITH MINERALS) TABS Take 1 tablet by mouth daily.      . [DISCONTINUED] albuterol (PROAIR HFA) 108 (90 BASE) MCG/ACT inhaler Inhale 2 puffs into the lungs every 4 (four) hours as needed.         No current facility-administered medications on file prior to visit.     Review of Systems Review of Systems  Constitutional: Negative for fever, appetite change, and unexpected weight change.  ENT pos for rhinorrhea/ drip / neg for sinus pain  Eyes: Negative for pain and visual disturbance.  Respiratory: Negative for wheeze  and shortness of breath.   Cardiovascular: Negative for cp or palpitations    Gastrointestinal: Negative for nausea, diarrhea and constipation.  Genitourinary: Negative for urgency and frequency.  Skin: Negative for pallor or rash   Neurological: Negative for weakness, light-headedness, numbness and headaches.  Hematological: Negative for  adenopathy. Does not bruise/bleed easily.  Psychiatric/Behavioral: Negative for dysphoric mood. The patient is not nervous/anxious.         Objective:   Physical Exam  Constitutional: She appears well-developed and well-nourished. No distress.  HENT:  Head: Normocephalic and atraumatic.  Right Ear: External ear normal.  Left Ear: External ear normal.  Mouth/Throat: Oropharynx is clear and moist. No oropharyngeal exudate.  Nares are boggy Some clear rhinorrhea and post nasal drip  Clear throat No sinus tenderness  Eyes: Conjunctivae and EOM are normal. Pupils are equal, round, and reactive to light. Right eye exhibits no discharge. Left eye exhibits no discharge.  Neck: Normal range of motion. Neck supple.  Cardiovascular: Normal rate, regular rhythm and intact distal pulses.  Exam reveals no gallop.   Pulmonary/Chest: Effort normal and breath sounds normal. No respiratory distress. She has no wheezes. She has no rales.  Harsh dry cough  Abdominal: Soft. Bowel sounds are normal. She exhibits no distension and no mass. There is no tenderness.  Musculoskeletal: She exhibits no edema.  Lymphadenopathy:    She has no cervical adenopathy.  Neurological: She is alert.  Skin: Skin is warm and dry. No rash noted.  Psychiatric: She has a normal mood and affect.          Assessment & Plan:   Problem List Items Addressed This Visit     Other   Cough - Primary     Suspect allergic -though could be early uri  Start singulair daily Continue antihistamine  tussionex prn qhs - for severe cough  If no further imp - will try pred taper and consider further eval

## 2013-11-13 NOTE — Progress Notes (Signed)
Pre visit review using our clinic review tool, if applicable. No additional management support is needed unless otherwise documented below in the visit note. 

## 2013-11-13 NOTE — Patient Instructions (Signed)
For cough (I suspect allergic) - continue the daily antihistamine you are taking Also start generic singulair (I sent to your local pharmacy)- once daily (for cough and nasal allergies) Use the cough medicine with caution at night as needed Let me know if not improved in a week (we can try prednisone again)

## 2013-11-14 NOTE — Assessment & Plan Note (Signed)
Suspect allergic -though could be early [employer] daily Continue antihistamine  tussionex prn qhs - for severe cough  If no further imp - will try pred taper and consider further eval

## 2013-12-02 ENCOUNTER — Other Ambulatory Visit: Payer: Self-pay | Admitting: *Deleted

## 2013-12-06 ENCOUNTER — Ambulatory Visit (INDEPENDENT_AMBULATORY_CARE_PROVIDER_SITE_OTHER): Payer: 59 | Admitting: Gynecology

## 2013-12-06 ENCOUNTER — Encounter: Payer: Self-pay | Admitting: Gynecology

## 2013-12-06 DIAGNOSIS — Z1211 Encounter for screening for malignant neoplasm of colon: Secondary | ICD-10-CM

## 2013-12-06 DIAGNOSIS — R1031 Right lower quadrant pain: Secondary | ICD-10-CM

## 2013-12-06 LAB — URINALYSIS W MICROSCOPIC + REFLEX CULTURE
Bilirubin Urine: NEGATIVE
Glucose, UA: NEGATIVE mg/dL
HGB URINE DIPSTICK: NEGATIVE
LEUKOCYTES UA: NEGATIVE
Nitrite: NEGATIVE
PROTEIN: 30 mg/dL — AB
Specific Gravity, Urine: 1.025 (ref 1.005–1.030)
UROBILINOGEN UA: 1 mg/dL (ref 0.0–1.0)
pH: 6 (ref 5.0–8.0)

## 2013-12-06 NOTE — Patient Instructions (Signed)
Follow up for ultrasound as scheduled 

## 2013-12-06 NOTE — Progress Notes (Signed)
[name] [birth year] [id number]        48 y.o.  [id number] presents complaining of persistent right lower quadrant pain and now some left lower quadrant discomfort. I saw her December 2014 with some sharp stabbing right lower quadrant pain. She noted since then that pain resolved after taking her antibiotics but then started having more nagging coming and going discomfort primarily on the right to include when rolling over in bed she will get a sharp stabbing pain. No nausea vomiting diarrhea constipation. No frequency dysuria urgency. Does have a history of renal lithiasis. Is status post LAVH 2012. Subsequent right salpingo-oophorectomy 05/2012 for pelvic pain with normal pathology.  Past medical history,surgical history, problem list, medications, allergies, family history and social history were all reviewed and documented in the EPIC chart.  Directed ROS with pertinent positives and negatives documented in the history of present illness/assessment and plan.  Exam: Kim assistant General appearance:  Normal Spine straight without CVA tenderness Abdomen soft with active bowel sounds. No acute tenderness masses guarding or organomegaly. Pelvic external BUS vagina normal. Bimanual without masses or tenderness. Skin is normal.  OC light done  Assessment/Plan:  48 y.o. [id number] with right lower quadrant pain and now some left quadrant involvement.  OC light negative. Urinalysis pending. Does have history of renal lithiasis. Status post RSO with LAVH in the past. Exam is grossly normal. Plan ultrasound and if normal consider GI referral. From my standpoint options of repeat laparoscopy discussed with her and rule out adhesions/other pathology. I think before proceeding to this though I would have GI clear her. No diarrhea constipation but with the chronicity of the complaint it is worth having her see them. If urinalysis is negative I will hold on urology evaluation as this does not appear to be classic by  history.   Note: This document was prepared with digital dictation and possible smart phrase technology. Any transcriptional errors that result from this process are unintentional.   Anastasio Auerbach MD, 12:30 PM 12/06/2013

## 2013-12-07 ENCOUNTER — Telehealth: Payer: Self-pay | Admitting: Obstetrics & Gynecology

## 2013-12-07 NOTE — Telephone Encounter (Signed)
72 female seen in office by Dr. Phineas Real 9/4 due to both right and left sided pain.  Chart reviewed.  H/O chronic pain issues.  H/O LAVH and RSO. Urinalysis obtained 9/4/515 as well as occult blood testing.  Pt did not know results and wanted this before deciding if she needed to go to the hospital to have her pain evaluated further and for some pain medication.  She knows no pain medicine be called in and that she will have to be seen in the ER if she feels she needs further treatment.  Urinalysis and fecal blood test results relayed.  No evidence of infection.  No clear evidence of stones.  H/O nephrolithiasis on CT 2013 but these were small and high in kidney.  Pt not having any fevers, nausea, emesis, diarrhea.  OTC medications discussed as well as increased fluids IF a stone were the problem.  Phone call taken at 2330 and ended 2350 after answering lots of questions for patient.  Appreciative of phone call.

## 2013-12-08 ENCOUNTER — Emergency Department (HOSPITAL_COMMUNITY)
Admission: EM | Admit: 2013-12-08 | Discharge: 2013-12-08 | Disposition: A | Discharge status: Home / Self Care | Payer: 59 | Attending: Emergency Medicine | Admitting: Emergency Medicine

## 2013-12-08 ENCOUNTER — Emergency Department (HOSPITAL_COMMUNITY): Payer: 59

## 2013-12-08 ENCOUNTER — Encounter (HOSPITAL_COMMUNITY): Payer: Self-pay | Admitting: Emergency Medicine

## 2013-12-08 DIAGNOSIS — K219 Gastro-esophageal reflux disease without esophagitis: Secondary | ICD-10-CM | POA: Diagnosis not present

## 2013-12-08 DIAGNOSIS — R109 Unspecified abdominal pain: Secondary | ICD-10-CM | POA: Diagnosis not present

## 2013-12-08 DIAGNOSIS — Z9089 Acquired absence of other organs: Secondary | ICD-10-CM | POA: Insufficient documentation

## 2013-12-08 DIAGNOSIS — Z862 Personal history of diseases of the blood and blood-forming organs and certain disorders involving the immune mechanism: Secondary | ICD-10-CM | POA: Insufficient documentation

## 2013-12-08 DIAGNOSIS — Z9079 Acquired absence of other genital organ(s): Secondary | ICD-10-CM | POA: Diagnosis not present

## 2013-12-08 DIAGNOSIS — Z87442 Personal history of urinary calculi: Secondary | ICD-10-CM | POA: Diagnosis not present

## 2013-12-08 DIAGNOSIS — Z9889 Other specified postprocedural states: Secondary | ICD-10-CM | POA: Diagnosis not present

## 2013-12-08 DIAGNOSIS — Z79899 Other long term (current) drug therapy: Secondary | ICD-10-CM | POA: Insufficient documentation

## 2013-12-08 DIAGNOSIS — Z90710 Acquired absence of both cervix and uterus: Secondary | ICD-10-CM | POA: Diagnosis not present

## 2013-12-08 DIAGNOSIS — Z8619 Personal history of other infectious and parasitic diseases: Secondary | ICD-10-CM | POA: Diagnosis not present

## 2013-12-08 DIAGNOSIS — Z9851 Tubal ligation status: Secondary | ICD-10-CM | POA: Diagnosis not present

## 2013-12-08 DIAGNOSIS — R339 Retention of urine, unspecified: Secondary | ICD-10-CM | POA: Diagnosis not present

## 2013-12-08 LAB — BASIC METABOLIC PANEL
Anion gap: 15 (ref 5–15)
BUN: 11 mg/dL (ref 6–23)
CALCIUM: 8.7 mg/dL (ref 8.4–10.5)
CO2: 18 mEq/L — ABNORMAL LOW (ref 19–32)
CREATININE: 0.73 mg/dL (ref 0.50–1.10)
Chloride: 109 mEq/L (ref 96–112)
GFR calc Af Amer: >90 mL/min (ref >90)
GFR calc non Af Amer: >90 mL/min (ref >90)
GLUCOSE: 117 mg/dL — AB (ref 70–99)
Potassium: 4 mEq/L (ref 3.7–5.3)
Sodium: 142 mEq/L (ref 137–147)

## 2013-12-08 LAB — CBC WITH DIFFERENTIAL/PLATELET
BASOS PCT: 0 % (ref 0–1)
Basophils Absolute: 0 10*3/uL (ref 0.0–0.1)
EOS ABS: 0.3 10*3/uL (ref 0.0–0.7)
Eosinophils Relative: 3 % (ref 0–5)
HCT: 35.7 % — ABNORMAL LOW (ref 36.0–46.0)
Hemoglobin: 12.7 g/dL (ref 12.0–15.0)
Lymphocytes Relative: 41 % (ref 12–46)
Lymphs Abs: 4.1 10*3/uL — ABNORMAL HIGH (ref 0.7–4.0)
MCH: 26.6 pg (ref 26.0–34.0)
MCHC: 35.6 g/dL (ref 30.0–36.0)
MCV: 74.8 fL — ABNORMAL LOW (ref 78.0–100.0)
MONO ABS: 0.6 10*3/uL (ref 0.1–1.0)
Monocytes Relative: 6 % (ref 3–12)
NEUTROS ABS: 5.1 10*3/uL (ref 1.7–7.7)
Neutrophils Relative %: 50 % (ref 43–77)
Platelets: 303 10*3/uL (ref 150–400)
RBC: 4.77 MIL/uL (ref 3.87–5.11)
RDW: 13.9 % (ref 11.5–15.5)
WBC: 10.1 10*3/uL (ref 4.0–10.5)

## 2013-12-08 LAB — URINALYSIS, ROUTINE W REFLEX MICROSCOPIC
Bilirubin Urine: NEGATIVE
GLUCOSE, UA: NEGATIVE mg/dL
HGB URINE DIPSTICK: NEGATIVE
Ketones, ur: NEGATIVE mg/dL
Leukocytes, UA: NEGATIVE
Nitrite: NEGATIVE
Protein, ur: NEGATIVE mg/dL
Specific Gravity, Urine: 1.007 (ref 1.005–1.030)
Urobilinogen, UA: 1 mg/dL (ref 0.0–1.0)
pH: 6 (ref 5.0–8.0)

## 2013-12-08 LAB — PREGNANCY, URINE: Preg Test, Ur: NEGATIVE

## 2013-12-08 MED ORDER — KETOROLAC TROMETHAMINE 30 MG/ML IJ SOLN
30.0000 mg | Freq: Once | INTRAMUSCULAR | Status: AC
  Administered 2013-12-08: 30 mg via INTRAVENOUS
  Filled 2013-12-08: qty 1

## 2013-12-08 MED ORDER — HYDROCODONE-ACETAMINOPHEN 5-325 MG PO TABS: 1.0000 | ORAL_TABLET | Freq: Four times a day (QID) | ORAL | Status: DC | PRN

## 2013-12-08 MED ORDER — HYDROMORPHONE HCL PF 1 MG/ML IJ SOLN
1.0000 mg | Freq: Once | INTRAMUSCULAR | Status: AC
  Administered 2013-12-08: 1 mg via INTRAVENOUS
  Filled 2013-12-08: qty 1

## 2013-12-08 NOTE — Discharge Instructions (Signed)
Hydrocodone as prescribed as needed for pain.  Followup with your urologist in the next week. Call on Tuesday to arrange this appointment.   Flank Pain Flank pain refers to pain that is located on the side of the body between the upper abdomen and the back. The pain may occur over a short period of time (acute) or may be long-term or reoccurring (chronic). It may be mild or severe. Flank pain can be caused by many things. CAUSES  Some of the more common causes of flank pain include:  Muscle strains.   Muscle spasms.   A disease of your spine (vertebral disk disease).   A lung infection (pneumonia).   Fluid around your lungs (pulmonary edema).   A kidney infection.   Kidney stones.   A very painful skin rash caused by the chickenpox virus (shingles).   Gallbladder disease.  [place] care will depend on the cause of your pain. In general,  Rest as directed by your caregiver.  Drink enough fluids to keep your urine clear or pale yellow.  Only take over-the-counter or prescription medicines as directed by your caregiver. Some medicines may help relieve the pain.  Tell your caregiver about any changes in your pain.  Follow up with your caregiver as directed. SEEK IMMEDIATE MEDICAL CARE IF:   Your pain is not controlled with medicine.   You have new or worsening symptoms.  Your pain increases.   You have abdominal pain.   You have shortness of breath.   You have persistent nausea or vomiting.   You have swelling in your abdomen.   You feel faint or pass out.   You have blood in your urine.  You have a fever or persistent symptoms for more than 2-3 days.  You have a fever and your symptoms suddenly get worse. MAKE SURE YOU:   Understand these instructions.  Will watch your condition.  Will get help right away if you are not doing well or get worse. Document Released: 05/12/2005 Document Revised: 12/14/2011 Document  Reviewed: 11/03/2011 [facility] Patient Information 2015 [place], [place]. This information is not intended to replace advice given to you by your health care provider. Make sure you discuss any questions you have with your health care provider.  Acute Urinary Retention Acute urinary retention is the temporary inability to urinate. This is an uncommon problem in women. It can be caused by:  Infection.  A side effect of a medicine.  A problem in a nearby organ that presses or squeezes on the bladder or the urethra (the tube that drains the bladder).  Psychological problems.   Surgery on your bladder, urethra, or pelvic organs that causes obstruction to the outflow of urine from your bladder. HOME CARE INSTRUCTIONS  If you are sent home with a Foley catheter and a drainage system, you will need to discuss the best course of action with your health care provider. While the catheter is in, maintain a good intake of fluids. Keep the drainage bag emptied and lower than your catheter. This is so that contaminated urine will not flow back into your bladder, which could lead to a urinary tract infection. There are two main types of drainage bags. One is a large bag that usually is used at night. It has a good capacity that will allow you to sleep through the night without having to empty it. The second type is called a leg bag. It has a smaller capacity so it needs to  be emptied more frequently. However, the main advantage is that it can be attached by a leg strap and goes underneath your clothing, allowing you the freedom to move about or leave your home. Only take over-the-counter or prescription medicines for pain, discomfort, or fever as directed by your health care provider.  SEEK MEDICAL CARE IF:  You develop a low-grade fever.  You experience spasms or leakage of urine with the spasms. SEEK IMMEDIATE MEDICAL CARE IF:   You develop chills or fever.  Your catheter stops draining urine.  Your  catheter falls out.  You start to develop increased bleeding that does not respond to rest and increased fluid intake. MAKE SURE YOU:  Understand these instructions.  Will watch your condition.  Will get help right away if you are not doing well or get worse. Document Released: 03/20/2006 Document Revised: 01/09/2013 Document Reviewed: 08/30/2012 [facility] Patient Information 2015 [place], [place]. This information is not intended to replace advice given to you by your health care provider. Make sure you discuss any questions you have with your health care provider.

## 2013-12-08 NOTE — ED Notes (Signed)
Pt states she is having right flank pain and urinary retention
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
## 2013-12-09 LAB — URINE CULTURE
Colony Count: NO GROWTH
Culture: NO GROWTH

## 2013-12-10 ENCOUNTER — Telehealth: Payer: Self-pay | Admitting: Family Medicine

## 2013-12-10 ENCOUNTER — Telehealth: Payer: Self-pay

## 2013-12-10 NOTE — Telephone Encounter (Signed)
I think she needs to follow up with her primary who sounds like they initiated to follow her bladder complaints

## 2013-12-10 NOTE — Telephone Encounter (Signed)
I spoke with patient and explained Dr. Loetta Rough felt she would be better served by following up with PCP.  She will contact her.

## 2013-12-10 NOTE — Telephone Encounter (Signed)
error 

## 2013-12-10 NOTE — Telephone Encounter (Signed)
Patient c/o urinary frequency. Went 15x yesterday and 8x just this morning.  I was going to recommend office visit but wanted to check with you first as she was seen in ER on 12/08/13 and had urinary retention. Had been catheterized by PCP day before. Urine culture was negative at hospital. Just wanted to be sure you felt OV here appropriate recommendation.

## 2013-12-10 NOTE — Telephone Encounter (Signed)
Aware. 

## 2013-12-10 NOTE — Telephone Encounter (Signed)
Patient Information:  Caller Name: [name]  Phone: [phone]  Patient: [name]  Gender: Female  DOB: December 18, 1965  Age: 48 Years  PCP: Loura Pardon [facility])  Pregnant: No  Office Follow Up:  Does the office need to follow up with this patient?: No  Instructions For The Office: N/A   Symptoms  Reason For Call & Symptoms: Has had frequency with urination since 9-6. Was seen in ED 9-5 due to inability to urinate and was cathed. No infection process. Has slight burning with urination but is not every time she voids. Afebrile.  Reviewed Health History In EMR: Yes  Reviewed Medications In EMR: Yes  Reviewed Allergies In EMR: Yes  Reviewed Surgeries / Procedures: Yes  Date of Onset of Symptoms: 12/08/2013 OB / GYN:  LMP: Unknown  Guideline(s) Used:  Urination Pain - Female  Disposition Per Guideline:   See Today in Office  Reason For Disposition Reached:   Painful urination AND EITHER frequency or urgency  Advice Given:  N/A  Patient Refused Recommendation:  Patient Refused Appt, Patient Requests Appt At Later Date  Cannot be seen 9-8 due to time of day. Scheduled for 9-9 at 1415.

## 2013-12-11 ENCOUNTER — Other Ambulatory Visit: Payer: Self-pay | Admitting: *Deleted

## 2013-12-11 ENCOUNTER — Ambulatory Visit (INDEPENDENT_AMBULATORY_CARE_PROVIDER_SITE_OTHER): Payer: 59 | Admitting: Internal Medicine

## 2013-12-11 ENCOUNTER — Encounter: Payer: Self-pay | Admitting: Internal Medicine

## 2013-12-11 VITALS — BP 122/74 | HR 83 | Temp 98.3°F | Wt 160.0 lb

## 2013-12-11 DIAGNOSIS — R110 Nausea: Secondary | ICD-10-CM

## 2013-12-11 DIAGNOSIS — M549 Dorsalgia, unspecified: Secondary | ICD-10-CM

## 2013-12-11 DIAGNOSIS — N200 Calculus of kidney: Secondary | ICD-10-CM

## 2013-12-11 MED ORDER — TRAMADOL HCL 50 MG PO TABS: 50.0000 mg | ORAL_TABLET | Freq: Three times a day (TID) | ORAL | Status: DC | PRN

## 2013-12-11 MED ORDER — ONDANSETRON HCL 4 MG PO TABS: 4.0000 mg | ORAL_TABLET | Freq: Three times a day (TID) | ORAL | Status: DC | PRN

## 2013-12-11 NOTE — Patient Instructions (Addendum)

## 2013-12-11 NOTE — Addendum Note (Signed)
Addended by: Lurlean Nanny on: 12/11/2013 05:03 PM   Modules accepted: Orders

## 2013-12-11 NOTE — Progress Notes (Signed)
HPI  Pt presents to the clinic today with c/o frequency, urgency and low back pain. She reports this started. She denies fever, chills. She is having some nausea. She did go to the ER for the same on 12/07/13 for the same. She was not able to urinate at that time so that catheterized her for a urine sample. It was normal. CT of the abdomen did show bilateral nonobstructive kidney stones. She was given pain medication and advised to follow up with urology. She reports that she has not yet made a follow up appt with urology.   Review of Systems  Past Medical History  Diagnosis Date  . Anemia     iron deficiency anemia  . Sickle cell trait   . Allergy     allergic rhinitis  . Personal history of kidney stones   . PPD positive     Hx of  . HPV in female     also some abnormal paps in past  . GERD (gastroesophageal reflux disease)   . Hiatal hernia   . Renal lithiasis 2013  . Complication of anesthesia     slow to awaken  . Headache     post concussion  . 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 Cancer Maternal Aunt 54    breast CA  . Cancer Paternal Aunt     uterine CA  . Lupus Cousin   . Anesthesia problems Mother   . Hypertension Mother   . Aneurysm Father   . Colon cancer Neg Hx   . Esophageal cancer Neg Hx   . Stomach cancer Neg Hx   . Rectal cancer Neg Hx     History   Social History  . Marital Status: Married    Spouse Name: [name]    Number of Children: 3  . Years of Education: College   Occupational History  . [place] History Main Topics  . Smoking status: Never Smoker   . Smokeless tobacco: Never Used  . Alcohol Use: No  . Drug Use: No  . Sexual Activity: Yes    Birth Control/ Protection: Surgical   Other Topics Concern  . Not on file   Social History Narrative   Pt lives at home with her spouse.   Caffeine Use: 1-2 cups daily; sodas occasionally    Allergies   Allergen Reactions  . Banana   . Morphine     REACTION: itch    Constitutional: Denies fever, malaise, fatigue, headache or abrupt weight changes.   GU: Pt reports urgency, frequency and low back pain. Denies burning sensation, blood in urine, odor or discharge. Skin: Denies redness, rashes, lesions or ulcercations.   No other specific complaints in a complete review of systems (except as listed in HPI above).    Objective:   Physical Exam  BP 122/74  Pulse 83  Temp(Src) 98.3 F (36.8 C) (Oral)  Wt 160 lb (72.576 kg)  SpO2 99%  LMP 11/24/2010  Wt Readings from Last 3 Encounters:  12/11/13 160 lb (72.576 kg)  12/08/13 160 lb (72.576 kg)  11/13/13 162 lb 8 oz (73.71 kg)    General: Appears her stated age, well developed, well nourished in NAD. Cardiovascular: Normal rate and rhythm. S1,S2 noted.  No murmur, rubs or gallops noted. No JVD or BLE edema. No carotid bruits noted. Pulmonary/Chest: Normal effort and positive vesicular  breath sounds. No respiratory distress. No wheezes, rales or ronchi noted.  Abdomen: Soft. Normal bowel sounds, no bruits noted. No distention or masses noted. Liver, spleen and kidneys non palpable. Tender to palpation over the bladder area. Right CVA tenderness.      Assessment & Plan:   Urgency, Frequency and low back pain with know bilateral kidney stones  Urinalysis: normal eRx for tramadol for pain (norco was too strong per her report) eRx for zofran for nausea Gave her the information to make an appt with her urologist Drink plenty of fluids  RTC as needed or if symptoms persist.

## 2013-12-11 NOTE — Progress Notes (Signed)
Pre visit review using our clinic review tool, if applicable. No additional management support is needed unless otherwise documented below in the visit note. 

## 2013-12-12 LAB — POCT URINALYSIS DIPSTICK
BILIRUBIN UA: NEGATIVE
Blood, UA: NEGATIVE
Glucose, UA: NEGATIVE
KETONES UA: NEGATIVE
LEUKOCYTES UA: NEGATIVE
Nitrite, UA: NEGATIVE
PH UA: 6
Protein, UA: NEGATIVE
SPEC GRAV UA: 1.01
Urobilinogen, UA: NEGATIVE

## 2013-12-13 ENCOUNTER — Other Ambulatory Visit: Payer: Self-pay | Admitting: Gynecology

## 2013-12-13 ENCOUNTER — Ambulatory Visit (INDEPENDENT_AMBULATORY_CARE_PROVIDER_SITE_OTHER): Payer: 59

## 2013-12-13 ENCOUNTER — Ambulatory Visit (INDEPENDENT_AMBULATORY_CARE_PROVIDER_SITE_OTHER): Payer: 59 | Admitting: Gynecology

## 2013-12-13 ENCOUNTER — Telehealth: Payer: Self-pay | Admitting: *Deleted

## 2013-12-13 ENCOUNTER — Encounter: Payer: Self-pay | Admitting: Gynecology

## 2013-12-13 DIAGNOSIS — IMO0002 Reserved for concepts with insufficient information to code with codable children: Secondary | ICD-10-CM

## 2013-12-13 DIAGNOSIS — G8929 Other chronic pain: Secondary | ICD-10-CM

## 2013-12-13 DIAGNOSIS — Z803 Family history of malignant neoplasm of breast: Secondary | ICD-10-CM

## 2013-12-13 DIAGNOSIS — R1031 Right lower quadrant pain: Secondary | ICD-10-CM

## 2013-12-13 NOTE — Patient Instructions (Signed)
Make an appointment to see her GI doctor. Follow up with me afterwards to see how you're doing.

## 2013-12-13 NOTE — Progress Notes (Signed)
[name] 01-29-1966 [id number]        48 y.o.  [id number] Presents for ultrasound due to history of right lower quadrant pain with some radiation to the left lower cluttered. Patient has a history of LAVH 2012, subsequent right salpingo-oophorectomy 05/2012 for pelvic pain which showed normal tube and ovary that were cured to the right pelvic sidewall.  She subsequently was seen in the emergency room having difficulty emptying her bladder with CT scan of the abdomen normal with the exception of nonobstructive renal lithiasis bilaterally.  She is continued to have discomfort in follows up for her already scheduled ultrasound.  Past medical history,surgical history, problem list, medications, allergies, family history and social history were all reviewed and documented in the EPIC chart.  Directed ROS with pertinent positives and negatives documented in the history of present illness/assessment and plan.  Ultrasound shows normal appearing left ovary with negative vaginal cuff. Right adnexa is normal. No cul-de-sac fluid.  Assessment/Plan:  48 y.o. [id number] with persistent right lower quadrant pain with some radiation to the left. Negative CT with the exception of renal lithiasis nonobstructive. She on ultrasound negative. Did have episode urinary retention with a negative urinalysis at the hospital. Reviewed with patient again her possibilities to include pelvic adhesive disease although last evaluation did not show significant adhesions, orthopedic although does not exacerbate with orthopedic-type activities, GI although not having significant diarrhea constipation nausea vomiting and GU although CT does not show obstructive lithiasis. From my standpoint laparoscopy would be the next step in before doing so I prefer her to see her gastroenterologist to see if there is not a GI component. Patient's going to call her gastroenterologist arrange appointment and follow up with me afterwards as needed.   Note:  This document was prepared with digital dictation and possible smart phrase technology. Any transcriptional errors that result from this process are unintentional.   Anastasio Auerbach MD, 3:25 PM 12/13/2013

## 2013-12-13 NOTE — Telephone Encounter (Signed)
Per note on 12/13/13 pt will need to have GI consult , referral will be placed they will contact pt.

## 2013-12-16 ENCOUNTER — Other Ambulatory Visit: Payer: Self-pay | Admitting: *Deleted

## 2013-12-16 DIAGNOSIS — R51 Headache: Secondary | ICD-10-CM

## 2013-12-16 MED ORDER — NAPROXEN 500 MG PO TABS: 500.0000 mg | ORAL_TABLET | Freq: Two times a day (BID) | ORAL | Status: DC | PRN

## 2013-12-19 ENCOUNTER — Encounter: Payer: Self-pay | Admitting: Gastroenterology

## 2013-12-19 ENCOUNTER — Encounter: Payer: Self-pay | Admitting: Nurse Practitioner

## 2013-12-19 NOTE — Telephone Encounter (Signed)
Appointment 12/25/13 @ 9:30 am

## 2013-12-25 ENCOUNTER — Other Ambulatory Visit (INDEPENDENT_AMBULATORY_CARE_PROVIDER_SITE_OTHER): Payer: 59

## 2013-12-25 ENCOUNTER — Encounter: Payer: Self-pay | Admitting: Nurse Practitioner

## 2013-12-25 ENCOUNTER — Ambulatory Visit (INDEPENDENT_AMBULATORY_CARE_PROVIDER_SITE_OTHER): Payer: 59 | Admitting: Nurse Practitioner

## 2013-12-25 VITALS — BP 122/82 | HR 78 | Ht 63.0 in | Wt 161.0 lb

## 2013-12-25 DIAGNOSIS — R110 Nausea: Secondary | ICD-10-CM

## 2013-12-25 DIAGNOSIS — R109 Unspecified abdominal pain: Secondary | ICD-10-CM

## 2013-12-25 LAB — HEPATIC FUNCTION PANEL
ALK PHOS: 60 U/L (ref 39–117)
ALT: 17 U/L (ref 0–35)
AST: 15 U/L (ref 0–37)
Albumin: 4.1 g/dL (ref 3.5–5.2)
Bilirubin, Direct: 0 mg/dL (ref 0.0–0.3)
Total Bilirubin: 0.5 mg/dL (ref 0.2–1.2)
Total Protein: 7.5 g/dL (ref 6.0–8.3)

## 2013-12-25 MED ORDER — OMEPRAZOLE 40 MG PO CPDR: 40.0000 mg | DELAYED_RELEASE_CAPSULE | Freq: Two times a day (BID) | ORAL | Status: DC

## 2013-12-25 MED ORDER — TRAMADOL HCL 50 MG PO TABS: 50.0000 mg | ORAL_TABLET | Freq: Four times a day (QID) | ORAL | Status: DC | PRN

## 2013-12-25 NOTE — Patient Instructions (Addendum)
Your physician has requested that you go to the basement for the following lab work before leaving today: LFTs  Stop Naprosyn and all anti-inflammatory drugs.  Please increase Omeprazole to twice daily, new prescription was sent to your pharmacy.  Ultram was faxed to your pharmacy.  You have a follow up visit scheduled with Tye Savoy, NP for 01-03-2014 at 930 am _________________________________________________________________________________________________

## 2013-12-25 NOTE — Progress Notes (Signed)
History of Present Illness:   Patient is a 48 year old female known to Dr. Fuller Plan. She was evaluated here March 2014 for lower sternum / epigastric pain / globus sensation. Patient was felt to have chest wall pain. For evaluation of globus/GERD symptoms patient underwent EGD which was normal.   Patient here today for a one-month history of postprandial epigastric pain and nausea. Episodes can last for 30 minutes at a time. Patient has been on daily omeprazole since her last visit with [place] but she has also been taking NSAIDS for over a year now. This is not the same pain for which she was seen last year. Patient was recently seen in the emergency department (12/08/13) for flank pain. Noncontrast CT revealed bilateral nonobstructive nephrolithiasis.  CBC pertinent for MCV of 74.8. Hemoglobin normal at 12.7. Patient has chronic microcytosis. Pregnancy test negative.  In addition to epigastric pain and nausea, patient complains of bowel changes. Patient's normal bowel habit is that of a bowel movement about 3 times a week. Lately she's been having bowel movements after every meal though stools are formed. No recent diarrhea. She does have some lower abdominal discomfort during defecation. No blood in stools. No recent medication changes. No significant weight loss   Current Medications, Allergies, Past Medical History, Past Surgical History, Family History and Social History were reviewed in [employer] record.  Studies:   Ct Abdomen Pelvis Wo Contrast  12/08/2013   CLINICAL DATA:  Right flank pain.  Urolithiasis.  EXAM: CT ABDOMEN AND PELVIS WITHOUT CONTRAST  TECHNIQUE: Multidetector CT imaging of the abdomen and pelvis was performed following the standard protocol without IV contrast.  COMPARISON:  06/23/2011  FINDINGS: Kidneys/Urinary tract: Tiny less than 5 mm intrarenal calculi are again seen bilaterally. No evidence of hydronephrosis. No evidence of ureteral calculi or  dilatation. Pelvic phleboliths remain stable. No bladder calculi identified.  Liver: No mass or other abnormality visualized on this non-contrast exam.  Gallbladder/Biliary:  Unremarkable.  Pancreas: No mass or inflammatory process visualized on this non-contrast exam.  Spleen:  Within normal limits in size.  Adrenal Glands:  No mass identified.  Lymph Nodes:  No pathologically enlarged lymph nodes identified.  Pelvic/Reproductive Organs: Prior hysterectomy noted. Adnexal regions are unremarkable in appearance.  Bowel/Peritoneum:  Unremarkable.  Vascular:  No evidence of abdominal aortic aneurysm.  Musculoskeletal:  No suspicious bone lesions identified.  Other:  None.  IMPRESSION: Bilateral nonobstructive nephrolithiasis. No evidence of ureteral calculi, hydronephrosis, or other acute findings.   Electronically Signed   By: Earle Gell M.D.   On: 12/08/2013 01:46   Physical Exam: General: Pleasant, well developed , black female in no acute distress Head: Normocephalic and atraumatic Eyes:  sclerae anicteric, conjunctiva pink  Ears: Normal auditory acuity Lungs: Clear throughout to auscultation Heart: Regular rate and rhythm Abdomen: Soft, non distended, non-tender. No masses, no hepatomegaly. Normal bowel sounds Musculoskeletal: Symmetrical with no gross deformities  Extremities: No edema  Neurological: Alert oriented x 4, grossly nonfocal Psychological:  Alert and cooperative. Normal mood and affect  Assessment and Recommendations: 48 year old female with a one month history of nausea and postprandial epigastric pain radiating through to back. She has taken NSAIDs at least 4 times a week since May 2014. This may be NSAID related gastropathy / PUD. Gallbladder disease is another consideration. Functional component?   LFTs were not done in the emergency department, will check today  Hold all anti-inflammatories. Use Ultram as needed for headaches over  the next 2-3 weeks  Increase omeprazole to  twice daily  If no improvement after holding anti-inflammatories and increasing PPI then patient will need further workup  Return to see me in 2-3 weeks' time, or sooner if needed  Bowel changes. Having increased frequency of formed stools. Advised patient she should call if having diarrhea and stool studies will need to be checked. Doesn't sound infectious. Further workup pending clinical course.

## 2013-12-25 NOTE — Progress Notes (Signed)
Reviewed and agree with management plan.  Karishma Unrein T. Yatziry Deakins, MD FACG 

## 2013-12-31 ENCOUNTER — Telehealth: Payer: Self-pay | Admitting: *Deleted

## 2013-12-31 ENCOUNTER — Telehealth: Payer: Self-pay | Admitting: Neurology

## 2013-12-31 NOTE — Telephone Encounter (Signed)
Pt called requesting to speak to a nurse regarding her meds and current headaches.  C/B [phone]

## 2014-01-01 ENCOUNTER — Telehealth: Payer: Self-pay | Admitting: Nurse Practitioner

## 2014-01-01 NOTE — Telephone Encounter (Signed)
Patient calling to report vomiting today. States she has been having nausea but just started vomiting today. She has taken her Zofran and it did help. Suggested she continue taking Zofran prn. She is scheduled for OV with Tye Savoy, NP on 01/03/14.

## 2014-01-02 ENCOUNTER — Other Ambulatory Visit: Payer: Self-pay | Admitting: *Deleted

## 2014-01-02 MED ORDER — PROPRANOLOL HCL 80 MG PO TABS: 80.0000 mg | ORAL_TABLET | Freq: Two times a day (BID) | ORAL | Status: DC | PRN

## 2014-01-02 NOTE — Telephone Encounter (Signed)
error 

## 2014-01-03 ENCOUNTER — Encounter: Payer: Self-pay | Admitting: Nurse Practitioner

## 2014-01-03 ENCOUNTER — Ambulatory Visit (INDEPENDENT_AMBULATORY_CARE_PROVIDER_SITE_OTHER): Payer: 59 | Admitting: Nurse Practitioner

## 2014-01-03 VITALS — BP 114/74 | HR 64 | Ht 63.0 in | Wt 158.0 lb

## 2014-01-03 DIAGNOSIS — R110 Nausea: Secondary | ICD-10-CM

## 2014-01-03 MED ORDER — ONDANSETRON HCL 4 MG PO TABS
4.0000 mg | ORAL_TABLET | Freq: Three times a day (TID) | ORAL | Status: DC | PRN
Start: 2014-01-03 — End: 2015-05-28

## 2014-01-03 MED ORDER — SUCRALFATE 1 GM/10ML PO SUSP: 1.0000 g | Freq: Two times a day (BID) | ORAL | Status: DC

## 2014-01-03 NOTE — Progress Notes (Signed)
     History of Present Illness:   November is a 48 year old female who I saw several days ago with new onset epigastric pain and nausea. The epigastric pain improved after discontinuation of NSAIDS and increasing PPI to BID but nausea hasn't really improved. She vomits on occasion but mainly just nauseated. Patient endorses constipation with decreased frequency of stools  Current Medications, Allergies, Past Medical History, Past Surgical History, Family History and Social History were reviewed in [employer] record.  Exam: General: Pleasant, well developed , black female in no acute distress Head: Normocephalic and atraumatic Eyes:  sclerae anicteric, conjunctiva pink  Ears: Normal auditory acuity Lungs: Clear throughout to auscultation Heart: Regular rate and rhythm Abdomen: Soft, non distended, non-tender. No masses, no hepatomegaly. Normal bowel sounds Musculoskeletal: Symmetrical with no gross deformities  Extremities: No edema  Neurological: Alert oriented x 4, grossly nonfocal Psychological:  Alert and cooperative. Normal mood and affect  Assessment and Recommendations:  62. 48 year old female with persistent nausea.  This could be NSAID gastropathy but nausea hasn't really mproving off NSAIDS +  high dose PPI.   Will add Carafate  ? nausea from Toprol which was increased to 80 mg two months ago (nausea started about a month llater) Patient will talk to neurology about temporarily decreasing dose back to 40mg  to see if that helps.   Will schedule her for a gastric emptying study to be done in 2-3 weeks, this should give her ample time to see if a reduction in toprol dose make any difference.   Patient wasn't on NSAIDs when she had EGD (for chest pain) in March 2014 but still doubt repeat EGD would be high yield, especially since she is already being treated for possible NSAID induced gastropathy / PUD  2. Constipation, may be from beta blocker started a  month ago. Trial of Miralax daily

## 2014-01-03 NOTE — Patient Instructions (Addendum)
You have been scheduled for a gastric emptying scan at [facility] on 01-20-2014 at 930 am. Please arrive at least 15 minutes prior to your appointment for registration. Please make certain not to have anything to eat or drink after midnight the night before your test. Hold all stomach medications (ex: Zofran, phenergan, Reglan) 48 hours prior to your test. If you need to reschedule your appointment, please contact radiology scheduling at [phone]. _____________________________________________________________________ A gastric-emptying study measures how long it takes for food to move through your stomach. There are several ways to measure stomach emptying. In the most common test, you eat food that contains a small amount of radioactive material. A scanner that detects the movement of the radioactive material is placed over your abdomen to monitor the rate at which food leaves your stomach. This test normally takes about 2 hours to complete. _____________________________________________________________________  We have sent the following medications to your pharmacy for you to pick up at your convenience: Zofran 4 mg  Carafate  Talk to your Neurologist about decreasing your Propranolol to see if that helps with nausea.  If your nausea is improved please call the office back and speak with [place].

## 2014-01-05 NOTE — Progress Notes (Signed)
Reviewed and agree with management plan.  Malcolm T. Stark, MD FACG 

## 2014-01-08 ENCOUNTER — Telehealth: Payer: Self-pay | Admitting: Neurology

## 2014-01-08 ENCOUNTER — Telehealth: Payer: Self-pay | Admitting: *Deleted

## 2014-01-08 NOTE — Telephone Encounter (Signed)
Patient called stating her GI DR would like for her to decrease her headache medication from 80 mg to 40 mg  she has been having severe abdomal pain with vomiting  and they are  getting ready to do a swallowing test

## 2014-01-08 NOTE — Telephone Encounter (Signed)
Pt needs to talk to someone about medication please call [phone]

## 2014-01-14 ENCOUNTER — Ambulatory Visit (HOSPITAL_COMMUNITY)
Admission: RE | Admit: 2014-01-14 | Discharge: 2014-01-14 | Disposition: A | Discharge status: Home / Self Care | Payer: 59 | Source: Ambulatory Visit | Attending: Neurology | Admitting: Neurology

## 2014-01-14 DIAGNOSIS — D573 Sickle-cell trait: Secondary | ICD-10-CM | POA: Insufficient documentation

## 2014-01-14 DIAGNOSIS — Z8489 Family history of other specified conditions: Secondary | ICD-10-CM | POA: Insufficient documentation

## 2014-01-14 DIAGNOSIS — G43909 Migraine, unspecified, not intractable, without status migrainosus: Secondary | ICD-10-CM | POA: Diagnosis not present

## 2014-01-14 MED ORDER — GADOBENATE DIMEGLUMINE 529 MG/ML IV SOLN
15.0000 mL | Freq: Once | INTRAVENOUS | Status: AC | PRN
  Administered 2014-01-14: 15 mL via INTRAVENOUS

## 2014-01-15 ENCOUNTER — Other Ambulatory Visit: Payer: Self-pay | Admitting: *Deleted

## 2014-01-15 ENCOUNTER — Telehealth: Payer: Self-pay | Admitting: *Deleted

## 2014-01-15 DIAGNOSIS — I729 Aneurysm of unspecified site: Secondary | ICD-10-CM

## 2014-01-15 NOTE — Telephone Encounter (Signed)
Patient is aware that MRA of Head is 01/29/14 1:45 pm  [facility]

## 2014-01-17 ENCOUNTER — Other Ambulatory Visit: Payer: Self-pay

## 2014-01-20 ENCOUNTER — Ambulatory Visit (HOSPITAL_COMMUNITY)
Admission: RE | Admit: 2014-01-20 | Discharge: 2014-01-20 | Disposition: A | Discharge status: Home / Self Care | Payer: 59 | Source: Ambulatory Visit | Attending: Nurse Practitioner | Admitting: Nurse Practitioner

## 2014-01-20 DIAGNOSIS — R110 Nausea: Secondary | ICD-10-CM | POA: Insufficient documentation

## 2014-01-20 MED ORDER — TECHNETIUM TC 99M SULFUR COLLOID
2.1000 | Freq: Once | INTRAVENOUS | Status: AC | PRN
  Administered 2014-01-20: 2.1 via INTRAVENOUS

## 2014-01-22 ENCOUNTER — Telehealth: Payer: Self-pay | Admitting: Nurse Practitioner

## 2014-01-22 ENCOUNTER — Ambulatory Visit (INDEPENDENT_AMBULATORY_CARE_PROVIDER_SITE_OTHER): Payer: 59 | Admitting: Family Medicine

## 2014-01-22 ENCOUNTER — Encounter: Payer: Self-pay | Admitting: Family Medicine

## 2014-01-22 ENCOUNTER — Telehealth: Payer: Self-pay

## 2014-01-22 VITALS — BP 110/68 | HR 74 | Temp 98.1°F | Wt 155.5 lb

## 2014-01-22 DIAGNOSIS — J029 Acute pharyngitis, unspecified: Secondary | ICD-10-CM | POA: Insufficient documentation

## 2014-01-22 MED ORDER — MAGIC MOUTHWASH W/LIDOCAINE: 5.0000 mL | Freq: Three times a day (TID) | ORAL | Status: DC | PRN

## 2014-01-22 NOTE — Telephone Encounter (Signed)
Called pharmacy and gave instructed as prescribed below

## 2014-01-22 NOTE — Progress Notes (Signed)
Subjective:    Patient ID: [name], female    DOB: 1965/12/27, 48 y.o.   MRN: [id number]  Sore Throat  Associated symptoms include ear pain. Pertinent negatives include no drooling.  Otalgia  Associated symptoms include a sore throat. Pertinent negatives include no rhinorrhea.    48 yo pleasant female pt of Dr. Glori Bickers, new to me, who presents with two day of worsening sore throat.  Started the morning after she had a gastric emptying study.  At first, she thought it was related to that but pain is worsening.  Hurting so badly that she has not had anything to eat or drink in almost 48 hours.  No fevers. No known sick contacts.  No cough, runny nose, CP or SOB.  Current Outpatient Prescriptions on File Prior to Visit  Medication Sig Dispense Refill  . montelukast (SINGULAIR) 10 MG tablet Take 1 tablet (10 mg total) by mouth at bedtime.  30 tablet  11  . omeprazole (PRILOSEC) 40 MG capsule Take 1 capsule (40 mg total) by mouth 2 (two) times daily.  60 capsule  7  . ondansetron (ZOFRAN) 4 MG tablet Take 1 tablet (4 mg total) by mouth every 8 (eight) hours as needed.  30 tablet  1  . propranolol (INDERAL) 80 MG tablet Take 1 tablet (80 mg total) by mouth 2 (two) times daily as needed (heachache).  180 tablet  1  . sucralfate (CARAFATE) 1 GM/10ML suspension Take 10 mLs (1 g total) by mouth 2 (two) times daily.  420 mL  1  . traMADol (ULTRAM) 50 MG tablet Take 1 tablet (50 mg total) by mouth every 6 (six) hours as needed.  30 tablet  1  . [DISCONTINUED] albuterol (PROAIR HFA) 108 (90 BASE) MCG/ACT inhaler Inhale 2 puffs into the lungs every 4 (four) hours as needed.         No current facility-administered medications on file prior to visit.    Allergies  Allergen Reactions  . Banana   . Morphine     REACTION: itch    Past Medical History  Diagnosis Date  . Anemia     iron deficiency anemia  . Sickle cell trait   . Allergy     allergic rhinitis  . Personal history of  kidney stones   . PPD positive     Hx of  . HPV in female     also some abnormal paps in past  . GERD (gastroesophageal reflux disease)   . Hiatal hernia   . Renal lithiasis 2013  . Complication of anesthesia     slow to awaken  . Headache     post concussion  . Aneurysm 2014    small brain 3 - being watched    Past Surgical History  Procedure Laterality Date  . Breast surgery      removal of L breast mass / benign /age [age]  . Cesarean section      x3  . Tubal ligation      BTL  . Tonsillectomy and adenoidectomy      age 40  . Pelvic laparoscopy      x2 for cysts r/o endometriosis  . Laparoscopic vaginal hysterectomy  12/02/2010    menorrhagia, leiomyomata  . Cystoscopy  2008    L RPG, stone extraction ([place])  . Foot surgery  JULY 2013    RIGHT  . Appendectomy      1990's  . Laparoscopy N/A 05/24/2012  Procedure: LAPAROSCOPY OPERATIVE;  Surgeon: Anastasio Auerbach, MD;  Location: [facility];  Service: Gynecology;  Laterality: N/A;  . Salpingoophorectomy Right 05/24/2012    Procedure: SALPINGO OOPHORECTOMY;  Surgeon: Anastasio Auerbach, MD;  Location: [facility];  Service: Gynecology;  Laterality: Right;  . Esophagogastroduodenoscopy  06/15/2012    Several----normal     Family History  Problem Relation Age of Onset  . Cancer Maternal Aunt 54    breast CA  . Cancer Paternal Aunt     uterine CA  . Lupus Cousin   . Anesthesia problems Mother   . Hypertension Mother   . Aneurysm Father   . Colon cancer Neg Hx   . Esophageal cancer Neg Hx   . Stomach cancer Neg Hx   . Rectal cancer Neg Hx   . Prostate cancer Brother     History   Social History  . Marital Status: Married    Spouse Name: [name]    Number of Children: 3  . Years of Education: College   Occupational History  . [place] History Main Topics  . Smoking status: Never Smoker   . Smokeless tobacco: Never Used  . Alcohol Use: No  . Drug Use: No  . Sexual Activity: Yes     Birth Control/ Protection: Surgical   Other Topics Concern  . Not on file   Social History Narrative   Pt lives at home with her spouse.   Caffeine Use: 1-2 cups daily; sodas occasionally   The PMH, PSH, Social History, Family History, Medications, and allergies have been reviewed in [facility], and have been updated if relevant.   Review of Systems  Constitutional: Negative for fever.  HENT: Positive for ear pain and sore throat. Negative for drooling, postnasal drip, rhinorrhea and sneezing.   Respiratory: Negative.   Cardiovascular: Negative.   All other systems reviewed and are negative.      Objective:   Physical Exam  Nursing note and vitals reviewed. Constitutional: She appears well-developed and well-nourished. No distress.  HENT:  Head: Normocephalic.  Right Ear: Hearing and tympanic membrane normal.  Left Ear: Hearing and tympanic membrane normal.  Mouth/Throat: Uvula is midline. Mucous membranes are dry. Posterior oropharyngeal erythema present. No oropharyngeal exudate.  Skin: Skin is warm, dry and intact.  Psychiatric: She has a normal mood and affect. Her speech is normal.   BP 110/68  Pulse 74  Temp(Src) 98.1 F (36.7 C) (Oral)  Wt 155 lb 8 oz (70.534 kg)  SpO2 99%  LMP 11/24/2010        Assessment & Plan:

## 2014-01-22 NOTE — Patient Instructions (Signed)
Nice to meet you. I am sorry you are not feeling well.  Use Magic mouthwash as needed for next several days. Call [place] with GI.  Call us if you are not getting any better.

## 2014-01-22 NOTE — Progress Notes (Signed)
Pre visit review using our clinic review tool, if applicable. No additional management support is needed unless otherwise documented below in the visit note. 

## 2014-01-22 NOTE — Telephone Encounter (Signed)
[facility] left v/m requesting clarification of magic mouthwash with lidocaine solution; [facility] needs to know exactly what Dr Deborra Medina wants made and ratios. Please advise.

## 2014-01-22 NOTE — Telephone Encounter (Signed)
Spoke with patient see results note.

## 2014-01-22 NOTE — Assessment & Plan Note (Signed)
Likely viral. Rapid strep neg and does not have strep appearance. Unclear if related to motility test.  Advised her to call GI and I will forward this note to them as well. Magic mouthwash prn. Call or return to clinic prn if these symptoms worsen or fail to improve as anticipated. The patient indicates understanding of these issues and agrees with the plan.

## 2014-01-22 NOTE — Telephone Encounter (Signed)
1 part viscous lidocaine 2% : 1 part maalox : 1 part diphenhydramine 12.5 mg

## 2014-01-25 ENCOUNTER — Ambulatory Visit (HOSPITAL_COMMUNITY)
Admission: RE | Admit: 2014-01-25 | Discharge: 2014-01-25 | Disposition: A | Discharge status: Home / Self Care | Payer: 59 | Source: Ambulatory Visit | Attending: Neurology | Admitting: Neurology

## 2014-01-25 DIAGNOSIS — I729 Aneurysm of unspecified site: Secondary | ICD-10-CM

## 2014-01-25 DIAGNOSIS — R51 Headache: Secondary | ICD-10-CM | POA: Diagnosis not present

## 2014-01-27 ENCOUNTER — Other Ambulatory Visit: Payer: Self-pay | Admitting: Nurse Practitioner

## 2014-01-28 ENCOUNTER — Telehealth: Payer: Self-pay | Admitting: Neurology

## 2014-01-28 ENCOUNTER — Telehealth: Payer: Self-pay | Admitting: *Deleted

## 2014-01-28 NOTE — Telephone Encounter (Signed)
Patient is aware of normal MRA  

## 2014-01-28 NOTE — Telephone Encounter (Signed)
Pt left voicemail asking Susie to call her at [phone]. No other details provided / Sherri S.

## 2014-01-28 NOTE — Telephone Encounter (Signed)
Message copied by Claudie Revering on Tue Jan 28, 2014  1:59 PM ------      Message from: [name], [name]      Created: Mon Jan 27, 2014  6:23 AM       MRA of the head reveals no aneurysms      ----- Message -----         From: Rad Results In Interface         Sent: 01/25/2014   4:50 PM           To: Dudley Major, DO                   ------

## 2014-01-29 ENCOUNTER — Ambulatory Visit (HOSPITAL_COMMUNITY): Admission: RE | Admit: 2014-01-29 | Payer: 59 | Source: Ambulatory Visit

## 2014-02-03 ENCOUNTER — Encounter: Payer: Self-pay | Admitting: Family Medicine

## 2014-02-04 ENCOUNTER — Encounter: Payer: Self-pay | Admitting: Neurology

## 2014-02-04 ENCOUNTER — Telehealth: Payer: Self-pay | Admitting: *Deleted

## 2014-02-04 ENCOUNTER — Ambulatory Visit (INDEPENDENT_AMBULATORY_CARE_PROVIDER_SITE_OTHER): Payer: 59 | Admitting: Neurology

## 2014-02-04 ENCOUNTER — Telehealth: Payer: Self-pay | Admitting: Neurology

## 2014-02-04 VITALS — BP 140/62 | HR 64 | Resp 16 | Ht 62.0 in | Wt 158.6 lb

## 2014-02-04 DIAGNOSIS — Z8249 Family history of ischemic heart disease and other diseases of the circulatory system: Secondary | ICD-10-CM

## 2014-02-04 DIAGNOSIS — G43719 Chronic migraine without aura, intractable, without status migrainosus: Secondary | ICD-10-CM

## 2014-02-04 MED ORDER — TOPIRAMATE 25 MG PO TABS: 25.0000 mg | ORAL_TABLET | Freq: Every day | ORAL | Status: DC

## 2014-02-04 MED ORDER — TRAMADOL HCL 50 MG PO TABS: 50.0000 mg | ORAL_TABLET | Freq: Four times a day (QID) | ORAL | Status: DC | PRN

## 2014-02-04 NOTE — Telephone Encounter (Signed)
Patient is aware that both prescriptions have been sent to pharmacy

## 2014-02-04 NOTE — Telephone Encounter (Signed)
Pt was in this morning w/ Dr. Tomi Likens. She has called her pharmacy since being seen and was told we have not sent refills for Ultram and Topamax. Please call pt at confirmed CB# [phone] / Sherri S.
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
## 2014-02-04 NOTE — Telephone Encounter (Signed)
both RX are at pharmacy now the fax has been down

## 2014-02-04 NOTE — Patient Instructions (Signed)
1.  Since you have no symptoms of kidney stones, and the stones on imaging were small and unobstructive, we will start topamax 25mg  daily.  Possible side effects include: impaired thinking, sedation, paresthesias (numbness and tingling) and weight loss.  It may cause dehydration and there is a small risk for kidney stones, so make sure to stay hydrated with water during the day.  There is also a very small risk for glaucoma, so if you notice any change in your vision while taking this medication, see an ophthalmologist.  There is also a very small risk of possible suicidal ideation, as it the case with all antiepileptic medications.  Call in 4 weeks with update. 2.  When you get increased severity of headache, take the tramadol 50mg .  Limit to no more than 2 days out of the week. 3.  No evidence of aneurysm.   4.  Follow up in 3 months.

## 2014-02-05 ENCOUNTER — Encounter: Payer: Self-pay | Admitting: Gastroenterology

## 2014-02-05 ENCOUNTER — Ambulatory Visit (INDEPENDENT_AMBULATORY_CARE_PROVIDER_SITE_OTHER): Payer: 59 | Admitting: Gastroenterology

## 2014-02-05 VITALS — BP 120/76 | HR 64 | Ht 62.5 in | Wt 157.8 lb

## 2014-02-05 DIAGNOSIS — R110 Nausea: Secondary | ICD-10-CM

## 2014-02-05 DIAGNOSIS — K219 Gastro-esophageal reflux disease without esophagitis: Secondary | ICD-10-CM

## 2014-02-05 NOTE — Progress Notes (Signed)
    History of Present Illness: This is a 48 year old female with persistent nausea. She has nausea that is most bothersome in the mornings and generally improves over the course of the day. Inderal was decreased from 80 mg twice a day to 40 mg twice a day without change in nausea. Treatment for GERD has not improved her nausea. Her nausea is not affected by meals. Is not related to bowel movements.  Current Medications, Allergies, Past Medical History, Past Surgical History, Family History and Social History were reviewed in [employer] record.  Physical Exam: General: Well developed , well nourished, no acute distress Head: Normocephalic and atraumatic Eyes:  sclerae anicteric, EOMI Ears: Normal auditory acuity Mouth: No deformity or lesions Lungs: Clear throughout to auscultation Heart: Regular rate and rhythm; no murmurs, rubs or bruits Abdomen: Soft, non tender and non distended. No masses, hepatosplenomegaly or hernias noted. Normal Bowel sounds Musculoskeletal: Symmetrical with no gross deformities  Pulses:  Normal pulses noted Extremities: No clubbing, cyanosis, edema or deformities noted Neurological: Alert oriented x 4, grossly nonfocal Psychological:  Alert and cooperative. Normal mood and affect  Assessment and Recommendations:  1. Nausea. Not meal related and not associated with any other GI symptoms. GES was normal. Symptoms not improved with twice a day PPI therapy and Carafate. Her nausea is likely a medication side effect or neurologic symptom. R/O refractory GERD, esophagitis, gastritis, etc. Schedule EGD. The risks, benefits, and alternatives to endoscopy with possible biopsy and possible dilation were discussed with the patient and they consent to proceed. Continue Zofran as needed. Follow-up with neurology.   2. GERD. Continue omeprazole 40 mg twice daily and standard antireflux measures.

## 2014-02-05 NOTE — Patient Instructions (Signed)
You have been scheduled for an endoscopy  Please follow the written instructions given to you at your visit today. If you use inhalers (even only as needed), please bring them with you on the day of your procedure. Your physician has requested that you go to www.startemmi.com and enter the access code given to you at your visit today. This web site gives a general overview about your procedure. However, you should still follow specific instructions given to you by our office regarding your preparation for the procedure.  Thank you for choosing Dr. [facility]

## 2014-02-12 ENCOUNTER — Ambulatory Visit (AMBULATORY_SURGERY_CENTER): Payer: 59 | Admitting: Gastroenterology

## 2014-02-12 ENCOUNTER — Encounter: Payer: Self-pay | Admitting: Gastroenterology

## 2014-02-12 VITALS — BP 125/70 | HR 68 | Temp 97.7°F | Resp 16 | Ht 62.0 in | Wt 157.0 lb

## 2014-02-12 DIAGNOSIS — R110 Nausea: Secondary | ICD-10-CM

## 2014-02-12 DIAGNOSIS — K2970 Gastritis, unspecified, without bleeding: Secondary | ICD-10-CM

## 2014-02-12 DIAGNOSIS — K2990 Gastroduodenitis, unspecified, without bleeding: Secondary | ICD-10-CM

## 2014-02-12 DIAGNOSIS — B9681 Helicobacter pylori [H. pylori] as the cause of diseases classified elsewhere: Secondary | ICD-10-CM

## 2014-02-12 DIAGNOSIS — K219 Gastro-esophageal reflux disease without esophagitis: Secondary | ICD-10-CM

## 2014-02-12 MED ORDER — SODIUM CHLORIDE 0.9 % IV SOLN: 500.0000 mL | INTRAVENOUS | Status: DC

## 2014-02-12 MED ORDER — FLUCONAZOLE 100 MG PO TABS: 100.0000 mg | ORAL_TABLET | Freq: Every day | ORAL | Status: DC

## 2014-02-12 NOTE — Progress Notes (Signed)
Called to room to assist during endoscopic procedure.  Patient ID and intended procedure confirmed with present staff. Received instructions for my participation in the procedure from the performing physician.  

## 2014-02-12 NOTE — Op Note (Signed)
[place]  [address] [place], [id number]   ENDOSCOPY PROCEDURE REPORT  PATIENT: Cindy, Guzman  MR#: [id number] BIRTHDATE: [birth year] , 48  yrs. old GENDER: female ENDOSCOPIST: Ladene Artist, MD, [facility] PROCEDURE DATE:  02/12/2014 PROCEDURE:  EGD w/ biopsy ASA CLASS:     Class II INDICATIONS:  nausea and history of esophageal reflux. MEDICATIONS: Monitored anesthesia care, Propofol 200 mg IV, and Lidocaine 120 mg IV TOPICAL ANESTHETIC: none DESCRIPTION OF PROCEDURE: After the risks benefits and alternatives of the procedure were thoroughly explained, informed consent was obtained.  The LB [id number] endoscope was introduced through the mouth and advanced to the second portion of the duodenum , Without limitations.  The instrument was slowly withdrawn as the mucosa was fully examined.  ESOPHAGUS: White exudates consistent with candidiasis were found in the mid esophagus and proximal esophagus.   The esophagus was otherwise normal. STOMACH: Moderate gastritis was found in the gastric body.  Multiple biopsies were performed.   The stomach otherwise appeared normal.  DUODENUM: The duodenal mucosa showed no abnormalities.  Retroflexed views revealed no abnormalities.     The scope was then withdrawn from the patient and the procedure completed.  COMPLICATIONS: There were no immediate complications.  ENDOSCOPIC IMPRESSION: 1.   Candidiasis in the mid esophagus and proximal esophagus 2.   Gastritis in the gastric body; multiple biopsies were performed  3.   The EGD otherwise appeared normal  RECOMMENDATIONS: 1.  Await pathology results 2.  Avoid ASA/NSAIDS and follow antireflux measures 3.  Continue PPI daily 4.  Diflucan 100 mg po qd, #14, no refills  eSigned:  Ladene Artist, MD, [facility] 02/12/2014 3:08 PM

## 2014-02-12 NOTE — Progress Notes (Signed)
Report to PACU, RN, vss, BBS= Clear.  

## 2014-02-12 NOTE — Progress Notes (Deleted)
YOU HAD AN ENDOSCOPIC PROCEDURE TODAY AT THE [facility]: Refer to the procedure report that was given to you for any specific questions about what was found during the examination.  If the procedure report does not answer your questions, please call your gastroenterologist to clarify.  If you requested that your care partner not be given the details of your procedure findings, then the procedure report has been included in a sealed envelope for you to review at your convenience later.  YOU SHOULD EXPECT: Some feelings of bloating in the abdomen. Passage of more gas than usual.  Walking can help get rid of the air that was put into your GI tract during the procedure and reduce the bloating. If you had a lower endoscopy (such as a colonoscopy or flexible sigmoidoscopy) you may notice spotting of blood in your stool or on the toilet paper. If you underwent a bowel prep for your procedure, then you may not have a normal bowel movement for a few days.  DIET: Your first meal following the procedure should be a light meal and then it is ok to progress to your normal diet.  A half-sandwich or bowl of soup is an example of a good first meal.  Heavy or fried foods are harder to digest and may make you feel nauseous or bloated.  Likewise meals heavy in dairy and vegetables can cause extra gas to form and this can also increase the bloating.  Drink plenty of fluids but you should avoid alcoholic beverages for 24 hours.  ACTIVITY: Your care partner should take you home directly after the procedure.  You should plan to take it easy, moving slowly for the rest of the day.  You can resume normal activity the day after the procedure however you should NOT DRIVE or use heavy machinery for 24 hours (because of the sedation medicines used during the test).    SYMPTOMS TO REPORT IMMEDIATELY: A gastroenterologist can be reached at any hour.  During normal business hours, 8:30 AM to 5:00 PM Monday through Friday,  call [phone].  After hours and on weekends, please call the GI answering service at [phone] who will take a message and have the physician on call contact you.   Following lower endoscopy (colonoscopy or flexible sigmoidoscopy):  Excessive amounts of blood in the stool  Significant tenderness or worsening of abdominal pains  Swelling of the abdomen that is new, acute  Fever of 100F or higher    FOLLOW UP: If any biopsies were taken you will be contacted by phone or by letter within the next 1-3 weeks.  Call your gastroenterologist if you have not heard about the biopsies in 3 weeks.  Our staff will call the home number listed on your records the next business day following your procedure to check on you and address any questions or concerns that you may have at that time regarding the information given to you following your procedure. This is a courtesy call and so if there is no answer at the home number and we have not heard from you through the emergency physician on call, we will assume that you have returned to your regular daily activities without incident.  SIGNATURES/CONFIDENTIALITY: You and/or your care partner have signed paperwork which will be entered into your electronic medical record.  These signatures attest to the fact that that the information above on your After Visit Summary has been reviewed and is understood.  Full responsibility of the confidentiality   of this discharge information lies with you and/or your care-partner.     

## 2014-02-12 NOTE — Patient Instructions (Addendum)
Discharge instructions given. Handout on gastritis. Avoid NSAIDS and ASA and follow antireflux measures. Prescription sent into pharmacy. Resume previous medications. YOU HAD AN ENDOSCOPIC PROCEDURE TODAY AT [facility]: Refer to the procedure report that was given to you for any specific questions about what was found during the examination.  If the procedure report does not answer your questions, please call your gastroenterologist to clarify.  If you requested that your care partner not be given the details of your procedure findings, then the procedure report has been included in a sealed envelope for you to review at your convenience later.  YOU SHOULD EXPECT: Some feelings of bloating in the abdomen. Passage of more gas than usual.  Walking can help get rid of the air that was put into your GI tract during the procedure and reduce the bloating. If you had a lower endoscopy (such as a colonoscopy or flexible sigmoidoscopy) you may notice spotting of blood in your stool or on the toilet paper. If you underwent a bowel prep for your procedure, then you may not have a normal bowel movement for a few days.  DIET: Your first meal following the procedure should be a light meal and then it is ok to progress to your normal diet.  A half-sandwich or bowl of soup is an example of a good first meal.  Heavy or fried foods are harder to digest and may make you feel nauseous or bloated.  Likewise meals heavy in dairy and vegetables can cause extra gas to form and this can also increase the bloating.  Drink plenty of fluids but you should avoid alcoholic beverages for 24 hours.  ACTIVITY: Your care partner should take you home directly after the procedure.  You should plan to take it easy, moving slowly for the rest of the day.  You can resume normal activity the day after the procedure however you should NOT DRIVE or use heavy machinery for 24 hours (because of the sedation medicines used during  the test).    SYMPTOMS TO REPORT IMMEDIATELY: A gastroenterologist can be reached at any hour.  During normal business hours, 8:30 AM to 5:00 PM Monday through Friday, call [phone].  After hours and on weekends, please call the GI answering service at [phone] who will take a message and have the physician on call contact you.   Following upper endoscopy (EGD)  Vomiting of blood or coffee ground material  New chest pain or pain under the shoulder blades  Painful or persistently difficult swallowing  New shortness of breath  Fever of 100F or higher  Black, tarry-looking stools   FOLLOW UP: If any biopsies were taken you will be contacted by phone or by letter within the next 1-3 weeks.  Call your gastroenterologist if you have not heard about the biopsies in 3 weeks.  Our staff will call the home number listed on your records the next business day following your procedure to check on you and address any questions or concerns that you may have at that time regarding the information given to you following your procedure. This is a courtesy call and so if there is no answer at the home number and we have not heard from you through the emergency physician on call, we will assume that you have returned to your regular daily activities without incident.  SIGNATURES/CONFIDENTIALITY: You and/or your care partner have signed paperwork which will be entered into your electronic medical record.  These signatures attest  to the fact that that the information above on your After Visit Summary has been reviewed and is understood.  Full responsibility of the confidentiality of this discharge information lies with you and/or your care-partner. 

## 2014-02-13 ENCOUNTER — Telehealth: Payer: Self-pay | Admitting: *Deleted

## 2014-02-13 NOTE — Telephone Encounter (Signed)
  Follow up Call-  Call back number 02/12/2014 06/15/2012  Post procedure Call Back phone  # [phone]  Permission to leave phone message Yes Yes     Patient questions:  Do you have a fever, pain , or abdominal swelling? No. Pain Score  0 *  Have you tolerated food without any problems? Yes.    Have you been able to return to your normal activities? Yes.    Do you have any questions about your discharge instructions: Diet   No. Medications  No. Follow up visit  No.  Do you have questions or concerns about your Care? No.  Actions: * If pain score is 4 or above: No action needed, pain <4.

## 2014-02-18 ENCOUNTER — Ambulatory Visit: Payer: 59 | Admitting: Gastroenterology

## 2014-02-19 ENCOUNTER — Other Ambulatory Visit: Payer: Self-pay

## 2014-02-19 MED ORDER — BIS SUBCIT-METRONID-TETRACYC 140-125-125 MG PO CAPS: 3.0000 | ORAL_CAPSULE | Freq: Three times a day (TID) | ORAL | Status: DC

## 2014-03-03 ENCOUNTER — Telehealth: Payer: Self-pay | Admitting: Gastroenterology

## 2014-03-03 NOTE — Telephone Encounter (Signed)
Patient is c/o metallic taste she has from taking Pylera.  She is advised that she should take with meals if at all possible.  She is reassured that normal side effect and I have stressed the importance of finishing the antibiotics

## 2014-03-09 ENCOUNTER — Other Ambulatory Visit: Payer: Self-pay | Admitting: Gynecology

## 2014-04-28 ENCOUNTER — Ambulatory Visit: Payer: Self-pay | Admitting: Family Medicine

## 2014-04-28 ENCOUNTER — Ambulatory Visit (INDEPENDENT_AMBULATORY_CARE_PROVIDER_SITE_OTHER): Payer: 59 | Admitting: Family Medicine

## 2014-04-28 ENCOUNTER — Encounter: Payer: Self-pay | Admitting: Family Medicine

## 2014-04-28 VITALS — BP 90/56 | HR 75 | Temp 98.4°F | Ht 62.5 in | Wt 153.0 lb

## 2014-04-28 DIAGNOSIS — M62838 Other muscle spasm: Secondary | ICD-10-CM

## 2014-04-28 DIAGNOSIS — M542 Cervicalgia: Secondary | ICD-10-CM

## 2014-04-28 DIAGNOSIS — M6248 Contracture of muscle, other site: Secondary | ICD-10-CM

## 2014-04-28 MED ORDER — TIZANIDINE HCL 4 MG PO TABS: 4.0000 mg | ORAL_TABLET | Freq: Every evening | ORAL | Status: DC

## 2014-04-28 MED ORDER — TRAMADOL HCL 50 MG PO TABS: 50.0000 mg | ORAL_TABLET | Freq: Four times a day (QID) | ORAL | Status: DC | PRN

## 2014-04-28 NOTE — Progress Notes (Signed)
Pre visit review using our clinic review tool, if applicable. No additional management support is needed unless otherwise documented below in the visit note. 
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
## 2014-05-06 ENCOUNTER — Ambulatory Visit (INDEPENDENT_AMBULATORY_CARE_PROVIDER_SITE_OTHER): Payer: 59 | Admitting: Neurology

## 2014-05-06 ENCOUNTER — Encounter: Payer: Self-pay | Admitting: Neurology

## 2014-05-06 VITALS — BP 112/78 | HR 68 | Temp 98.4°F | Resp 16 | Ht 62.4 in | Wt 151.6 lb

## 2014-05-06 DIAGNOSIS — G44221 Chronic tension-type headache, intractable: Secondary | ICD-10-CM

## 2014-05-06 DIAGNOSIS — G43019 Migraine without aura, intractable, without status migrainosus: Secondary | ICD-10-CM

## 2014-05-06 MED ORDER — SUMATRIPTAN SUCCINATE 100 MG PO TABS: ORAL_TABLET | ORAL | Status: DC
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
## 2014-05-06 NOTE — Patient Instructions (Signed)
1.  Continue propranolol 80mg  daily 2.  At earliest onset of migraine headache, take sumatriptan 100mg .  May repeat once in 2 hours if needed.   3.  Limit use of pain relievers to no more than 2 days out of the week. 4.  Call in 4 weeks with update.  FOllow up in 3 months.

## 2014-06-06 ENCOUNTER — Encounter: Payer: Self-pay | Admitting: Gynecology

## 2014-06-11 ENCOUNTER — Encounter: Payer: Self-pay | Admitting: Gynecology

## 2014-06-11 ENCOUNTER — Ambulatory Visit (INDEPENDENT_AMBULATORY_CARE_PROVIDER_SITE_OTHER): Payer: 59 | Admitting: Gynecology

## 2014-06-11 ENCOUNTER — Other Ambulatory Visit (HOSPITAL_COMMUNITY)
Admission: RE | Admit: 2014-06-11 | Discharge: 2014-06-11 | Disposition: A | Discharge status: Home / Self Care | Payer: 59 | Source: Ambulatory Visit | Attending: Gynecology | Admitting: Gynecology

## 2014-06-11 ENCOUNTER — Telehealth: Payer: Self-pay | Admitting: *Deleted

## 2014-06-11 VITALS — BP 124/74 | Ht 63.0 in | Wt 148.0 lb

## 2014-06-11 DIAGNOSIS — R2231 Localized swelling, mass and lump, right upper limb: Secondary | ICD-10-CM | POA: Diagnosis not present

## 2014-06-11 DIAGNOSIS — N898 Other specified noninflammatory disorders of vagina: Secondary | ICD-10-CM | POA: Diagnosis not present

## 2014-06-11 DIAGNOSIS — Z01419 Encounter for gynecological examination (general) (routine) without abnormal findings: Secondary | ICD-10-CM | POA: Diagnosis present

## 2014-06-11 DIAGNOSIS — R1031 Right lower quadrant pain: Secondary | ICD-10-CM | POA: Diagnosis not present

## 2014-06-11 DIAGNOSIS — N6310 Unspecified lump in the right breast, unspecified quadrant: Secondary | ICD-10-CM

## 2014-06-11 NOTE — Progress Notes (Signed)
[name] [birth year] [id number]        49 y.o.  [id number] for annual exam.  Several issues noted below.  Past medical history,surgical history, problem list, medications, allergies, family history and social history were all reviewed and documented as reviewed in the EPIC chart.  ROS:  Performed with pertinent positives and negatives included in the history, assessment and plan.   Additional significant findings :  none   Exam: Kim Counsellor Vitals:   06/11/14 0942  BP: 124/74  Height: 5\' 3"  (1.6 m)  Weight: 148 lb (67.132 kg)   General appearance:  Normal affect, orientation and appearance. Skin: Grossly normal HEENT: Without gross lesions.  No cervical or supraclavicular adenopathy. Thyroid normal.  Lungs:  Clear without wheezing, rales or rhonchi Cardiac: RR, without RMG Abdominal:  Soft, nontender, without masses, guarding, rebound, organomegaly or hernia Breasts:  Examined lying and sitting. Left without masses, retractions, discharge or axillary adenopathy.  Right without masses, retractions, discharge. 2 cm axillary fullness tender no overlying skin changes noted Pelvic:  Ext/BUS/vagina normal. Pap smear of cuff done  Adnexa  Without masses or tenderness    Anus and perineum  Normal   Rectovaginal  Normal sphincter tone without palpated masses or tenderness.    Assessment/Plan:  49 y.o. [id number] female for annual exam.   1. Right lower quadrant pain. Patient continues with McBurney's point right lower quadrant intermittent pain. Occurs almost on a daily basis. Usually with movement coughing or sitting up.  Had been treated for H. Pylori by GI and had a GI evaluation. Laparoscopy with right salpingo-oophorectomy 2014 showed some ovarian to sidewall adhesions but no other pathology. Status post appendectomy with pericecal exam without significant adhesions. Upper abdominal exam was also normal on laparoscopy. CT scan in September negative. Ultrasound showed a normal left  ovary with no other pelvic pathology. Reviewed with patient I'm not sure I really have anything else to offer other than repeat laparoscopy with a high possibility of no pathology seen. Possible referral to general surgery but I'm not sure they would offer her anything for it given my negative laparoscopy previously. Patient will think about where she wants to go and if she is interested in proceeding with laparoscopy she will call me. The issue about whether to remove a normal-appearing left ovary while we are there recognizing her pain is on the right was also discussed. 2. Right axillary nodule noticed by patient over the last several weeks. Tender to her touch. No overt evidence of infection boil or cellulitis. She is coming due for mammogram in April. We'll plan diagnostic mammography on the right with right axillary ultrasound. Also planned left routine mammogram. Patient knows my office will help arrange this and will expect a phone call. 3. Vaginal dryness. No discharge irritation or itching but just daily dryness. Also some irritation with intercourse. Will check baseline FSH for hormonal status.  Recommend OTC vaginal moisturizer such as Replens for now. 4. Pap smear 2012. Pap smear of vaginal cuff today. She does have a history of HPV with reportedly abnormal Pap smears years ago but normal since. We will continue to screen based on that history. 5. Health maintenance. No routine blood work done as the patient reports this done at her primary physician's office. Follow up for lab results and breast studies. Follow up if she wants to proceed with laparoscopy.     Anastasio Auerbach MD, 10:15 AM 06/11/2014

## 2014-06-11 NOTE — Patient Instructions (Signed)
Office will call you to arrange for mammogram and ultrasound in evaluation of the lump under your right arm.  If you're interested in proceeding with the laparoscopy let me know  Office will call you with hormone results.  You may obtain a copy of any labs that were done today by logging onto MyChart as outlined in the instructions provided with your AVS (after visit summary). The office will not call with normal lab results but certainly if there are any significant abnormalities then we will contact you.   Health Maintenance, Female A healthy lifestyle and preventative care can promote health and wellness.  Maintain regular health, dental, and eye exams.  Eat a healthy diet. Foods like vegetables, fruits, whole grains, low-fat dairy products, and lean protein foods contain the nutrients you need without too many calories. Decrease your intake of foods high in solid fats, added sugars, and salt. Get information about a proper diet from your caregiver, if necessary.  Regular physical exercise is one of the most important things you can do for your health. Most adults should get at least 150 minutes of moderate-intensity exercise (any activity that increases your heart rate and causes you to sweat) each week. In addition, most adults need muscle-strengthening exercises on 2 or more days a week.   Maintain a healthy weight. The body mass index (BMI) is a screening tool to identify possible weight problems. It provides an estimate of body fat based on height and weight. Your caregiver can help determine your BMI, and can help you achieve or maintain a healthy weight. For adults 20 years and older:  A BMI below 18.5 is considered underweight.  A BMI of 18.5 to 24.9 is normal.  A BMI of 25 to 29.9 is considered overweight.  A BMI of 30 and above is considered obese.  Maintain normal blood lipids and cholesterol by exercising and minimizing your intake of saturated fat. Eat a balanced diet with  plenty of fruits and vegetables. Blood tests for lipids and cholesterol should begin at age 22 and be repeated every 5 years. If your lipid or cholesterol levels are high, you are over 50, or you are a high risk for heart disease, you may need your cholesterol levels checked more frequently.Ongoing high lipid and cholesterol levels should be treated with medicines if diet and exercise are not effective.  If you smoke, find out from your caregiver how to quit. If you do not use tobacco, do not start.  Lung cancer screening is recommended for adults aged 33 80 years who are at high risk for developing lung cancer because of a history of smoking. Yearly low-dose computed tomography (CT) is recommended for people who have at least a 30-pack-year history of smoking and are a current smoker or have quit within the past 15 years. A pack year of smoking is smoking an average of 1 pack of cigarettes a day for 1 year (for example: 1 pack a day for 30 years or 2 packs a day for 15 years). Yearly screening should continue until the smoker has stopped smoking for at least 15 years. Yearly screening should also be stopped for people who develop a health problem that would prevent them from having lung cancer treatment.  If you are pregnant, do not drink alcohol. If you are breastfeeding, be very cautious about drinking alcohol. If you are not pregnant and choose to drink alcohol, do not exceed 1 drink per day. One drink is considered to be 12 ounces (  355 mL) of beer, 5 ounces (148 mL) of wine, or 1.5 ounces (44 mL) of liquor.  Avoid use of street drugs. Do not share needles with anyone. Ask for help if you need support or instructions about stopping the use of drugs.  High blood pressure causes heart disease and increases the risk of stroke. Blood pressure should be checked at least every 1 to 2 years. Ongoing high blood pressure should be treated with medicines, if weight loss and exercise are not effective.  If you  are 55 to 49 years old, ask your caregiver if you should take aspirin to prevent strokes.  Diabetes screening involves taking a blood sample to check your fasting blood sugar level. This should be done once every 3 years, after age 45, if you are within normal weight and without risk factors for diabetes. Testing should be considered at a younger age or be carried out more frequently if you are overweight and have at least 1 risk factor for diabetes.  Breast cancer screening is essential preventative care for women. You should practice "breast self-awareness." This means understanding the normal appearance and feel of your breasts and may include breast self-examination. Any changes detected, no matter how small, should be reported to a caregiver. Women in their 20s and 30s should have a clinical breast exam (CBE) by a caregiver as part of a regular health exam every 1 to 3 years. After age 40, women should have a CBE every year. Starting at age 40, women should consider having a mammogram (breast X-ray) every year. Women who have a family history of breast cancer should talk to their caregiver about genetic screening. Women at a high risk of breast cancer should talk to their caregiver about having an MRI and a mammogram every year.  Breast cancer gene (BRCA)-related cancer risk assessment is recommended for women who have family members with BRCA-related cancers. BRCA-related cancers include breast, ovarian, tubal, and peritoneal cancers. Having family members with these cancers may be associated with an increased risk for harmful changes (mutations) in the breast cancer genes BRCA1 and BRCA2. Results of the assessment will determine the need for genetic counseling and BRCA1 and BRCA2 testing.  The Pap test is a screening test for cervical cancer. Women should have a Pap test starting at age 21. Between ages 21 and 29, Pap tests should be repeated every 2 years. Beginning at age 30, you should have a Pap  test every 3 years as long as the past 3 Pap tests have been normal. If you had a hysterectomy for a problem that was not cancer or a condition that could lead to cancer, then you no longer need Pap tests. If you are between ages 65 and 70, and you have had normal Pap tests going back 10 years, you no longer need Pap tests. If you have had past treatment for cervical cancer or a condition that could lead to cancer, you need Pap tests and screening for cancer for at least 20 years after your treatment. If Pap tests have been discontinued, risk factors (such as a new sexual partner) need to be reassessed to determine if screening should be resumed. Some women have medical problems that increase the chance of getting cervical cancer. In these cases, your caregiver may recommend more frequent screening and Pap tests.  The human papillomavirus (HPV) test is an additional test that may be used for cervical cancer screening. The HPV test looks for the virus that can cause the   cell changes on the cervix. The cells collected during the Pap test can be tested for HPV. The HPV test could be used to screen women aged 67 years and older, and should be used in women of any age who have unclear Pap test results. After the age of 59, women should have HPV testing at the same frequency as a Pap test.  Colorectal cancer can be detected and often prevented. Most routine colorectal cancer screening begins at the age of 83 and continues through age 61. However, your caregiver may recommend screening at an earlier age if you have risk factors for colon cancer. On a yearly basis, your caregiver may provide home test kits to check for hidden blood in the stool. Use of a small camera at the end of a tube, to directly examine the colon (sigmoidoscopy or colonoscopy), can detect the earliest forms of colorectal cancer. Talk to your caregiver about this at age 2, when routine screening begins. Direct examination of the colon should be  repeated every 5 to 10 years through age 88, unless early forms of pre-cancerous polyps or small growths are found.  Hepatitis C blood testing is recommended for all people born from [birth year] through 1965 and any individual with known risks for hepatitis C.  Practice safe sex. Use condoms and avoid high-risk sexual practices to reduce the spread of sexually transmitted infections (STIs). Sexually active women aged 37 and younger should be checked for Chlamydia, which is a common sexually transmitted infection. Older women with new or multiple partners should also be tested for Chlamydia. Testing for other STIs is recommended if you are sexually active and at increased risk.  Osteoporosis is a disease in which the bones lose minerals and strength with aging. This can result in serious bone fractures. The risk of osteoporosis can be identified using a bone density scan. Women ages 58 and over and women at risk for fractures or osteoporosis should discuss screening with their caregivers. Ask your caregiver whether you should be taking a calcium supplement or vitamin D to reduce the rate of osteoporosis.  Menopause can be associated with physical symptoms and risks. Hormone replacement therapy is available to decrease symptoms and risks. You should talk to your caregiver about whether hormone replacement therapy is right for you.  Use sunscreen. Apply sunscreen liberally and repeatedly throughout the day. You should seek shade when your shadow is shorter than you. Protect yourself by wearing long sleeves, pants, a wide-brimmed hat, and sunglasses year round, whenever you are outdoors.  Notify your caregiver of new moles or changes in moles, especially if there is a change in shape or color. Also notify your caregiver if a mole is larger than the size of a pencil eraser.  Stay current with your immunizations. Document Released: 10/04/2010 Document Revised: 07/16/2012 Document Reviewed: 10/04/2010 [facility]  Patient Information 2014 [place].

## 2014-06-11 NOTE — Addendum Note (Signed)
Addended by: Nelva Nay on: 06/11/2014 10:25 AM   Modules accepted: Orders

## 2014-06-11 NOTE — Telephone Encounter (Signed)
-----   Message from Anastasio Auerbach, MD sent at 06/11/2014 10:13 AM EST ----- Arrange for diagnostic mammography right breast and right axillary ultrasound reference right axillary mass. Patient is coming due for routine mammogram and they can go ahead and do the left side also.

## 2014-06-11 NOTE — Telephone Encounter (Signed)
Orders placed at breast center they will contact pt to schedule. 

## 2014-06-12 LAB — URINALYSIS W MICROSCOPIC + REFLEX CULTURE
Bacteria, UA: NONE SEEN
Bilirubin Urine: NEGATIVE
CRYSTALS: NONE SEEN
Casts: NONE SEEN
Glucose, UA: NEGATIVE mg/dL
HGB URINE DIPSTICK: NEGATIVE
KETONES UR: NEGATIVE mg/dL
LEUKOCYTES UA: NEGATIVE
NITRITE: NEGATIVE
PROTEIN: NEGATIVE mg/dL
Specific Gravity, Urine: 1.007 (ref 1.005–1.030)
UROBILINOGEN UA: 0.2 mg/dL (ref 0.0–1.0)
pH: 6 (ref 5.0–8.0)

## 2014-06-12 LAB — FOLLICLE STIMULATING HORMONE: FSH: 4.1 m[IU]/mL

## 2014-06-12 LAB — CYTOLOGY - PAP

## 2014-06-19 ENCOUNTER — Other Ambulatory Visit: Payer: Self-pay | Admitting: Gynecology

## 2014-06-19 DIAGNOSIS — N6310 Unspecified lump in the right breast, unspecified quadrant: Secondary | ICD-10-CM

## 2014-06-23 ENCOUNTER — Other Ambulatory Visit: Payer: Self-pay | Admitting: Gynecology

## 2014-06-23 DIAGNOSIS — N6310 Unspecified lump in the right breast, unspecified quadrant: Secondary | ICD-10-CM

## 2014-06-25 ENCOUNTER — Ambulatory Visit
Admission: RE | Admit: 2014-06-25 | Discharge: 2014-06-25 | Disposition: A | Discharge status: Home / Self Care | Payer: 59 | Source: Ambulatory Visit | Attending: Gynecology | Admitting: Gynecology

## 2014-06-25 DIAGNOSIS — N6310 Unspecified lump in the right breast, unspecified quadrant: Secondary | ICD-10-CM

## 2014-06-26 ENCOUNTER — Telehealth: Payer: Self-pay | Admitting: Gynecology

## 2014-06-26 NOTE — Telephone Encounter (Signed)
Patient informed with the below.  

## 2014-06-26 NOTE — Telephone Encounter (Signed)
Tell patient that I received the mammogram/ultrasound reports. It looks like this small area in her axilla is probably benign but I would feel more comfortable if she would see a general surgeon and let them examine her to make sure they feel comfortable with watching it. Schedule appointment with general surgeon.

## 2014-06-26 NOTE — Telephone Encounter (Signed)
I will call pt next week to schedule appointment with general surgeon.

## 2014-06-26 NOTE — Telephone Encounter (Signed)
Appointment was on 06/25/14 @ 3:00pm

## 2014-07-01 NOTE — Telephone Encounter (Signed)
Appointment on 07/16/14 @ 3:30pm pt informed.

## 2014-07-10 ENCOUNTER — Telehealth: Payer: Self-pay | Admitting: Neurology

## 2014-07-10 NOTE — Telephone Encounter (Signed)
Called patient and notified of advisement.

## 2014-07-10 NOTE — Telephone Encounter (Signed)
If pain is tolerable and improved, and recommend treating low-grade headaches with Tylenol or naproxen combined with Benadryl 25mg , as to avoid overusing the Imitrex.  Margrit Minner K. Posey Pronto, DO

## 2014-07-10 NOTE — Telephone Encounter (Signed)
Pt called wanting to speak to a nurse regarding her meds for Sumatritan  100mg . This is also regarding pt's headaches.  C/b [phone]

## 2014-07-10 NOTE — Telephone Encounter (Signed)
She states that Dr. Tomi Likens told her to call you if she had to use the Imitrex, she is aware that he's out of the office this afternoon & didn't want to wait. She states that she's had to use it twice this week. She did say that headaches have gotten some better but still has some lingering pain. She states usually when she takes the Imitrex the headache goes away.

## 2014-07-16 ENCOUNTER — Ambulatory Visit (INDEPENDENT_AMBULATORY_CARE_PROVIDER_SITE_OTHER): Payer: 59 | Admitting: Family Medicine

## 2014-07-16 ENCOUNTER — Encounter: Payer: Self-pay | Admitting: Family Medicine

## 2014-07-16 VITALS — BP 106/56 | HR 82 | Temp 97.6°F | Ht 63.0 in | Wt 149.2 lb

## 2014-07-16 DIAGNOSIS — M533 Sacrococcygeal disorders, not elsewhere classified: Secondary | ICD-10-CM | POA: Diagnosis not present

## 2014-07-16 DIAGNOSIS — M542 Cervicalgia: Secondary | ICD-10-CM

## 2014-07-16 DIAGNOSIS — G2589 Other specified extrapyramidal and movement disorders: Secondary | ICD-10-CM | POA: Diagnosis not present

## 2014-07-16 MED ORDER — TRAMADOL HCL 50 MG PO TABS: 50.0000 mg | ORAL_TABLET | Freq: Four times a day (QID) | ORAL | Status: DC | PRN

## 2014-07-16 NOTE — Patient Instructions (Signed)
For work:  MOnitors: bring together and raise to eye level  Ergonomic keyboard.   Rests for your wrist on keyboard  Back/Neck: How to set up your workstation   Key Points:  Chair:  An adjustable chair to fit your height with a slight downward tilt of chair seat is helpful.  Should have good high-back support that assists with keeping the natural curves of your spine.  A "lumbar roll" or pillow at your low back can help you keep good posture.  Adjustable arm rests may decrease strain in upper body.   Keyboard:  Should be close and at elbow or just below elbow height.  Split keyboards can help decrease strain on wrist.  Wrist supports can provide mini-breaks to your wrists throughout the day.  Monitor/Screen:  Top of screen should be at eye level or slightly lower.  Good lighting is important to prevent eye strain, or headaches from glare.  Head Posture: A copy holder on either side of the monitor will help limit neck strain.  Head should not be forward.  Ears should line up with you shoulders.  Leg Posture:  Knee and hips should be bent half-way (about a 90 degree angle).  Shorter people may need something to prop their feet on, with knees bent, like a phone book.  Other Stuff: Keep the stuff (phones, pens, phonebooks, etc) that you use frequently close to you, so you're not straining to reach them.

## 2014-07-16 NOTE — Progress Notes (Signed)
Dr. Frederico Hamman T. Refugio Vandevoorde, MD, [facility] [place], [address] Phone: [phone] Fax: [phone]  07/16/2014  Patient: [name], MRN: [id number], DOB: 04-28-1965, 49 y.o.  Primary Physician:  Loura Pardon, MD  Chief Complaint: Shoulder Pain  Subjective:   [name] is a 49 y.o. very pleasant female patient who presents with the following:  Patient here with Ongoing neck and shoulder blade pain.  She is not having any radiculopathy or numbness.  She is working a lot from a Counselling psychologist.  She did not make any of the changes that I previously recommended.  She also has some low back pain now which seems concentrated in the SI joint on the right.  No new traumas or injury.  She has not tried to do any rehabilitation at all she is not physically active currently.  Got a new chair - did go away, unsure if maybe slept wrong. Propping pillows at work.  No numbness or tingling.  Also in the lower back.  3 mo  R si jt  Past Medical History, Surgical History, Social History, Family History, Problem List, Medications, and Allergies have been reviewed and updated if relevant.  GEN: No fevers, chills. Nontoxic. Primarily MSK c/o today. MSK: Detailed in the HPI GI: tolerating PO intake without difficulty Neuro: No numbness, parasthesias, or tingling associated. Otherwise the pertinent positives of the ROS are noted above.   Objective:   BP 106/56 mmHg  Pulse 82  Temp(Src) 97.6 F (36.4 C) (Oral)  Ht 5\' 3"  (1.6 m)  Wt 149 lb 4 oz (67.699 kg)  BMI 26.44 kg/m2  LMP 11/24/2010   GEN: Well-developed,well-nourished,in no acute distress; alert,appropriate and cooperative throughout examination HEENT: Normocephalic and atraumatic without obvious abnormalities. Ears, externally no deformities PULM: Breathing comfortably in no respiratory distress EXT: No clubbing, cyanosis, or edema PSYCH: Normally interactive.  Cooperative during the interview. Pleasant. Friendly and conversant. Not anxious or depressed appearing. Normal, full affect.  CERVICAL SPINE EXAM Range of motion: Flexion, extension, lateral bending, and rotation: modest loss of motion Pain with terminal motion: y, more laterally Spinous Processes: NT SCM: NT Upper paracervical muscles: ttp Upper traps: ttp mid to lower traps C5-T1 intact, sensation and motor   Minimal impingement Forward sloping scapulas and elevationon abd  NT L spine. Full ROM R SI jt ttp  Radiology:  Assessment and Plan:   Cervicalgia - Plan: Ambulatory referral to Physical Therapy  Scapular dyskinesis - Plan: Ambulatory referral to Physical Therapy  Sacroiliac joint dysfunction of right side - Plan: Ambulatory referral to Physical Therapy  I think much of neck, shoulder blade, scapular issues from work station, overuse. Clear dyskinesis. PT for mckenzie protocol, scapular stabilization.  Set up work station better. Refer to the patient instructions sections for details of plan shared with patient.   R SI joint involvement. Began rehab and start more activity.  Follow-up: prn  New Prescriptions   No medications on file   Orders Placed This Encounter  Procedures  . Ambulatory referral to Physical Therapy   Patient Instructions  For work:  MOnitors: bring together and raise to eye level  Ergonomic keyboard.   Rests for your wrist on keyboard  Back/Neck: How to set up your workstation   Key Points:  Chair:  An adjustable chair to fit your height with a slight downward tilt of chair seat is helpful.  Should have good high-back support that assists with keeping  the natural curves of your spine.  A "lumbar roll" or pillow at your low back can help you keep good posture.  Adjustable arm rests may decrease strain in upper body.   Keyboard:  Should be close and at elbow or just below elbow height.  Split keyboards can help decrease strain on  wrist.  Wrist supports can provide mini-breaks to your wrists throughout the day.  Monitor/Screen:  Top of screen should be at eye level or slightly lower.  Good lighting is important to prevent eye strain, or headaches from glare.  Head Posture: A copy holder on either side of the monitor will help limit neck strain.  Head should not be forward.  Ears should line up with you shoulders.  Leg Posture:  Knee and hips should be bent half-way (about a 90 degree angle).  Shorter people may need something to prop their feet on, with knees bent, like a phone book.  Other Stuff: Keep the stuff (phones, pens, phonebooks, etc) that you use frequently close to you, so you're not straining to reach them.     Signed,  Maud Deed. Dorthey Depace, MD   Patient's Medications  New Prescriptions   No medications on file  Previous Medications   OMEPRAZOLE (PRILOSEC) 40 MG CAPSULE    Take 1 capsule (40 mg total) by mouth 2 (two) times daily.   ONDANSETRON (ZOFRAN) 4 MG TABLET    Take 1 tablet (4 mg total) by mouth every 8 (eight) hours as needed.   PROPRANOLOL (INDERAL) 80 MG TABLET    Take 1 tablet (80 mg total) by mouth 2 (two) times daily as needed (heachache).   SUMATRIPTAN (IMITREX) 100 MG TABLET    Take 1 tab at earliest onset of headache.  May repeat in 2 hours if headache persists or recurs.  Do not exceed two tablets in 24 hrs.   TIZANIDINE (ZANAFLEX) 4 MG TABLET    Take 4 mg by mouth 3 (three) times daily.   Modified Medications   Modified Medication Previous Medication   TRAMADOL (ULTRAM) 50 MG TABLET traMADol (ULTRAM) 50 MG tablet      Take 1 tablet (50 mg total) by mouth every 6 (six) hours as needed.    Take 1 tablet (50 mg total) by mouth every 6 (six) hours as needed.  Discontinued Medications   No medications on file

## 2014-07-16 NOTE — Progress Notes (Signed)
Pre visit review using our clinic review tool, if applicable. No additional management support is needed unless otherwise documented below in the visit note. 

## 2014-08-01 ENCOUNTER — Other Ambulatory Visit: Payer: Self-pay | Admitting: Gynecology

## 2014-08-01 DIAGNOSIS — R2231 Localized swelling, mass and lump, right upper limb: Secondary | ICD-10-CM

## 2014-08-05 ENCOUNTER — Other Ambulatory Visit: Payer: Self-pay

## 2014-08-05 ENCOUNTER — Other Ambulatory Visit: Payer: Self-pay | Admitting: Gynecology

## 2014-08-05 DIAGNOSIS — R2231 Localized swelling, mass and lump, right upper limb: Secondary | ICD-10-CM

## 2014-08-08 ENCOUNTER — Ambulatory Visit: Payer: 59 | Admitting: Neurology

## 2014-08-11 ENCOUNTER — Ambulatory Visit
Admission: RE | Admit: 2014-08-11 | Discharge: 2014-08-11 | Disposition: A | Discharge status: Home / Self Care | Payer: 59 | Source: Ambulatory Visit | Attending: Gynecology | Admitting: Gynecology

## 2014-08-11 DIAGNOSIS — N6310 Unspecified lump in the right breast, unspecified quadrant: Secondary | ICD-10-CM

## 2014-08-11 DIAGNOSIS — R2231 Localized swelling, mass and lump, right upper limb: Secondary | ICD-10-CM

## 2014-08-12 ENCOUNTER — Other Ambulatory Visit: Payer: Self-pay | Admitting: Family Medicine

## 2014-08-12 NOTE — Telephone Encounter (Signed)
done

## 2014-08-12 NOTE — Telephone Encounter (Signed)
Please refill times 3 

## 2014-08-12 NOTE — Telephone Encounter (Signed)
Last office visit 07/16/2014.  Last refilled 04/28/2014 for #30 with 2 refills. Ok to refill?

## 2014-09-29 ENCOUNTER — Ambulatory Visit: Payer: 59

## 2014-09-29 ENCOUNTER — Telehealth: Payer: Self-pay | Admitting: *Deleted

## 2014-09-29 MED ORDER — METOCLOPRAMIDE HCL 5 MG/ML IJ SOLN
25.0000 mg | Freq: Once | INTRAVENOUS | Status: AC
  Administered 2014-09-29: 25 mg via INTRAMUSCULAR

## 2014-09-29 MED ORDER — KETOROLAC TROMETHAMINE 60 MG/2ML IM SOLN
60.0000 mg | Freq: Once | INTRAMUSCULAR | Status: AC
  Administered 2014-09-29: 60 mg via INTRAMUSCULAR

## 2014-09-29 MED ORDER — DIPHENHYDRAMINE HCL 50 MG/ML IJ SOLN
25.0000 mg | Freq: Once | INTRAMUSCULAR | Status: AC
  Administered 2014-09-29: 25 mg via INTRAMUSCULAR

## 2014-09-29 NOTE — Telephone Encounter (Signed)
Patient called stating she has had a headache for > that 2 weeks and none of her meds at this time are working she is taking inderal 80mg  bid and Imitrex  As well as ultram  With no relief . She will come in for three combo injection at 145 pm today

## 2014-09-30 ENCOUNTER — Encounter: Payer: Self-pay | Admitting: Neurology

## 2014-09-30 ENCOUNTER — Ambulatory Visit (INDEPENDENT_AMBULATORY_CARE_PROVIDER_SITE_OTHER): Payer: 59 | Admitting: Neurology

## 2014-09-30 VITALS — BP 110/70 | HR 68 | Resp 20 | Ht 63.0 in | Wt 150.5 lb

## 2014-09-30 DIAGNOSIS — G43009 Migraine without aura, not intractable, without status migrainosus: Secondary | ICD-10-CM

## 2014-09-30 DIAGNOSIS — H8110 Benign paroxysmal vertigo, unspecified ear: Secondary | ICD-10-CM

## 2014-09-30 MED ORDER — PROPRANOLOL HCL ER 80 MG PO CP24: 80.0000 mg | ORAL_CAPSULE | Freq: Every day | ORAL | Status: DC

## 2014-09-30 MED ORDER — TRAMADOL HCL 50 MG PO TABS: 50.0000 mg | ORAL_TABLET | Freq: Four times a day (QID) | ORAL | Status: DC | PRN

## 2014-09-30 NOTE — Patient Instructions (Signed)
Switch propranolol to propranolol ER 80mg  daily.  Call in 4 weeks with update Tramadol refilled Follow up in 3 months.
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
## 2014-11-15 ENCOUNTER — Telehealth: Payer: Self-pay | Admitting: Family Medicine

## 2014-11-15 DIAGNOSIS — Z0000 Encounter for general adult medical examination without abnormal findings: Secondary | ICD-10-CM

## 2014-11-15 NOTE — Telephone Encounter (Signed)
-----   Message from Ellamae Sia sent at 11/12/2014  2:35 PM EDT ----- Regarding: Lab orders for Monday, 8.15.16 Patient is scheduled for CPX labs, please order future labs, Thanks , Karna Christmas

## 2014-11-17 ENCOUNTER — Other Ambulatory Visit (INDEPENDENT_AMBULATORY_CARE_PROVIDER_SITE_OTHER): Payer: 59

## 2014-11-17 DIAGNOSIS — Z0000 Encounter for general adult medical examination without abnormal findings: Secondary | ICD-10-CM | POA: Diagnosis not present

## 2014-11-18 LAB — COMPREHENSIVE METABOLIC PANEL
ALT: 12 U/L (ref 0–35)
AST: 15 U/L (ref 0–37)
Albumin: 4.1 g/dL (ref 3.5–5.2)
Alkaline Phosphatase: 58 U/L (ref 39–117)
BILIRUBIN TOTAL: 0.4 mg/dL (ref 0.2–1.2)
BUN: 13 mg/dL (ref 6–23)
CALCIUM: 9.1 mg/dL (ref 8.4–10.5)
CHLORIDE: 106 meq/L (ref 96–112)
CO2: 28 mEq/L (ref 19–32)
CREATININE: 0.84 mg/dL (ref 0.40–1.20)
GFR: 92.71 mL/min (ref >60.00)
GLUCOSE: 86 mg/dL (ref 70–99)
Potassium: 4.2 mEq/L (ref 3.5–5.1)
Sodium: 139 mEq/L (ref 135–145)
Total Protein: 7 g/dL (ref 6.0–8.3)

## 2014-11-18 LAB — TSH: TSH: 1.71 u[IU]/mL (ref 0.35–4.50)

## 2014-11-18 LAB — CBC WITH DIFFERENTIAL/PLATELET
BASOS ABS: 0 10*3/uL (ref 0.0–0.1)
BASOS PCT: 0.4 % (ref 0.0–3.0)
Eosinophils Absolute: 0.2 10*3/uL (ref 0.0–0.7)
Eosinophils Relative: 2.6 % (ref 0.0–5.0)
HEMATOCRIT: 39.5 % (ref 36.0–46.0)
Hemoglobin: 12.8 g/dL (ref 12.0–15.0)
Lymphocytes Relative: 40.7 % (ref 12.0–46.0)
Lymphs Abs: 3.3 10*3/uL (ref 0.7–4.0)
MCHC: 32.4 g/dL (ref 30.0–36.0)
MCV: 81.3 fl (ref 78.0–100.0)
MONO ABS: 0.5 10*3/uL (ref 0.1–1.0)
Monocytes Relative: 6.4 % (ref 3.0–12.0)
NEUTROS ABS: 4.1 10*3/uL (ref 1.4–7.7)
Neutrophils Relative %: 49.9 % (ref 43.0–77.0)
PLATELETS: 317 10*3/uL (ref 150.0–400.0)
RBC: 4.85 Mil/uL (ref 3.87–5.11)
RDW: 14.3 % (ref 11.5–15.5)
WBC: 8.2 10*3/uL (ref 4.0–10.5)

## 2014-11-18 LAB — LIPID PANEL
CHOL/HDL RATIO: 4
Cholesterol: 185 mg/dL (ref 0–200)
HDL: 46.8 mg/dL (ref >39.00)
LDL CALC: 122 mg/dL — AB (ref 0–99)
NONHDL: 138.48
TRIGLYCERIDES: 80 mg/dL (ref 0.0–149.0)
VLDL: 16 mg/dL (ref 0.0–40.0)

## 2014-11-21 ENCOUNTER — Encounter: Payer: Self-pay | Admitting: Family Medicine

## 2014-11-21 ENCOUNTER — Ambulatory Visit (INDEPENDENT_AMBULATORY_CARE_PROVIDER_SITE_OTHER): Payer: 59 | Admitting: Family Medicine

## 2014-11-21 VITALS — BP 128/84 | HR 65 | Temp 98.3°F | Ht 62.5 in | Wt 151.5 lb

## 2014-11-21 DIAGNOSIS — Z0000 Encounter for general adult medical examination without abnormal findings: Secondary | ICD-10-CM | POA: Diagnosis not present

## 2014-11-21 MED ORDER — OMEPRAZOLE 40 MG PO CPDR: 40.0000 mg | DELAYED_RELEASE_CAPSULE | Freq: Every day | ORAL | Status: DC

## 2014-11-21 NOTE — Progress Notes (Signed)
Subjective:    Patient ID: [name], female    DOB: 23-Mar-1966, 49 y.o.   MRN: [id number]  HPI Here for health maintenance exam and to review chronic medical problems    Still struggling with headaches  Worse in the past week -still has one (has to stay out of the sun) Sees neurology  ? If the weather triggered it  No problems for a while before that   Wt is stable with bmi of 27 Trying to take care of herself and get back into the gym   HIV screening-declines/ low risk   Flu vaccine - had one last season and plans to get one in the fall   Mammogram 5/16 (had repeat with [place])- f/u ok with 1 year recall  Self exam- has a lump under arm that is chronic/ fatty tissue- they watch it   Td 4/09  Pap 3/16 with gyn normal Had a hysterectomy  A hot flash every once in a while    Results for orders placed or performed in visit on 11/17/14  CBC with Differential/Platelet  Result Value Ref Range   WBC 8.2 4.0 - 10.5 K/uL   RBC 4.85 3.87 - 5.11 Mil/uL   Hemoglobin 12.8 12.0 - 15.0 g/dL   HCT 39.5 36.0 - 46.0 %   MCV 81.3 78.0 - 100.0 fl   MCHC 32.4 30.0 - 36.0 g/dL   RDW 14.3 11.5 - 15.5 %   Platelets 317.0 150.0 - 400.0 K/uL   Neutrophils Relative % 49.9 43.0 - 77.0 %   Lymphocytes Relative 40.7 12.0 - 46.0 %   Monocytes Relative 6.4 3.0 - 12.0 %   Eosinophils Relative 2.6 0.0 - 5.0 %   Basophils Relative 0.4 0.0 - 3.0 %   Neutro Abs 4.1 1.4 - 7.7 K/uL   Lymphs Abs 3.3 0.7 - 4.0 K/uL   Monocytes Absolute 0.5 0.1 - 1.0 K/uL   Eosinophils Absolute 0.2 0.0 - 0.7 K/uL   Basophils Absolute 0.0 0.0 - 0.1 K/uL  Comprehensive metabolic panel  Result Value Ref Range   Sodium 139 135 - 145 mEq/L   Potassium 4.2 3.5 - 5.1 mEq/L   Chloride 106 96 - 112 mEq/L   CO2 28 19 - 32 mEq/L   Glucose, Bld 86 70 - 99 mg/dL   BUN 13 6 - 23 mg/dL   Creatinine, Ser 0.84 0.40 - 1.20 mg/dL   Total Bilirubin 0.4 0.2 - 1.2 mg/dL   Alkaline Phosphatase 58 39 - 117 U/L   AST 15 0 - 37 U/L   ALT 12 0 - 35 U/L   Total Protein 7.0 6.0 - 8.3 g/dL   Albumin 4.1 3.5 - 5.2 g/dL   Calcium 9.1 8.4 - 10.5 mg/dL   GFR 92.71 >60.00 mL/min  Lipid panel  Result Value Ref Range   Cholesterol 185 0 - 200 mg/dL   Triglycerides 80.0 0.0 - 149.0 mg/dL   HDL 46.80 >39.00 mg/dL   VLDL 16.0 0.0 - 40.0 mg/dL   LDL Cholesterol 122 (H) 0 - 99 mg/dL   Total CHOL/HDL Ratio 4    NonHDL 138.48   TSH  Result Value Ref Range   TSH 1.71 0.35 - 4.50 uIU/mL    Cholesterol is in the zone - LDL is under 130  She bakes foods and avoids fried foods  (her son has blood sugar issues- whole family is eating healthier)   Patient Active Problem List   Diagnosis Date Noted  .  Routine general medical examination at a health care facility 11/15/2014  . BPPV (benign paroxysmal positional vertigo) 09/30/2014  . Acute pharyngitis 01/22/2014  . Abdominal pain, unspecified site 12/25/2013  . Nausea alone 12/25/2013  . Cough 07/29/2013  . 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 Family history of brain aneurysm 01/07/2013  . Eczema 12/21/2012  . Headache(784.0) 08/21/2012  . Breast mass, left 07/05/2012  . ALLERGIC RHINITIS 03/20/2009  . GERD 08/20/2008  . RENAL CALCULUS, HX OF 08/20/2008  . ANEMIA, IRON DEFICIENCY 03/18/2008  . POSITIVE PPD 10/02/2007   Past Medical History  Diagnosis Date  . Anemia     iron deficiency anemia  . Sickle cell trait   . Allergy     allergic rhinitis  . Personal history of kidney stones   . PPD positive     Hx of  . HPV in female     also some abnormal paps in past  . GERD (gastroesophageal reflux disease)   . Hiatal hernia   . Renal lithiasis 2013  . Complication of anesthesia     slow to awaken  . Headache     post concussion  . 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 Breast surgery      removal of L breast mass / benign /age [age]  . Cesarean section      x3  . Tubal ligation      BTL  . Tonsillectomy and  adenoidectomy      age 50  . Pelvic laparoscopy      x2 for cysts r/o endometriosis  . Laparoscopic vaginal hysterectomy  12/02/2010    menorrhagia, leiomyomata  . Cystoscopy  2008    L RPG, stone extraction ([place])  . Foot surgery  JULY 2013    RIGHT  . Appendectomy      1990's  . Laparoscopy N/A 05/24/2012    Procedure: LAPAROSCOPY OPERATIVE;  Surgeon: Anastasio Auerbach, MD;  Location: [facility];  Service: Gynecology;  Laterality: N/A;  . Salpingoophorectomy Right 05/24/2012    Procedure: SALPINGO OOPHORECTOMY;  Surgeon: Anastasio Auerbach, MD;  Location: [facility];  Service: Gynecology;  Laterality: Right;  . Esophagogastroduodenoscopy  06/15/2012    Several----normal    Social History  Substance Use Topics  . Smoking status: Never Smoker   . Smokeless tobacco: Never Used  . Alcohol Use: No   Family History  Problem Relation Age of Onset  . Cancer Maternal Aunt 54    breast CA  . Cancer Paternal Aunt     uterine CA  . Lupus Cousin   . Anesthesia problems Mother   . Hypertension Mother   . Aneurysm Father   . Colon cancer Neg Hx   . Esophageal cancer Neg Hx   . Stomach cancer Neg Hx   . Rectal cancer Neg Hx   . Prostate cancer Brother    Allergies  Allergen Reactions  . Banana     Throat itches  . Morphine     REACTION: itch   Current Outpatient Prescriptions on File Prior to Visit  Medication Sig Dispense Refill  . omeprazole (PRILOSEC) 40 MG capsule Take 1 capsule (40 mg total) by mouth 2 (two) times daily. 60 capsule 7  . propranolol ER (INDERAL LA) 80 MG 24 hr capsule Take 1 capsule (80 mg total) by mouth daily. 30 capsule 1  . SUMAtriptan (IMITREX) 100 MG tablet Take 1 tab  at earliest onset of headache.  May repeat in 2 hours if headache persists or recurs.  Do not exceed two tablets in 24 hrs. 9 tablet 2  . tiZANidine (ZANAFLEX) 4 MG tablet TAKE 1 TABLET BY MOUTH NIGHTLY 30 tablet 2  . traMADol (ULTRAM) 50 MG tablet Take 1 tablet (50 mg total) by mouth every 6  (six) hours as needed. 60 tablet 3  . ondansetron (ZOFRAN) 4 MG tablet Take 1 tablet (4 mg total) by mouth every 8 (eight) hours as needed. (Patient not taking: Reported on 11/21/2014) 30 tablet 1  . [DISCONTINUED] albuterol (PROAIR HFA) 108 (90 BASE) MCG/ACT inhaler Inhale 2 puffs into the lungs every 4 (four) hours as needed.       No current facility-administered medications on file prior to visit.     Review of Systems Review of Systems  Constitutional: Negative for fever, appetite change, fatigue and unexpected weight change.  Eyes: Negative for pain and visual disturbance.  Respiratory: Negative for cough and shortness of breath.   Cardiovascular: Negative for cp or palpitations    Gastrointestinal: Negative for nausea, diarrhea and constipation.  Genitourinary: Negative for urgency and frequency.  Skin: Negative for pallor or rash   MSK pos for occ lateral R shin pain after exercise / not now  Neurological: Negative for weakness, light-headedness, numbness and headaches.  Hematological: Negative for adenopathy. Does not bruise/bleed easily.  Psychiatric/Behavioral: Negative for dysphoric mood. The patient is not nervous/anxious.         Objective:   Physical Exam  Constitutional: She appears well-developed and well-nourished. No distress.  Well appearing   HENT:  Head: Normocephalic and atraumatic.  Right Ear: External ear normal.  Left Ear: External ear normal.  Mouth/Throat: Oropharynx is clear and moist.  Eyes: Conjunctivae and EOM are normal. Pupils are equal, round, and reactive to light. No scleral icterus.  Neck: Normal range of motion. Neck supple. No JVD present. Carotid bruit is not present. No thyromegaly present.  Cardiovascular: Normal rate, regular rhythm, normal heart sounds and intact distal pulses.  Exam reveals no gallop.   Pulmonary/Chest: Effort normal and breath sounds normal. No respiratory distress. She has no wheezes. She exhibits no tenderness.    Abdominal: Soft. Bowel sounds are normal. She exhibits no distension, no abdominal bruit and no mass. There is no tenderness.  Genitourinary: No breast swelling, tenderness, discharge or bleeding.  Breast exam: No mass, nodules, thickening, tenderness, bulging, retraction, inflamation, nipple discharge or skin changes noted.  No axillary or clavicular LA.     Dense breasts noted More fatty tissue in R axilla -non tender   Musculoskeletal: Normal range of motion. She exhibits no edema or tenderness.  Lymphadenopathy:    She has no cervical adenopathy.  Neurological: She is alert. She has normal reflexes. No cranial nerve deficit. She exhibits normal muscle tone. Coordination normal.  Skin: Skin is warm and dry. No rash noted. No erythema. No pallor.  Psychiatric: She has a normal mood and affect.          Assessment & Plan:   Problem List Items Addressed This Visit    Routine general medical examination at a health care facility - Primary    Reviewed health habits including diet and exercise and skin cancer prevention Reviewed appropriate screening tests for age  Also reviewed health mt list, fam hx and immunization status , as well as social and family history   See HPI Labs reviewed LDL chol 122 - not bad  but may aim for better with diet (low sat fat diet rev)  Will get flu shot in the fall Declines HIV screening since she is low risk

## 2014-11-21 NOTE — Assessment & Plan Note (Signed)
Reviewed health habits including diet and exercise and skin cancer prevention Reviewed appropriate screening tests for age  Also reviewed health mt list, fam hx and immunization status , as well as social and family history   See HPI Labs reviewed LDL chol 122 - not bad but may aim for better with diet (low sat fat diet rev)  Will get flu shot in the fall Declines HIV screening since she is low risk

## 2014-11-21 NOTE — Patient Instructions (Signed)
For cholesterol (Avoid red meat/ fried foods/ egg yolks/ fatty breakfast meats/ butter, cheese and high fat dairy/ and shellfish)  It sounds like your diet is pretty good  Get back to the gym  Don't forget to get a flu shot in the fall

## 2014-11-21 NOTE — Progress Notes (Signed)
Pre visit review using our clinic review tool, if applicable. No additional management support is needed unless otherwise documented below in the visit note. 

## 2014-12-10 ENCOUNTER — Other Ambulatory Visit: Payer: Self-pay | Admitting: Neurology

## 2014-12-10 NOTE — Telephone Encounter (Signed)
Rx sent 

## 2014-12-26 ENCOUNTER — Telehealth: Payer: Self-pay | Admitting: Family Medicine

## 2014-12-26 NOTE — Telephone Encounter (Signed)
Pt dropped off health screening form to be completed. Pt is requesting form be faxed in when done. Thank you.

## 2014-12-29 DIAGNOSIS — Z7689 Persons encountering health services in other specified circumstances: Secondary | ICD-10-CM

## 2014-12-29 NOTE — Telephone Encounter (Signed)
Done and in IN box 

## 2014-12-29 NOTE — Telephone Encounter (Signed)
Form faxed and pt notified and advise of the fee, original form mailed to pt and copy sent to scanning

## 2014-12-29 NOTE — Telephone Encounter (Signed)
Form in your inbox 

## 2015-01-14 ENCOUNTER — Ambulatory Visit (INDEPENDENT_AMBULATORY_CARE_PROVIDER_SITE_OTHER): Payer: 59 | Admitting: Neurology

## 2015-01-14 ENCOUNTER — Encounter: Payer: Self-pay | Admitting: Neurology

## 2015-01-14 VITALS — BP 120/78 | HR 80 | Wt 154.2 lb

## 2015-01-14 DIAGNOSIS — G43009 Migraine without aura, not intractable, without status migrainosus: Secondary | ICD-10-CM | POA: Diagnosis not present

## 2015-01-14 DIAGNOSIS — M542 Cervicalgia: Secondary | ICD-10-CM | POA: Diagnosis not present

## 2015-01-14 MED ORDER — PROPRANOLOL HCL ER 120 MG PO CP24: 120.0000 mg | ORAL_CAPSULE | Freq: Every day | ORAL | Status: DC

## 2015-01-14 MED ORDER — TRAMADOL HCL 50 MG PO TABS: 50.0000 mg | ORAL_TABLET | Freq: Four times a day (QID) | ORAL | Status: DC | PRN
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
## 2015-01-14 NOTE — Patient Instructions (Signed)
Increase propranolol ER to 120mg  daily Ultram has been refilled Call in 4 weeks with update Follow up in 4 months.

## 2015-03-02 ENCOUNTER — Encounter (HOSPITAL_COMMUNITY): Payer: Self-pay | Admitting: Emergency Medicine

## 2015-03-02 ENCOUNTER — Emergency Department (HOSPITAL_COMMUNITY)
Admission: EM | Admit: 2015-03-02 | Discharge: 2015-03-02 | Disposition: A | Discharge status: Home / Self Care | Payer: 59 | Attending: Emergency Medicine | Admitting: Emergency Medicine

## 2015-03-02 DIAGNOSIS — Z87442 Personal history of urinary calculi: Secondary | ICD-10-CM | POA: Diagnosis not present

## 2015-03-02 DIAGNOSIS — Z8679 Personal history of other diseases of the circulatory system: Secondary | ICD-10-CM | POA: Insufficient documentation

## 2015-03-02 DIAGNOSIS — Z8619 Personal history of other infectious and parasitic diseases: Secondary | ICD-10-CM | POA: Insufficient documentation

## 2015-03-02 DIAGNOSIS — Z862 Personal history of diseases of the blood and blood-forming organs and certain disorders involving the immune mechanism: Secondary | ICD-10-CM | POA: Diagnosis not present

## 2015-03-02 DIAGNOSIS — K219 Gastro-esophageal reflux disease without esophagitis: Secondary | ICD-10-CM | POA: Diagnosis not present

## 2015-03-02 DIAGNOSIS — G43909 Migraine, unspecified, not intractable, without status migrainosus: Secondary | ICD-10-CM | POA: Insufficient documentation

## 2015-03-02 DIAGNOSIS — Z79899 Other long term (current) drug therapy: Secondary | ICD-10-CM | POA: Diagnosis not present

## 2015-03-02 MED ORDER — DIPHENHYDRAMINE HCL 50 MG/ML IJ SOLN
25.0000 mg | Freq: Once | INTRAMUSCULAR | Status: AC
  Administered 2015-03-02: 25 mg via INTRAMUSCULAR
  Filled 2015-03-02: qty 1

## 2015-03-02 MED ORDER — KETOROLAC TROMETHAMINE 60 MG/2ML IM SOLN
60.0000 mg | Freq: Once | INTRAMUSCULAR | Status: AC
  Administered 2015-03-02: 60 mg via INTRAMUSCULAR
  Filled 2015-03-02: qty 2

## 2015-03-02 MED ORDER — METOCLOPRAMIDE HCL 10 MG PO TABS
20.0000 mg | ORAL_TABLET | Freq: Once | ORAL | Status: AC
  Administered 2015-03-02: 20 mg via ORAL
  Filled 2015-03-02: qty 2

## 2015-03-02 NOTE — Discharge Instructions (Signed)
1. Medications: continue usual home medications 2. Treatment: rest, drink plenty of fluids 3. Follow Up: Please followup with your primary doctor in 3 days and neurology within 1 week for discussion of your diagnoses and further evaluation after today's visit; Please return to the ER for double vision, speech difficulty, gait disturbance, persistent vomiting or other concerns.

## 2015-03-02 NOTE — ED Notes (Signed)
INITIAL ASSESSMENT COMPLETED. PT C/O A MIGRAINE SINCE THIS AM. PT STATES SHE TOOK HER PRESCRIBED MEDICATIONS WITH NO RELIEF. AWAITING FURTHER ORDERS.

## 2015-03-02 NOTE — ED Notes (Signed)
Patient presents for migraine symptoms starting earlier today, sensitivity to light and sound, light headedness, emesis x2, bilateral blurred vision.

## 2015-03-02 NOTE — ED Provider Notes (Signed)
CSN: [id number]     Arrival date & time 03/02/15  1841 History   First MD Initiated Contact with Patient 03/02/15 2212     Chief Complaint  Patient presents with  . Migraine     (Consider location/radiation/quality/duration/timing/severity/associated sxs/prior Treatment) Patient is a 49 y.o. female presenting with migraines. The history is provided by the patient and medical records. No language interpreter was used.  Migraine Associated symptoms include headaches, nausea and vomiting. Pertinent negatives include no abdominal pain, arthralgias, congestion, coughing, myalgias, neck pain, rash, sore throat or weakness.   [name] is a 49 y.o. female  with a PMH of sickle cell and migraines followed by neurology who presents to the Emergency Department complaining of worsening, persistent headache x 1 day. Pt. States pain began this morning and has progressively worsened throughout the day. Pattern and distribution is similar to normal migraines. Associated symptoms include photophobia, nausea, and two episodes of emesis. Pt. Took her home medications of imitrex, ultram, naproxen - along with usual propranolol which provided no relief. No alleviating factors noted.   Past Medical History  Diagnosis Date  . Anemia     iron deficiency anemia  . Sickle cell trait ([place])   . Allergy     allergic rhinitis  . Personal history of kidney stones   . PPD positive     Hx of  . HPV in female     also some abnormal paps in past  . GERD (gastroesophageal reflux disease)   . Hiatal hernia   . Renal lithiasis 2013  . Complication of anesthesia     slow to awaken  . Headache     post concussion  . 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 Breast surgery      removal of L breast mass / benign /age [age]  . Cesarean section      x3  . Tubal ligation      BTL  . Tonsillectomy and adenoidectomy      age 66  . Pelvic  laparoscopy      x2 for cysts r/o endometriosis  . Laparoscopic vaginal hysterectomy  12/02/2010    menorrhagia, leiomyomata  . Cystoscopy  2008    L RPG, stone extraction ([place])  . Foot surgery  JULY 2013    RIGHT  . Appendectomy      1990's  . Laparoscopy N/A 05/24/2012    Procedure: LAPAROSCOPY OPERATIVE;  Surgeon: Anastasio Auerbach, MD;  Location: [facility];  Service: Gynecology;  Laterality: N/A;  . Salpingoophorectomy Right 05/24/2012    Procedure: SALPINGO OOPHORECTOMY;  Surgeon: Anastasio Auerbach, MD;  Location: [facility];  Service: Gynecology;  Laterality: Right;  . Esophagogastroduodenoscopy  06/15/2012    Several----normal    Family History  Problem Relation Age of Onset  . Cancer Maternal Aunt 54    breast CA  . Cancer Paternal Aunt     uterine CA  . Lupus Cousin   . Anesthesia problems Mother   . Hypertension Mother   . Aneurysm Father   . Colon cancer Neg Hx   . Esophageal cancer Neg Hx   . Stomach cancer Neg Hx   . Rectal cancer Neg Hx   . Prostate cancer Brother    Social History  Substance Use Topics  . Smoking status: Never Smoker   . Smokeless tobacco: Never Used  . Alcohol Use: No  OB History    Gravida Para Term Preterm AB TAB SAB Ectopic Multiple Living   3 3 2 1      3      Review of Systems  Constitutional: Negative.   HENT: Negative for congestion, rhinorrhea and sore throat.   Eyes: Positive for photophobia.  Respiratory: Negative for cough, shortness of breath and wheezing.   Cardiovascular: Negative.   Gastrointestinal: Positive for nausea and vomiting. Negative for abdominal pain, diarrhea and constipation.  Genitourinary: Negative for dysuria.  Musculoskeletal: Negative for myalgias, back pain, arthralgias and neck pain.  Skin: Negative for rash.  Neurological: Positive for dizziness and headaches. Negative for syncope, speech difficulty and weakness.      Allergies  Banana and Morphine  Home Medications   Prior to Admission  medications   Medication Sig Start Date End Date Taking? Authorizing Provider  naproxen (NAPROSYN) 250 MG tablet Take 250 mg by mouth 2 (two) times daily as needed for headache.   Yes Historical Provider, MD  omeprazole (PRILOSEC) 40 MG capsule Take 1 capsule (40 mg total) by mouth daily. 11/21/14  Yes Abner Greenspan, MD  propranolol ER (INDERAL LA) 120 MG 24 hr capsule Take 1 capsule (120 mg total) by mouth daily. 01/14/15  Yes Adam Telford Nab, DO  traMADol (ULTRAM) 50 MG tablet Take 1 tablet (50 mg total) by mouth every 6 (six) hours as needed. 01/14/15  Yes Adam Telford Nab, DO  ondansetron (ZOFRAN) 4 MG tablet Take 1 tablet (4 mg total) by mouth every 8 (eight) hours as needed. 01/03/14   Willia Craze, NP  SUMAtriptan (IMITREX) 100 MG tablet Take 1 tab at earliest onset of headache.  May repeat in 2 hours if headache persists or recurs.  Do not exceed two tablets in 24 hrs. 05/06/14   Pieter Partridge, DO  tiZANidine (ZANAFLEX) 4 MG tablet TAKE 1 TABLET BY MOUTH NIGHTLY Patient not taking: Reported on 03/02/2015 08/12/14   Abner Greenspan, MD   BP 119/69 mmHg  Pulse 72  Temp(Src) 98.1 F (36.7 C) (Oral)  Resp 16  SpO2 100%  LMP 11/24/2010 Physical Exam  Constitutional: She is oriented to person, place, and time. She appears well-developed and well-nourished. No distress.  HENT:  Head: Normocephalic and atraumatic.  Mouth/Throat: Oropharynx is clear and moist.  No tenderness of the temporal arteries  Eyes: Conjunctivae and EOM are normal. Pupils are equal, round, and reactive to light. No scleral icterus.  No nystagmus   Neck: Normal range of motion. Neck supple.  Full active and passive ROM; pain with neck extension.  No midline tenderness; mild TTP of paraspinal musculature No nuchal rigidity or meningeal signs.  Cardiovascular: Normal rate, regular rhythm, normal heart sounds and intact distal pulses.   Pulmonary/Chest: Effort normal and breath sounds normal. No respiratory distress. She has no  wheezes. She has no rales.  Abdominal: Soft. Bowel sounds are normal. There is no tenderness. There is no rebound and no guarding.  Musculoskeletal: Normal range of motion.  Lymphadenopathy:    She has no cervical adenopathy.  Neurological: She is alert and oriented to person, place, and time. She has normal reflexes. No cranial nerve deficit. Coordination normal.  Mental Status: Alert, oriented, and thought content is appropriate. Speech is fluent without evidence of aphasia. Able to follow two-step commands without difficulty.   Cranial Nerves:  II - Peripheral visual fields grossly normal, pupils equal, round, reactive to light III, IV, VI - Bilateral EOM intact, no  ptosis V - Facial light touch sensation intact and equal VII - Facial symmetry: smile, raised eyebrows ; Eyelids kept closed against resistance VIII - Hearing grossly normal bilaterally  IX, X - Uvula midline XI - Bilateral shoulder shrug equal and strong XII - Tongue extension midline  Motor:  5/5 muscle strength of upper and lower extremities bilaterally including strong and equal grip strength and plantar/dorsiflexion.  Sensory:  Light touch sensory intact.    Skin: Skin is warm and dry. No rash noted. She is not diaphoretic.  Psychiatric: She has a normal mood and affect. Her behavior is normal. Judgment and thought content normal.  Nursing note and vitals reviewed.   ED Course  Procedures (including critical care time) Labs Review Labs Reviewed - No data to display  Imaging Review No results found. I have personally reviewed and evaluated these images and lab results as part of my medical decision-making.   EKG Interpretation None      MDM   Final diagnoses:  Migraine without status migrainosus, not intractable, unspecified migraine type   [name] presents with headache c/w previous migraines - she is followed by neurology and their notes were reviewed. Reglan, benadryl, and toradol are usually  given for these sxs when seen by neurologist and will be given here.   11:45 PM - Patient re-evaluated and states she is improved and ready to go home. Stressed importance of neurology follow-up to discuss today's diagnosis. Patient dc'd to home in good and stable condition.   [facility] Ward, PA-C 03/02/15 2346  Charlesetta Shanks, MD 03/04/15 [id number]

## 2015-03-10 ENCOUNTER — Encounter: Payer: Self-pay | Admitting: Neurology

## 2015-03-10 ENCOUNTER — Ambulatory Visit (INDEPENDENT_AMBULATORY_CARE_PROVIDER_SITE_OTHER): Payer: 59 | Admitting: Neurology

## 2015-03-10 ENCOUNTER — Telehealth: Payer: Self-pay

## 2015-03-10 VITALS — BP 126/72 | HR 70 | Ht 62.5 in | Wt 151.7 lb

## 2015-03-10 DIAGNOSIS — G43009 Migraine without aura, not intractable, without status migrainosus: Secondary | ICD-10-CM

## 2015-03-10 DIAGNOSIS — M542 Cervicalgia: Secondary | ICD-10-CM | POA: Diagnosis not present

## 2015-03-10 DIAGNOSIS — G8929 Other chronic pain: Secondary | ICD-10-CM | POA: Insufficient documentation

## 2015-03-10 MED ORDER — DIAZEPAM 5 MG PO TABS: ORAL_TABLET | ORAL | Status: DC

## 2015-03-10 MED ORDER — SUMATRIPTAN SUCCINATE 6 MG/0.5ML ~~LOC~~ SOLN: SUBCUTANEOUS | Status: DC
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
## 2015-03-10 NOTE — Patient Instructions (Signed)
1.  Continue sumatriptan 100mg  as directed for headaches. 2.  However, if you wake up with a migraine, use sumatriptan 6mg  injection.  Inject into skin.  May repeat dose once in 2 hours if needed.  Do not exceed two injections in 24 hours. 3.  Continue propranolol ER 120mg  daily 4.  Since you have neck pain and persistent headaches that have failed physical therapy, will get MRI of cervical spine without contrast 5.  Follow up in 3 months.

## 2015-03-10 NOTE — Telephone Encounter (Signed)
Valium 5mg .  She needs somebody to drive her

## 2015-03-10 NOTE — Telephone Encounter (Signed)
Pt called to request "something for her nerves" to take for MRI. Please advise.

## 2015-03-10 NOTE — Telephone Encounter (Signed)
Done

## 2015-03-19 ENCOUNTER — Other Ambulatory Visit: Payer: 59

## 2015-03-19 ENCOUNTER — Ambulatory Visit
Admission: RE | Admit: 2015-03-19 | Discharge: 2015-03-19 | Disposition: A | Discharge status: Home / Self Care | Payer: 59 | Source: Ambulatory Visit | Attending: Neurology | Admitting: Neurology

## 2015-03-19 DIAGNOSIS — G43009 Migraine without aura, not intractable, without status migrainosus: Secondary | ICD-10-CM

## 2015-03-19 DIAGNOSIS — M542 Cervicalgia: Secondary | ICD-10-CM

## 2015-03-20 ENCOUNTER — Telehealth: Payer: Self-pay

## 2015-03-20 ENCOUNTER — Other Ambulatory Visit: Payer: 59

## 2015-03-20 NOTE — Telephone Encounter (Signed)
Message relayed to patient. Verbalized understanding and denied questions.   

## 2015-03-20 NOTE — Telephone Encounter (Signed)
-----   Message from Pieter Partridge, DO sent at 03/20/2015  8:02 AM EST ----- MRI does not reveal any structural cause for the headaches.

## 2015-05-01 ENCOUNTER — Telehealth: Payer: Self-pay
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
## 2015-05-01 NOTE — Telephone Encounter (Signed)
Letter composed. Placed in provider's inbox for approval and signature.

## 2015-05-14 ENCOUNTER — Encounter: Payer: Self-pay | Admitting: Gynecology

## 2015-05-14 ENCOUNTER — Ambulatory Visit (INDEPENDENT_AMBULATORY_CARE_PROVIDER_SITE_OTHER): Payer: 59 | Admitting: Gynecology

## 2015-05-14 VITALS — BP 124/70

## 2015-05-14 DIAGNOSIS — R1031 Right lower quadrant pain: Secondary | ICD-10-CM
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
## 2015-05-14 NOTE — Patient Instructions (Signed)
Follow up for ultrasound as scheduled 

## 2015-05-15 LAB — URINALYSIS W MICROSCOPIC + REFLEX CULTURE
Bacteria, UA: NONE SEEN [HPF]
Bilirubin Urine: NEGATIVE
CRYSTALS: NONE SEEN [HPF]
Casts: NONE SEEN [LPF]
Glucose, UA: NEGATIVE
Hgb urine dipstick: NEGATIVE
Ketones, ur: NEGATIVE
NITRITE: NEGATIVE
PH: 6.5 (ref 5.0–8.0)
Protein, ur: NEGATIVE
SPECIFIC GRAVITY, URINE: 1.013 (ref 1.001–1.035)
YEAST: NONE SEEN [HPF]

## 2015-05-16 LAB — URINE CULTURE: Colony Count: 75000

## 2015-05-22 ENCOUNTER — Ambulatory Visit (INDEPENDENT_AMBULATORY_CARE_PROVIDER_SITE_OTHER): Payer: 59 | Admitting: Gynecology

## 2015-05-22 ENCOUNTER — Ambulatory Visit (INDEPENDENT_AMBULATORY_CARE_PROVIDER_SITE_OTHER): Payer: 59

## 2015-05-22 ENCOUNTER — Encounter: Payer: Self-pay | Admitting: Gynecology

## 2015-05-22 VITALS — BP 120/76

## 2015-05-22 DIAGNOSIS — R1031 Right lower quadrant pain: Secondary | ICD-10-CM

## 2015-05-22 DIAGNOSIS — R102 Pelvic and perineal pain: Secondary | ICD-10-CM
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
## 2015-05-22 NOTE — Patient Instructions (Signed)
Call if your pain returns or persists and we will arrange to have you see a gastroenterologist.

## 2015-05-28 ENCOUNTER — Encounter: Payer: Self-pay | Admitting: Neurology

## 2015-05-28 ENCOUNTER — Ambulatory Visit (INDEPENDENT_AMBULATORY_CARE_PROVIDER_SITE_OTHER): Payer: 59 | Admitting: Neurology

## 2015-05-28 VITALS — BP 104/66 | HR 117 | Ht 62.5 in | Wt 150.0 lb

## 2015-05-28 DIAGNOSIS — G43009 Migraine without aura, not intractable, without status migrainosus: Secondary | ICD-10-CM

## 2015-05-28 DIAGNOSIS — R519 Headache, unspecified: Secondary | ICD-10-CM | POA: Insufficient documentation

## 2015-05-28 MED ORDER — TRAMADOL HCL 50 MG PO TABS: 50.0000 mg | ORAL_TABLET | Freq: Four times a day (QID) | ORAL | Status: DC | PRN

## 2015-05-28 MED ORDER — PROPRANOLOL HCL ER 120 MG PO CP24: 120.0000 mg | ORAL_CAPSULE | Freq: Every day | ORAL | Status: DC
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
## 2015-05-29 ENCOUNTER — Encounter: Payer: Self-pay | Admitting: Family Medicine

## 2015-05-29 ENCOUNTER — Ambulatory Visit (INDEPENDENT_AMBULATORY_CARE_PROVIDER_SITE_OTHER): Payer: 59 | Admitting: Family Medicine

## 2015-05-29 VITALS — BP 118/82 | HR 78 | Temp 98.4°F | Ht 62.5 in | Wt 150.0 lb

## 2015-05-29 DIAGNOSIS — J0100 Acute maxillary sinusitis, unspecified: Secondary | ICD-10-CM

## 2015-05-29 DIAGNOSIS — J019 Acute sinusitis, unspecified: Secondary | ICD-10-CM | POA: Insufficient documentation

## 2015-05-29 DIAGNOSIS — J302 Other seasonal allergic rhinitis: Secondary | ICD-10-CM | POA: Diagnosis not present

## 2015-05-29 MED ORDER — MONTELUKAST SODIUM 10 MG PO TABS: 10.0000 mg | ORAL_TABLET | Freq: Every day | ORAL | Status: DC

## 2015-05-29 MED ORDER — HYDROCOD POLST-CPM POLST ER 10-8 MG/5ML PO SUER: 5.0000 mL | Freq: Two times a day (BID) | ORAL | Status: DC

## 2015-05-29 MED ORDER — AMOXICILLIN-POT CLAVULANATE 875-125 MG PO TABS: 1.0000 | ORAL_TABLET | Freq: Two times a day (BID) | ORAL | Status: DC

## 2015-05-29 NOTE — Patient Instructions (Signed)
I think you have a sinus infection with a post viral cough and allergies Get back on singulair one pill each night (since you have failed the otc medicines) Drink fluids and rest  Take augmentin for sinus infection and finish it all  For cough - tussionex with caution of sedation until cough is better   Update if not starting to improve in a week or if worsening

## 2015-05-29 NOTE — Progress Notes (Signed)
Pre visit review using our clinic review tool, if applicable. No additional management support is needed unless otherwise documented below in the visit note. 

## 2015-05-29 NOTE — Progress Notes (Signed)
Subjective:    Patient ID: [name], female    DOB: 07/29/1965, 50 y.o.   MRN: [id number]  HPI Here with uri symptoms and allergies   Thinks the head cold started 2 weeks ago  Nose runs constantly /some sneezy and some stuffiness Clear nasal discharge  Headaches are worse  Possibly due to sinus congestion   No ST  Cough is dry and hacking  Chest is sore from cough   No fever   otc taking thera flu and alka selzer plus - helped at first and not now   Takes migraine proph meds     Patient Active Problem List   Diagnosis Date Noted  . Migraine without aura and without status migrainosus, not intractable 05/28/2015  . Neck pain 03/10/2015  . Routine general medical examination at a health care facility 11/15/2014  . BPPV (benign paroxysmal positional vertigo) 09/30/2014  . Abdominal pain, unspecified site 12/25/2013  . Cough 07/29/2013  . 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 Family history of brain aneurysm 01/07/2013  . Eczema 12/21/2012  . Headache(784.0) 08/21/2012  . Breast mass, left 07/05/2012  . ALLERGIC RHINITIS 03/20/2009  . GERD 08/20/2008  . RENAL CALCULUS, HX OF 08/20/2008  . History of anemia 03/18/2008  . POSITIVE PPD 10/02/2007   Past Medical History  Diagnosis Date  . Anemia     iron deficiency anemia  . Sickle cell trait ([place])   . Allergy     allergic rhinitis  . Personal history of kidney stones   . PPD positive     Hx of  . HPV in female     also some abnormal paps in past  . GERD (gastroesophageal reflux disease)   . Hiatal hernia   . Renal lithiasis 2013  . Complication of anesthesia     slow to awaken  . Headache     post concussion  . 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 Breast surgery      removal of L breast mass / benign /age 47-20  . Cesarean section      x3  . Tubal ligation      BTL  . Tonsillectomy and adenoidectomy      age 50  . Pelvic laparoscopy        x2 for cysts r/o endometriosis  . Laparoscopic vaginal hysterectomy  12/02/2010    menorrhagia, leiomyomata  . Cystoscopy  2008    L RPG, stone extraction ([place])  . Foot surgery  JULY 2013    RIGHT  . Appendectomy      1990's  . Laparoscopy N/A 05/24/2012    Procedure: LAPAROSCOPY OPERATIVE;  Surgeon: Anastasio Auerbach, MD;  Location: [facility];  Service: Gynecology;  Laterality: N/A;  . Salpingoophorectomy Right 05/24/2012    Procedure: SALPINGO OOPHORECTOMY;  Surgeon: Anastasio Auerbach, MD;  Location: [facility];  Service: Gynecology;  Laterality: Right;  . Esophagogastroduodenoscopy  06/15/2012    Several----normal    Social History  Substance Use Topics  . Smoking status: Never Smoker   . Smokeless tobacco: Never Used  . Alcohol Use: No   Family History  Problem Relation Age of Onset  . Cancer Maternal Aunt 54    breast CA  . Cancer Paternal Aunt     uterine CA  . Lupus Cousin   . Anesthesia problems Mother   . Hypertension Mother   . Aneurysm  Father   . Colon cancer Neg Hx   . Esophageal cancer Neg Hx   . Stomach cancer Neg Hx   . Rectal cancer Neg Hx   . Prostate cancer Brother    Allergies  Allergen Reactions  . Banana     Throat itches  . Morphine     REACTION: itch   Current Outpatient Prescriptions on File Prior to Visit  Medication Sig Dispense Refill  . naproxen (NAPROSYN) 250 MG tablet Take 250 mg by mouth 2 (two) times daily as needed for headache.    Marland Kitchen omeprazole (PRILOSEC) 40 MG capsule Take 1 capsule (40 mg total) by mouth daily. 90 capsule 3  . propranolol ER (INDERAL LA) 120 MG 24 hr capsule Take 1 capsule (120 mg total) by mouth daily. 30 capsule 6  . traMADol (ULTRAM) 50 MG tablet Take 1 tablet (50 mg total) by mouth every 6 (six) hours as needed. 60 tablet 5  . [DISCONTINUED] albuterol (PROAIR HFA) 108 (90 BASE) MCG/ACT inhaler Inhale 2 puffs into the lungs every 4 (four) hours as needed.       No current facility-administered medications on  file prior to visit.    Review of Systems  Constitutional: Positive for appetite change and fatigue. Negative for fever.  HENT: Positive for congestion, postnasal drip, rhinorrhea, sinus pressure, sneezing and sore throat. Negative for ear pain.   Eyes: Negative for pain and discharge.  Respiratory: Positive for cough. Negative for shortness of breath, wheezing and stridor.   Cardiovascular: Negative for chest pain.  Gastrointestinal: Negative for nausea, vomiting and diarrhea.  Genitourinary: Negative for urgency, frequency and hematuria.  Musculoskeletal: Negative for myalgias and arthralgias.  Skin: Negative for rash.  Neurological: Positive for headaches. Negative for dizziness, weakness and light-headedness.  Psychiatric/Behavioral: Negative for confusion and dysphoric mood.       Objective:   Physical Exam  Constitutional: She appears well-developed and well-nourished. No distress.  overwt and well appearing   HENT:  Head: Normocephalic and atraumatic.  Right Ear: External ear normal.  Left Ear: External ear normal.  Mouth/Throat: Oropharynx is clear and moist. No oropharyngeal exudate.  Nares are injected and congested  Bilateral maxillary sinus tenderness  Post nasal drip   Eyes: Conjunctivae and EOM are normal. Pupils are equal, round, and reactive to light. Right eye exhibits no discharge. Left eye exhibits no discharge.  Neck: Normal range of motion. Neck supple.  Cardiovascular: Normal rate and regular rhythm.   Pulmonary/Chest: Effort normal and breath sounds normal. No respiratory distress. She has no wheezes. She has no rales.  Lymphadenopathy:    She has no cervical adenopathy.  Neurological: She is alert. No cranial nerve deficit.  Skin: Skin is warm and dry. No rash noted.  Psychiatric: She has a normal mood and affect.          Assessment & Plan:   Problem List Items Addressed This Visit      Respiratory   Acute sinusitis - Primary    I think you  have a sinus infection with a post viral cough and allergies Get back on singulair one pill each night (since you have failed the otc medicines) Drink fluids and rest  Take augmentin for sinus infection and finish it all  For cough - tussionex with caution of sedation until cough is better   Update if not starting to improve in a week or if worsening        Relevant Medications   chlorpheniramine-HYDROcodone (TUSSIONEX  PENNKINETIC ER) 10-8 MG/5ML SUER   amoxicillin-clavulanate (AUGMENTIN) 875-125 MG tablet   Allergic rhinitis    Pt will start back on singulair 10 mg daily  Has failed otc antihistamines and nasal sprays alone Disc allergen avoidance

## 2015-05-31 NOTE — Assessment & Plan Note (Signed)
Pt will start back on singulair 10 mg daily  Has failed otc antihistamines and nasal sprays alone Disc allergen avoidance

## 2015-05-31 NOTE — Assessment & Plan Note (Signed)
I think you have a sinus infection with a post viral cough and allergies Get back on singulair one pill each night (since you have failed the otc medicines) Drink fluids and rest  Take augmentin for sinus infection and finish it all  For cough - tussionex with caution of sedation until cough is better   Update if not starting to improve in a week or if worsening

## 2015-06-22 ENCOUNTER — Encounter: Payer: Self-pay | Admitting: Gynecology

## 2015-06-22 ENCOUNTER — Ambulatory Visit (INDEPENDENT_AMBULATORY_CARE_PROVIDER_SITE_OTHER): Payer: 59 | Admitting: Gynecology

## 2015-06-22 VITALS — BP 120/74 | Ht 63.0 in | Wt 151.0 lb

## 2015-06-22 DIAGNOSIS — R1031 Right lower quadrant pain: Secondary | ICD-10-CM | POA: Diagnosis not present

## 2015-06-22 DIAGNOSIS — Z01419 Encounter for gynecological examination (general) (routine) without abnormal findings: Secondary | ICD-10-CM

## 2015-06-22 DIAGNOSIS — N941 Unspecified dyspareunia: Secondary | ICD-10-CM | POA: Diagnosis not present

## 2015-06-22 DIAGNOSIS — N898 Other specified noninflammatory disorders of vagina: Secondary | ICD-10-CM | POA: Diagnosis not present

## 2015-06-22 LAB — WET PREP FOR TRICH, YEAST, CLUE
Clue Cells Wet Prep HPF POC: NONE SEEN
Trich, Wet Prep: NONE SEEN
Yeast Wet Prep HPF POC: NONE SEEN

## 2015-06-22 MED ORDER — FLUCONAZOLE 150 MG PO TABS: 150.0000 mg | ORAL_TABLET | Freq: Once | ORAL | Status: DC

## 2015-06-22 NOTE — Progress Notes (Signed)
    [name] [birth year] [id number]        50 y.o.  [id number]  for annual exam.  Several issues noted below.  Past medical history,surgical history, problem list, medications, allergies, family history and social history were all reviewed and documented as reviewed in the EPIC chart.  ROS:  Performed with pertinent positives and negatives included in the history, assessment and plan.   Additional significant findings :  none   Exam: [name] assistant Filed Vitals:   06/22/15 1138  BP: 120/74  Height: 5\' 3"  (1.6 m)  Weight: 151 lb (68.493 kg)   General appearance:  Normal affect, orientation and appearance. Skin: Grossly normal HEENT: Without gross lesions.  No cervical or supraclavicular adenopathy. Thyroid normal.  Lungs:  Clear without wheezing, rales or rhonchi Cardiac: RR, without RMG Abdominal:  Soft, nontender, without masses, guarding, rebound, organomegaly or hernia Breasts:  Examined lying and sitting without masses, retractions, discharge or axillary adenopathy.  2 cm axillary fullness noted on the right. No overlying skin changes. Pelvic:  Ext/BUS/vagina with white discharge  Adnexa without masses or tenderness    Anus and perineum normal   Rectovaginal normal sphincter tone without palpated masses or tenderness.    Assessment/Plan:  50 y.o. [name] female for annual exam.   1. Vaginal irritation. Patient notes over the last several weeks some intermittent vaginal irritation. No significant discharge odor or urinary symptoms such as frequency dysuria urgency.  Wet prep is unremarkable but exam is consistent with yeast. I'm going to cover with Diflucan 150 mg 1 dose. Follow up if symptoms persist, worsen or recur. 2. Dyspareunia. Patient noted some vaginal dryness with intercourse. Has never used lubricants. Recommended OTC lubricants or trial of oil such as vegetable oil. Patient will follow up if continues to be a problem. [place] normal last year normal. No  significant hot flushes or night sweats to suggest early menopause. 3. History right lower quadrant pain. Recent ultrasound is negative. Occurs intermittently. Has been going on for several years and not progressing.  Currently without pain. Has been seen by gastroenterology. At this point patients comfortable with following.  Recently treated for H. Pylori. 4. Right axillary nodule. Comes and goes and has been present for over a year. Previously worked up to include an ultrasound consistent with a ectopic breast tissue. Saw a general surgeon and discussed excision but at this point patient is not interested and prefers to monitor.  Exam today is stable from exam last year showing some fullness. Patient's comfortable with following. 5. Mammography 08/2014. Continue with annual mammography when due. SBE monthly reviewed. 6. Pap smear 06/2014. No Pap smear done today.  History of HPV-related abnormal Pap smears a number of years ago with normal Pap smears since then. 7. Health maintenance. No routine blood work done as patient does this at her primary physician's office. Follow up 1 year, sooner as needed.   Anastasio Auerbach MD, 11:54 AM 06/22/2015

## 2015-06-22 NOTE — Patient Instructions (Signed)

## 2015-07-14 ENCOUNTER — Ambulatory Visit (INDEPENDENT_AMBULATORY_CARE_PROVIDER_SITE_OTHER): Payer: 59 | Admitting: Internal Medicine

## 2015-07-14 ENCOUNTER — Encounter: Payer: Self-pay | Admitting: Internal Medicine

## 2015-07-14 VITALS — BP 120/72 | HR 75 | Temp 98.6°F | Wt 155.2 lb

## 2015-07-14 DIAGNOSIS — S46812A Strain of other muscles, fascia and tendons at shoulder and upper arm level, left arm, initial encounter: Secondary | ICD-10-CM | POA: Diagnosis not present

## 2015-07-14 MED ORDER — METHOCARBAMOL 500 MG PO TABS: 500.0000 mg | ORAL_TABLET | Freq: Every evening | ORAL | Status: DC | PRN

## 2015-07-14 NOTE — Patient Instructions (Signed)

## 2015-07-14 NOTE — Progress Notes (Signed)
Pre visit review using our clinic review tool, if applicable. No additional management support is needed unless otherwise documented below in the visit note. 
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
## 2015-09-05 ENCOUNTER — Emergency Department (HOSPITAL_COMMUNITY)
Admission: EM | Admit: 2015-09-05 | Discharge: 2015-09-05 | Disposition: A | Discharge status: Home / Self Care | Payer: 59 | Attending: Emergency Medicine | Admitting: Emergency Medicine

## 2015-09-05 ENCOUNTER — Emergency Department (HOSPITAL_COMMUNITY): Payer: 59

## 2015-09-05 ENCOUNTER — Encounter (HOSPITAL_COMMUNITY): Payer: Self-pay | Admitting: Nurse Practitioner

## 2015-09-05 DIAGNOSIS — R300 Dysuria: Secondary | ICD-10-CM | POA: Insufficient documentation

## 2015-09-05 DIAGNOSIS — K219 Gastro-esophageal reflux disease without esophagitis: Secondary | ICD-10-CM | POA: Insufficient documentation

## 2015-09-05 DIAGNOSIS — Z9089 Acquired absence of other organs: Secondary | ICD-10-CM | POA: Insufficient documentation

## 2015-09-05 DIAGNOSIS — Z90710 Acquired absence of both cervix and uterus: Secondary | ICD-10-CM | POA: Insufficient documentation

## 2015-09-05 DIAGNOSIS — Z8679 Personal history of other diseases of the circulatory system: Secondary | ICD-10-CM | POA: Insufficient documentation

## 2015-09-05 DIAGNOSIS — Z87448 Personal history of other diseases of urinary system: Secondary | ICD-10-CM | POA: Insufficient documentation

## 2015-09-05 DIAGNOSIS — R1084 Generalized abdominal pain: Secondary | ICD-10-CM | POA: Insufficient documentation

## 2015-09-05 DIAGNOSIS — Z79899 Other long term (current) drug therapy: Secondary | ICD-10-CM | POA: Diagnosis not present

## 2015-09-05 DIAGNOSIS — R1031 Right lower quadrant pain: Secondary | ICD-10-CM | POA: Diagnosis present

## 2015-09-05 DIAGNOSIS — Z862 Personal history of diseases of the blood and blood-forming organs and certain disorders involving the immune mechanism: Secondary | ICD-10-CM | POA: Insufficient documentation

## 2015-09-05 DIAGNOSIS — R110 Nausea: Secondary | ICD-10-CM | POA: Diagnosis not present

## 2015-09-05 DIAGNOSIS — Z3202 Encounter for pregnancy test, result negative: Secondary | ICD-10-CM | POA: Diagnosis not present

## 2015-09-05 DIAGNOSIS — Z9851 Tubal ligation status: Secondary | ICD-10-CM | POA: Diagnosis not present

## 2015-09-05 DIAGNOSIS — Z87442 Personal history of urinary calculi: Secondary | ICD-10-CM | POA: Insufficient documentation

## 2015-09-05 DIAGNOSIS — Z8619 Personal history of other infectious and parasitic diseases: Secondary | ICD-10-CM | POA: Diagnosis not present

## 2015-09-05 LAB — URINALYSIS, ROUTINE W REFLEX MICROSCOPIC
Bilirubin Urine: NEGATIVE
Glucose, UA: NEGATIVE mg/dL
Hgb urine dipstick: NEGATIVE
Ketones, ur: NEGATIVE mg/dL
LEUKOCYTES UA: NEGATIVE
NITRITE: NEGATIVE
PROTEIN: NEGATIVE mg/dL
Specific Gravity, Urine: >1.046 — ABNORMAL HIGH (ref 1.005–1.030)
pH: 6.5 (ref 5.0–8.0)

## 2015-09-05 LAB — I-STAT BETA HCG BLOOD, ED (MC, WL, AP ONLY)

## 2015-09-05 LAB — COMPREHENSIVE METABOLIC PANEL
ALK PHOS: 55 U/L (ref 38–126)
ALT: 17 U/L (ref 14–54)
ANION GAP: 6 (ref 5–15)
AST: 18 U/L (ref 15–41)
Albumin: 3.8 g/dL (ref 3.5–5.0)
BILIRUBIN TOTAL: 0.3 mg/dL (ref 0.3–1.2)
BUN: 12 mg/dL (ref 6–20)
CALCIUM: 9.3 mg/dL (ref 8.9–10.3)
CO2: 24 mmol/L (ref 22–32)
Chloride: 105 mmol/L (ref 101–111)
Creatinine, Ser: 0.8 mg/dL (ref 0.44–1.00)
Glucose, Bld: 99 mg/dL (ref 65–99)
Potassium: 3.8 mmol/L (ref 3.5–5.1)
Sodium: 135 mmol/L (ref 135–145)
TOTAL PROTEIN: 6.4 g/dL — AB (ref 6.5–8.1)

## 2015-09-05 LAB — CBC
HCT: 35.9 % — ABNORMAL LOW (ref 36.0–46.0)
HEMOGLOBIN: 12.3 g/dL (ref 12.0–15.0)
MCH: 25.9 pg — ABNORMAL LOW (ref 26.0–34.0)
MCHC: 34.3 g/dL (ref 30.0–36.0)
MCV: 75.7 fL — ABNORMAL LOW (ref 78.0–100.0)
PLATELETS: 310 10*3/uL (ref 150–400)
RBC: 4.74 MIL/uL (ref 3.87–5.11)
RDW: 14.2 % (ref 11.5–15.5)
WBC: 9.3 10*3/uL (ref 4.0–10.5)

## 2015-09-05 MED ORDER — SODIUM CHLORIDE 0.9 % IV BOLUS (SEPSIS)
1000.0000 mL | Freq: Once | INTRAVENOUS | Status: AC
  Administered 2015-09-05: 1000 mL via INTRAVENOUS

## 2015-09-05 MED ORDER — KETOROLAC TROMETHAMINE 30 MG/ML IJ SOLN
15.0000 mg | Freq: Once | INTRAMUSCULAR | Status: AC
  Administered 2015-09-05: 15 mg via INTRAVENOUS
  Filled 2015-09-05: qty 1

## 2015-09-05 MED ORDER — SENNOSIDES-DOCUSATE SODIUM 8.6-50 MG PO TABS: 2.0000 | ORAL_TABLET | Freq: Every day | ORAL | Status: AC

## 2015-09-05 MED ORDER — IOPAMIDOL (ISOVUE-300) INJECTION 61%
INTRAVENOUS | Status: AC
  Filled 2015-09-05: qty 100

## 2015-09-05 MED ORDER — TRAMADOL HCL 50 MG PO TABS: 50.0000 mg | ORAL_TABLET | Freq: Four times a day (QID) | ORAL | Status: DC | PRN

## 2015-09-05 NOTE — ED Notes (Signed)
Patient verbalized understanding of discharge instructions and denies any further needs or questions at this time. VS stable. Patient ambulatory with steady gait.  

## 2015-09-05 NOTE — ED Notes (Signed)
Pt c/o RLQ and R flank pain since when waking this morning. Pain is increased when she urinates. Denies fevers, n/v, bowel/bladder changes. She is alert and breathing easily.

## 2015-09-05 NOTE — Discharge Instructions (Signed)
As discussed, it is important to follow-up with our surgical colleague, Dr. Marcello Moores.  In the interim, please monitor your condition carefully, and do not hesitate to return here for concerning changes in your condition.

## 2015-09-05 NOTE — ED Provider Notes (Signed)
CSN: [id number]     Arrival date & time 09/05/15  1838 History   First MD Initiated Contact with Patient 09/05/15 1941     Chief Complaint  Patient presents with  . Abdominal Pain     (Consider location/radiation/quality/duration/timing/severity/associated sxs/prior Treatment) HPI Patient presents with new right lower abdominal, right flank pain. Pain began earlier today, since onset has been persistent, with waxing/waning severity. Pain is sharp. Pain is not improved with OTC medication or tramadol. There is associated dysuria, nausea, but no vomiting, no diarrhea. Patient has history of kidney stone, states that the pain is similar, though not the same.  Past Medical History  Diagnosis Date  . Anemia     iron deficiency anemia  . Sickle cell trait ([place])   . Allergy     allergic rhinitis  . Personal history of kidney stones   . PPD positive     Hx of  . HPV in female     also some abnormal paps in past  . GERD (gastroesophageal reflux disease)   . Hiatal hernia   . Renal lithiasis 2013  . Complication of anesthesia     slow to awaken  . Headache     post concussion  . 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 Breast surgery      removal of L breast mass / benign /age [age]  . Cesarean section      x3  . Tubal ligation      BTL  . Tonsillectomy and adenoidectomy      age 33  . Pelvic laparoscopy      x2 for cysts r/o endometriosis  . Laparoscopic vaginal hysterectomy  12/02/2010    menorrhagia, leiomyomata  . Cystoscopy  2008    L RPG, stone extraction ([place])  . Foot surgery  JULY 2013    RIGHT  . Appendectomy      1990's  . Laparoscopy N/A 05/24/2012    Procedure: LAPAROSCOPY OPERATIVE;  Surgeon: Anastasio Auerbach, MD;  Location: [facility];  Service: Gynecology;  Laterality: N/A;  . Salpingoophorectomy Right 05/24/2012    Procedure: SALPINGO OOPHORECTOMY;  Surgeon: Anastasio Auerbach, MD;   Location: [facility];  Service: Gynecology;  Laterality: Right;  . Esophagogastroduodenoscopy  06/15/2012    Several----normal    Family History  Problem Relation Age of Onset  . Breast cancer Maternal Aunt 50  . Cancer Paternal Aunt     uterine CA  . Lupus Cousin   . Anesthesia problems Mother   . Hypertension Mother   . Aneurysm Father   . Colon cancer Neg Hx   . Esophageal cancer Neg Hx   . Stomach cancer Neg Hx   . Rectal cancer Neg Hx   . Prostate cancer Brother    Social History  Substance Use Topics  . Smoking status: Never Smoker   . Smokeless tobacco: Never Used  . Alcohol Use: No   OB History    Gravida Para Term Preterm AB TAB SAB Ectopic Multiple Living   3 3 2 1      3      Review of Systems  Constitutional:       Per HPI, otherwise negative  HENT:       Per HPI, otherwise negative  Respiratory:       Per HPI, otherwise negative  Cardiovascular:       Per HPI, otherwise  negative  Gastrointestinal: Negative for vomiting.  Endocrine:       Negative aside from HPI  Genitourinary:       Neg aside from HPI   Musculoskeletal:       Per HPI, otherwise negative  Skin: Negative.   Neurological: Negative for syncope.      Allergies  Banana and Morphine  Home Medications   Prior to Admission medications   Medication Sig Start Date End Date Taking? Authorizing Provider  methocarbamol (ROBAXIN) 500 MG tablet Take 1 tablet (500 mg total) by mouth at bedtime as needed for muscle spasms. 07/14/15   Jearld Fenton, NP  montelukast (SINGULAIR) 10 MG tablet Take 1 tablet (10 mg total) by mouth at bedtime. 05/29/15   Abner Greenspan, MD  omeprazole (PRILOSEC) 40 MG capsule Take 1 capsule (40 mg total) by mouth daily. 11/21/14   Abner Greenspan, MD  propranolol ER (INDERAL LA) 120 MG 24 hr capsule Take 1 capsule (120 mg total) by mouth daily. 05/28/15   Pieter Partridge, DO  traMADol (ULTRAM) 50 MG tablet Take 1 tablet (50 mg total) by mouth every 6 (six) hours as needed. 05/28/15    Pieter Partridge, DO   BP 123/49 mmHg  Pulse 71  Temp(Src) 98.5 F (36.9 C) (Oral)  Resp 16  SpO2 99%  LMP 11/24/2010 Physical Exam  Constitutional: She is oriented to person, place, and time. She appears well-developed and well-nourished. No distress.  HENT:  Head: Normocephalic and atraumatic.  Eyes: Conjunctivae and EOM are normal.  Cardiovascular: Normal rate and regular rhythm.   Pulmonary/Chest: Effort normal and breath sounds normal. No stridor. No respiratory distress.  Abdominal: She exhibits no distension. There is generalized tenderness. There is guarding.  Musculoskeletal: She exhibits no edema.  Neurological: She is alert and oriented to person, place, and time. No cranial nerve deficit.  Skin: Skin is warm and dry.  Psychiatric: She has a normal mood and affect.  Nursing note and vitals reviewed.   ED Course  Procedures (including critical care time) Labs Review Labs Reviewed  COMPREHENSIVE METABOLIC PANEL - Abnormal; Notable for the following:    Total Protein 6.4 (*)    All other components within normal limits  CBC - Abnormal; Notable for the following:    HCT 35.9 (*)    MCV 75.7 (*)    MCH 25.9 (*)    All other components within normal limits  URINALYSIS, ROUTINE W REFLEX MICROSCOPIC (NOT AT [facility]) - Abnormal; Notable for the following:    Specific Gravity, Urine >1.046 (*)    All other components within normal limits  I-STAT BETA HCG BLOOD, ED ([facility], [facility], [facility] ONLY)    Imaging Review Ct Abdomen Pelvis W Contrast  09/05/2015  CLINICAL DATA:  Right lower quadrant more than left lower quadrant pain. Right flank pain. EXAM: CT ABDOMEN AND PELVIS WITH CONTRAST TECHNIQUE: Multidetector CT imaging of the abdomen and pelvis was performed using the standard protocol following bolus administration of intravenous contrast. CONTRAST:  100 cc Isovue-300 intravenous. COMPARISON:  12/08/2013 FINDINGS: Lower chest and abdominal wall: Dilated vessel in the right lower lobe,  partly seen. Based on comparison CT this drains into the left atrium. Appearance is stable from at least 2008 and presumably incidental. Hepatobiliary: Sub cm left hepatic cyst. No significant finding.No evidence of biliary obstruction or stone. Pancreas: Unremarkable. Spleen: Unremarkable. Adrenals/Urinary Tract: Negative adrenals. Bilateral lower pole nephrolithiasis measuring up to 4 mm. There are additional smaller calculi in  the upper collecting system on coronal reformats. No hydronephrosis or ureteral calculus. Unremarkable bladder. Reproductive:Hysterectomy. Findings of corpus luteum (crenulated peripherally enhancing intra-ovarian low density mass) on the left with small pelvic fluid that may be physiologic. Stomach/Bowel: There is new unexpected bowel anatomy with the somewhat redundant sigmoid colon extending superiorly and to the right, located right and posterior to the ascending colon and cecum. This has the arrangement of a pericecal internal hernia, with narrowing at the aperture. There is no associated bowel obstruction or wall thickening. Appendectomy. Vascular/Lymphatic: No acute vascular abnormality. No mass or adenopathy. Peritoneal: No ascites or pneumoperitoneum. Musculoskeletal: No acute abnormalities. IMPRESSION: 1. New unexpected location of the sigmoid colon right of the cecum, compatible with a pericecal hernia. No related obstruction or inflammation. 2. Small pelvic fluid which may be reactive in the setting of a left corpus luteum. 3. Bilateral nonobstructive nephrolithiasis and other incidental findings noted above. Electronically Signed   By: Monte Fantasia M.D.   On: 09/05/2015 21:16   I have personally reviewed and evaluated these images and lab results as part of my medical decision-making.   10:59 PM I discussed patient's CT findings with our general surgeon. Subsequently I demonstrated the images to the patient and multiple family members, we discussed the pericecal  hernia, as well as the demonstration of multiple kidney stones, but no evidence for nephrolithiasis, nor obstruction. I emphasized the importance of following up with our colorectal surgeon.   MDM   Final diagnoses:  Right lower quadrant abdominal pain  Pericecal hernia Patient presents with new right-sided abdominal pain. Patient has a history of kidney stone, given her description of increasingly severe pain, CT scan was performed in addition to labs, urinalysis. Patient's evaluation was notable for demonstration of redundant sigmoid colon, pericecal hernia. However, there is no evidence for obstruction, and the patient's pain was well-controlled here. After discussion with our general surgical colleagues to discuss appropriate management, the patient was discharged to follow-up with our colorectal surgeon in the clinic. Patient provided more analgesia, stool softener on discharge.  Carmin Muskrat, MD 09/05/15 2300

## 2015-09-22 ENCOUNTER — Ambulatory Visit: Payer: 59 | Admitting: Neurology

## 2015-10-22 ENCOUNTER — Other Ambulatory Visit: Payer: Self-pay | Admitting: Gynecology

## 2015-10-22 DIAGNOSIS — Z1231 Encounter for screening mammogram for malignant neoplasm of breast: Secondary | ICD-10-CM

## 2015-11-04 ENCOUNTER — Ambulatory Visit
Admission: RE | Admit: 2015-11-04 | Discharge: 2015-11-04 | Disposition: A | Discharge status: Home / Self Care | Payer: 59 | Source: Ambulatory Visit | Attending: Gynecology | Admitting: Gynecology

## 2015-11-04 DIAGNOSIS — Z1231 Encounter for screening mammogram for malignant neoplasm of breast: Secondary | ICD-10-CM

## 2015-11-12 ENCOUNTER — Telehealth: Payer: Self-pay

## 2015-11-12 ENCOUNTER — Ambulatory Visit (INDEPENDENT_AMBULATORY_CARE_PROVIDER_SITE_OTHER): Payer: 59

## 2015-11-12 DIAGNOSIS — G43801 Other migraine, not intractable, with status migrainosus: Secondary | ICD-10-CM

## 2015-11-12 NOTE — Telephone Encounter (Signed)
She can come in for a headache cocktail (she should have somebody drive her as it can cause drowsiness.  Otherwise, she can get just Toradol 60mg ). She can still take sumatriptan, as it is also used as second line/rescue medication.

## 2015-11-12 NOTE — Telephone Encounter (Signed)
Pt called with complaints of extreme migraine. Pt states she rarely misses work but had to today. Pt stated migraine began yesterday, it was unusual though because it was all of a sudden, when she normally has a slight headache a few days prior to a migraine. Yesterday, pt took Tramadol 50 mg and some ibuprofen with little to no relief. Pt did not take any sumatriptan, because she said she didn't think it would help because she did not take it prior to pain becoming severe since it was suddenly. Please advise.

## 2015-11-12 NOTE — Telephone Encounter (Signed)
Pt will come in for headache cocktail

## 2015-11-13 DIAGNOSIS — G43801 Other migraine, not intractable, with status migrainosus: Secondary | ICD-10-CM | POA: Diagnosis not present

## 2015-11-13 MED ORDER — METOCLOPRAMIDE HCL 5 MG/ML IJ SOLN
10.0000 mg | Freq: Once | INTRAVENOUS | Status: AC
  Administered 2015-11-13: 10 mg via INTRAMUSCULAR

## 2015-11-13 MED ORDER — KETOROLAC TROMETHAMINE 30 MG/ML IJ SOLN
60.0000 mg | Freq: Once | INTRAMUSCULAR | Status: AC
  Administered 2015-11-13: 60 mg via INTRAVENOUS

## 2015-11-13 MED ORDER — DIPHENHYDRAMINE HCL 50 MG/ML IJ SOLN
25.0000 mg | Freq: Once | INTRAMUSCULAR | Status: AC
  Administered 2015-11-13: 25 mg via INTRAMUSCULAR

## 2015-11-18 ENCOUNTER — Ambulatory Visit (INDEPENDENT_AMBULATORY_CARE_PROVIDER_SITE_OTHER): Payer: 59 | Admitting: Neurology

## 2015-11-18 ENCOUNTER — Encounter: Payer: Self-pay | Admitting: Neurology

## 2015-11-18 VITALS — BP 116/64 | HR 68 | Ht 63.0 in | Wt 157.0 lb

## 2015-11-18 DIAGNOSIS — G43009 Migraine without aura, not intractable, without status migrainosus: Secondary | ICD-10-CM | POA: Diagnosis not present

## 2015-11-18 MED ORDER — PROPRANOLOL HCL ER 120 MG PO CP24: 120.0000 mg | ORAL_CAPSULE | Freq: Every day | ORAL | 6 refills | Status: DC

## 2015-11-18 MED ORDER — TRAMADOL HCL 50 MG PO TABS: 50.0000 mg | ORAL_TABLET | Freq: Four times a day (QID) | ORAL | 5 refills | Status: DC | PRN

## 2015-11-18 MED ORDER — TOPIRAMATE 25 MG PO TABS: ORAL_TABLET | ORAL | 0 refills | Status: DC

## 2015-11-18 NOTE — Patient Instructions (Signed)
1.  We will start topiramate 25mg  at bedtime for 7 days, then increase to 2 tablets (50mg  total) at bedtime.  Contact me in 5 weeks with update and we can increase dose if needed. 2.  Continue propranolol ER 120mg  daily for now. 3.  Follow up in 6 months.
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
## 2015-12-08 ENCOUNTER — Telehealth: Payer: Self-pay

## 2015-12-08 NOTE — Telephone Encounter (Signed)
Received FMLA forms from [employer]. Completed to best of my ability then place in provider's box.

## 2015-12-11 DIAGNOSIS — Z029 Encounter for administrative examinations, unspecified: Secondary | ICD-10-CM

## 2015-12-14 ENCOUNTER — Ambulatory Visit (INDEPENDENT_AMBULATORY_CARE_PROVIDER_SITE_OTHER): Payer: 59 | Admitting: Family Medicine

## 2015-12-14 ENCOUNTER — Encounter: Payer: Self-pay | Admitting: Family Medicine

## 2015-12-14 VITALS — BP 130/70 | HR 74 | Temp 98.6°F | Ht 62.5 in | Wt 155.0 lb

## 2015-12-14 DIAGNOSIS — K469 Unspecified abdominal hernia without obstruction or gangrene: Secondary | ICD-10-CM | POA: Insufficient documentation

## 2015-12-14 DIAGNOSIS — Z1211 Encounter for screening for malignant neoplasm of colon: Secondary | ICD-10-CM

## 2015-12-14 DIAGNOSIS — K458 Other specified abdominal hernia without obstruction or gangrene: Secondary | ICD-10-CM | POA: Diagnosis not present

## 2015-12-14 DIAGNOSIS — Z23 Encounter for immunization: Secondary | ICD-10-CM | POA: Diagnosis not present

## 2015-12-14 DIAGNOSIS — Z0000 Encounter for general adult medical examination without abnormal findings: Secondary | ICD-10-CM | POA: Diagnosis not present

## 2015-12-14 MED ORDER — MONTELUKAST SODIUM 10 MG PO TABS: 10.0000 mg | ORAL_TABLET | Freq: Every day | ORAL | 3 refills | Status: DC

## 2015-12-14 MED ORDER — OMEPRAZOLE 40 MG PO CPDR: 40.0000 mg | DELAYED_RELEASE_CAPSULE | Freq: Every day | ORAL | 3 refills | Status: DC

## 2015-12-14 NOTE — Patient Instructions (Addendum)
Flu shot today Labs today  If hernia pain worsens- let us know so we can refer you to a surgeon  Stop at check out for referral  for screen colonoscopy

## 2015-12-14 NOTE — Assessment & Plan Note (Signed)
Reviewed health habits including diet and exercise and skin cancer prevention Reviewed appropriate screening tests for age  Also reviewed health mt list, fam hx and immunization status , as well as social and family history   See HPI Labs reviewed  Flu shot today Labs today Ref for first screening colonoscopy after 50th bday

## 2015-12-14 NOTE — Assessment & Plan Note (Signed)
Pt will update if pain continues or worsens-would ref to gen surg

## 2015-12-14 NOTE — Progress Notes (Signed)
Pre visit review using our clinic review tool, if applicable. No additional management support is needed unless otherwise documented below in the visit note. 

## 2015-12-14 NOTE — Assessment & Plan Note (Signed)
Refer for first screening colonoscopy 

## 2015-12-14 NOTE — Progress Notes (Signed)
Subjective:    Patient ID: [name], female    DOB: [birth year], 50 y.o.   MRN: [id number]  HPI Here for health maintenance exam and to review chronic medical problems    Wt Readings from Last 3 Encounters:  12/14/15 155 lb (70.3 kg)  11/18/15 157 lb (71.2 kg)  07/14/15 155 lb 4 oz (70.4 kg)  wt is down 2 lb -has been going to the gym  bmi is 27.9  Mammogram 8/17-negative Self breast exam - no lumps or changes   Pap 3/16- neg/ Dr Phineas Real- has one coming up in Nov   Tetanus shot 4/09  Unsure if she wants a flu shot   Colon cancer screening- she will turn 50 on 9/18  She is interested in colonoscopy for screening     She was dx with a peri cecal hernia at the ED- a surgeon did review  It causes a lot of pain  No quite wanting to see the surgeon yet   Results for orders placed or performed during the hospital encounter of 09/05/15  Comprehensive metabolic panel  Result Value Ref Range   Sodium 135 135 - 145 mmol/L   Potassium 3.8 3.5 - 5.1 mmol/L   Chloride 105 101 - 111 mmol/L   CO2 24 22 - 32 mmol/L   Glucose, Bld 99 65 - 99 mg/dL   BUN 12 6 - 20 mg/dL   Creatinine, Ser 0.80 0.44 - 1.00 mg/dL   Calcium 9.3 8.9 - 10.3 mg/dL   Total Protein 6.4 (L) 6.5 - 8.1 g/dL   Albumin 3.8 3.5 - 5.0 g/dL   AST 18 15 - 41 U/L   ALT 17 14 - 54 U/L   Alkaline Phosphatase 55 38 - 126 U/L   Total Bilirubin 0.3 0.3 - 1.2 mg/dL   GFR calc non Af Amer >60 >60 mL/min   GFR calc Af Amer >60 >60 mL/min   Anion gap 6 5 - 15  CBC  Result Value Ref Range   WBC 9.3 4.0 - 10.5 K/uL   RBC 4.74 3.87 - 5.11 MIL/uL   Hemoglobin 12.3 12.0 - 15.0 g/dL   HCT 35.9 (L) 36.0 - 46.0 %   MCV 75.7 (L) 78.0 - 100.0 fL   MCH 25.9 (L) 26.0 - 34.0 pg   MCHC 34.3 30.0 - 36.0 g/dL   RDW 14.2 11.5 - 15.5 %   Platelets 310 150 - 400 K/uL  Urinalysis, Routine w reflex microscopic  Result Value Ref Range   Color, Urine YELLOW YELLOW   APPearance CLEAR CLEAR   Specific Gravity, Urine >1.046 (H) 1.005  - 1.030   pH 6.5 5.0 - 8.0   Glucose, UA NEGATIVE NEGATIVE mg/dL   Hgb urine dipstick NEGATIVE NEGATIVE   Bilirubin Urine NEGATIVE NEGATIVE   Ketones, ur NEGATIVE NEGATIVE mg/dL   Protein, ur NEGATIVE NEGATIVE mg/dL   Nitrite NEGATIVE NEGATIVE   Leukocytes, UA NEGATIVE NEGATIVE  I-Stat beta hCG blood, ED  Result Value Ref Range   I-stat hCG, quantitative <5.0 <5 mIU/mL   Comment 3            These were from ED visit   Has Blair trait Will do other labs today  BP Readings from Last 3 Encounters:  12/14/15 130/70  11/18/15 116/64  09/05/15 109/57   Patient Active Problem List   Diagnosis Date Noted  . Hernia, internal 12/14/2015  . Colon cancer screening 12/14/2015  . Migraine without aura and without  status migrainosus, not intractable 05/28/2015  . Neck pain 03/10/2015  . Routine general medical examination at a health care facility 11/15/2014  . BPPV (benign paroxysmal positional vertigo) 09/30/2014  . Abdominal pain, unspecified site 12/25/2013  . Cough 07/29/2013  . 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 Family history of brain aneurysm 01/07/2013  . Eczema 12/21/2012  . Headache(784.0) 08/21/2012  . Allergic rhinitis 03/20/2009  . GERD 08/20/2008  . RENAL CALCULUS, HX OF 08/20/2008  . History of anemia 03/18/2008  . 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 Allergy    allergic rhinitis  . Anemia    iron deficiency anemia  . 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 Complication of anesthesia    slow to awaken  . GERD (gastroesophageal reflux disease)   . Headache    post concussion  . Hiatal hernia   . HPV in female    also some abnormal paps in past  . Personal history of kidney stones   . PPD positive    Hx of  . Renal lithiasis 2013  . Sickle cell trait [facility])    Past Surgical History:  Procedure Laterality Date  . APPENDECTOMY     1990's  . BREAST SURGERY     removal of L breast mass / benign /age 61-20  . CESAREAN  SECTION     x3  . CYSTOSCOPY  2008   L RPG, stone extraction ([place])  . ESOPHAGOGASTRODUODENOSCOPY  06/15/2012   Several----normal   . FOOT SURGERY  JULY 2013   RIGHT  . LAPAROSCOPIC VAGINAL HYSTERECTOMY  12/02/2010   menorrhagia, leiomyomata  . LAPAROSCOPY N/A 05/24/2012   Procedure: LAPAROSCOPY OPERATIVE;  Surgeon: Anastasio Auerbach, MD;  Location: [facility];  Service: Gynecology;  Laterality: N/A;  . PELVIC LAPAROSCOPY     x2 for cysts r/o endometriosis  . SALPINGOOPHORECTOMY Right 05/24/2012   Procedure: SALPINGO OOPHORECTOMY;  Surgeon: Anastasio Auerbach, MD;  Location: [facility];  Service: Gynecology;  Laterality: Right;  . TONSILLECTOMY AND ADENOIDECTOMY     age 48  . TUBAL LIGATION     BTL   Social History  Substance Use Topics  . Smoking status: Never Smoker  . Smokeless tobacco: Never Used  . Alcohol use No   Family History  Problem Relation Age of Onset  . Breast cancer Maternal Aunt 106  . Cancer Paternal Aunt     uterine CA  . Lupus Cousin   . Anesthesia problems Mother   . Hypertension Mother   . Aneurysm Father   . Colon cancer Neg Hx   . Esophageal cancer Neg Hx   . Stomach cancer Neg Hx   . Rectal cancer Neg Hx   . Prostate cancer Brother    Allergies  Allergen Reactions  . Banana Itching    Throat itches  . Morphine Itching   Current Outpatient Prescriptions on File Prior to Visit  Medication Sig Dispense Refill  . propranolol ER (INDERAL LA) 120 MG 24 hr capsule Take 1 capsule (120 mg total) by mouth daily. 30 capsule 6  . SUMAtriptan (IMITREX) 100 MG tablet Take 100 mg by mouth every 2 (two) hours as needed for migraine. May repeat in 2 hours if headache persists or recurs.    . topiramate (TOPAMAX) 25 MG tablet Take 1 tablet at bedtime for 7 days, then increase to 2 tablets at bedtime 60 tablet 0  . traMADol (ULTRAM) 50 MG  tablet Take 1 tablet (50 mg total) by mouth every 6 (six) hours as needed (pain). 20 tablet 5  . [DISCONTINUED] albuterol (PROAIR  HFA) 108 (90 BASE) MCG/ACT inhaler Inhale 2 puffs into the lungs every 4 (four) hours as needed.       No current facility-administered medications on file prior to visit.      Review of Systems Review of Systems  Constitutional: Negative for fever, appetite change, fatigue and unexpected weight change.  Eyes: Negative for pain and visual disturbance.  Respiratory: Negative for cough and shortness of breath.   Cardiovascular: Negative for cp or palpitations    Gastrointestinal: Negative for nausea, diarrhea and constipation. pos for occ RLQ pain from hernia ,neg for blood in stool Genitourinary: Negative for urgency and frequency.  Skin: Negative for pallor or rash   Neurological: Negative for weakness, light-headedness, numbness and headaches.  Hematological: Negative for adenopathy. Does not bruise/bleed easily.  Psychiatric/Behavioral: Negative for dysphoric mood. The patient is not nervous/anxious.         Objective:   Physical Exam  Constitutional: She appears well-developed and well-nourished. No distress.  Well appearing   HENT:  Head: Normocephalic and atraumatic.  Right Ear: External ear normal.  Left Ear: External ear normal.  Mouth/Throat: Oropharynx is clear and moist.  Boggy nares bilat  Eyes: Conjunctivae and EOM are normal. Pupils are equal, round, and reactive to light. No scleral icterus.  Neck: Normal range of motion. Neck supple. No JVD present. Carotid bruit is not present. No thyromegaly present.  Cardiovascular: Normal rate, regular rhythm, normal heart sounds and intact distal pulses.  Exam reveals no gallop.   Pulmonary/Chest: Effort normal and breath sounds normal. No respiratory distress. She has no wheezes. She exhibits no tenderness.  Abdominal: Soft. Bowel sounds are normal. She exhibits no distension, no abdominal bruit and no mass. There is no tenderness.  Genitourinary: No breast swelling, tenderness, discharge or bleeding.  Genitourinary Comments:  Breast exam: No mass, nodules, thickening, tenderness, bulging, retraction, inflamation, nipple discharge or skin changes noted.  No axillary or clavicular LA.      Musculoskeletal: Normal range of motion. She exhibits no edema or tenderness.  Lymphadenopathy:    She has no cervical adenopathy.  Neurological: She is alert. She has normal reflexes. No cranial nerve deficit. She exhibits normal muscle tone. Coordination normal.  Skin: Skin is warm and dry. No rash noted. No erythema. No pallor.  Psychiatric: She has a normal mood and affect.          Assessment & Plan:   Problem List Items Addressed This Visit      Other   Routine general medical examination at a health care facility - Primary    Reviewed health habits including diet and exercise and skin cancer prevention Reviewed appropriate screening tests for age  Also reviewed health mt list, fam hx and immunization status , as well as social and family history   See HPI Labs reviewed  Flu shot today Labs today Ref for first screening colonoscopy after 50th bday      Relevant Orders   TSH   Lipid panel   Hernia, internal    Pt will update if pain continues or worsens-would ref to gen surg      Colon cancer screening    Refer for first screening colonoscopy      Relevant Orders   Ambulatory referral to Gastroenterology    Other Visit Diagnoses    Need for influenza vaccination  Relevant Orders   Flu Vaccine QUAD 36+ mos IM (Completed)

## 2015-12-15 LAB — LIPID PANEL
CHOL/HDL RATIO: 4
Cholesterol: 205 mg/dL — ABNORMAL HIGH (ref 0–200)
HDL: 53.9 mg/dL (ref >39.00)
LDL CALC: 132 mg/dL — AB (ref 0–99)
NONHDL: 151.02
Triglycerides: 96 mg/dL (ref 0.0–149.0)
VLDL: 19.2 mg/dL (ref 0.0–40.0)

## 2015-12-15 LAB — TSH: TSH: 1.58 u[IU]/mL (ref 0.35–4.50)

## 2015-12-18 ENCOUNTER — Telehealth: Payer: Self-pay | Admitting: Family Medicine

## 2015-12-18 NOTE — Telephone Encounter (Signed)
PLEASE NOTE: All timestamps contained within this report are represented as [place] Standard Time. CONFIDENTIALTY NOTICE: This fax transmission is intended only for the addressee. It contains information that is legally privileged, confidential or otherwise protected from use or disclosure. If you are not the intended recipient, you are strictly prohibited from reviewing, disclosing, copying using or disseminating any of this information or taking any action in reliance on or regarding this information. If you have received this fax in error, please notify us immediately by telephone so that we can arrange for its return to [place]. Phone: [phone], Toll-Free: [phone], Fax: [phone] Page: 1 of 3 Call Id: [id number] [place] Patient Name: [name] Gender: Female DOB: [birth year] Age: 50 Y 11 M 28 D Return Phone Number: [phone] (Primary) Address: City/State/Zip: [place] Client [facility] - Client Client Site [place] Tower, Roque Lias - MD Contact Type Call Who Is Calling Patient / Member / Family / Caregiver Call Type Triage / Clinical Relationship To Patient Self Return Phone Number [phone] (Primary) Chief Complaint Cough Reason for Call Symptomatic / Request for Health Information Initial Comment caller states she got flu shot on Monday and developed a cough on Wednesday Appointment Disposition EMR Appointment Not Necessary Info pasted into Epic Yes PreDisposition Did not know what to do Translation No Nurse Assessment Nurse: Verlin Fester RN, [facility] Kidney Date/Time (Eastern Time): 12/18/2015 10:00:01 AM Confirm and document reason for call. If symptomatic, describe symptoms. You must click the next button to save text entered. ---Patient states she had the flu shot on Monday and she has runny nose and cough since Wednesday Has  the patient traveled out of the country within the last 30 days? ---No Does the patient have any new or worsening symptoms? ---Yes Will a triage be completed? ---Yes Related visit to physician within the last 2 weeks? ---No Does the PT have any chronic conditions? (i.e. diabetes, asthma, etc.) ---No Is the patient pregnant or possibly pregnant? (Ask all females between the ages of 63-55) ---No Is this a behavioral health or substance abuse call? ---No Guidelines Guideline Title Affirmed Question Affirmed Notes Nurse Date/Time (Eastern Time) Immunization Reactions Influenza (TIV; Injection) injected vaccine reactions (all triage questions negative) Noe, RN, [facility] 12/18/2015 10:00:57 AM Cough - Acute Productive Cough with cold symptoms (e.g., runny nose, postnasal drip, Verlin Fester, RN, [facility] 12/18/2015 10:04:15 AM PLEASE NOTE: All timestamps contained within this report are represented as [place] Standard Time. CONFIDENTIALTY NOTICE: This fax transmission is intended only for the addressee. It contains information that is legally privileged, confidential or otherwise protected from use or disclosure. If you are not the intended recipient, you are strictly prohibited from reviewing, disclosing, copying using or disseminating any of this information or taking any action in reliance on or regarding this information. If you have received this fax in error, please notify us immediately by telephone so that we can arrange for its return to [place]. Phone: [phone], Toll-Free: [phone], Fax: [phone] Page: 2 of 3 Call Id: [id number] Guidelines Guideline Title Affirmed Question Affirmed Notes Nurse Date/Time Eilene Ghazi Time) throat clearing) (all triage questions negative) Disp. Time Eilene Ghazi Time) Disposition Final User 12/18/2015 10:03:42 AM [place], RNStanton Kidney 12/18/2015 10:10:53 AM Home Care Yes Noe, RN, Nemiah Commander Understands: Yes Disagree/Comply: Helyn Numbers Understands:  Yes Disagree/Comply: Comply Care Advice Given Per Guideline HOME CARE: You should be able to treat this  at home. NOTE TO TRIAGER: INFLUENZA VIRUS VACCINE (TIV; INJECTED) - COMMON REACTIONS: * Local pain at injection site * Fever * Aches * If these symptoms occur, they usually last 1-2 days. YOU CANNOT GET THE FLU FROM THE VACCINE: Since the virus in the vaccine has been killed, you cannot get influenza from the vaccine. COLD PACK FOR LOCAL REACTION AT INJECTION SITE: * Apply a cold pack or ice in a wet washcloth to the area for 20 minutes. Repeat in 1 hour. * Then apply as needed for the first 48 hours after the injection. (Reason: reduce the pain and swelling.) PAIN MEDICINES: * For pain relief, take acetaminophen, ibuprofen, or naproxen. ACETAMINOPHEN (E.G., TYLENOL): * Use the lowest amount that makes your pain feel better. IBUPROFEN (E.G., MOTRIN, ADVIL): * Before taking any medicine, read all the instructions on the package. NAPROXEN (E.G., ALEVE): CALL BACK IF: * Fever lasts over 3 days * Pain lasts over 3 days * Redness or swelling lasts over 3 days CARE ADVICE given per Immunization Reactions (Adult) guideline. HOME CARE: You should be able to treat this at home. REASSURANCE: * It sounds like an uncomplicated cold that we can treat at home. * Colds are very common and may make you feel uncomfortable. * Colds are caused by viruses, and no medicine or 'shot' will cure an uncomplicated cold. Colds are usually not serious. * Coughing up mucus is very important for protecting the lungs from pneumonia. COUGH MEDICINES: - HOME REMEDY - HARD CANDY: Hard candy works just as well as medicineflavored OTC cough drops. Diabetics should use sugar-free candy. - HOME REMEDY - HONEY: This old home remedy has been shown to help decrease coughing at night. The adult dosage is 2 teaspoons (10 ml) at bedtime. Honey should not be given to infants under one year of age. - OTC COUGH SYRUPS: The most common cough  suppressant in OTC cough medications is dextromethorphan. Often the letters 'DM' appear in the name. - OTC COUGH DROPS: Cough drops can help a lot, especially for mild coughs. They reduce coughing by soothing your irritated throat and removing that tickle sensation in the back of the throat. Cough drops also have the advantage of portability - you can carry them with you. FOR A RUNNY NOSE - BLOW YOUR NOSE: * Nasal mucus and discharge help wash viruses and bacteria out of the nose and sinuses. * Blowing your nose helps clean out your nose. Use a handkerchief or a paper tissue. * If the skin around your nostrils gets irritated, apply a tiny amount of petroleum ointment to the nasal openings once or twice a day. FOR A STUFFY NOSE - USE NASAL WASHES: * Introduction: Saline (salt water) nasal irrigation (nasal wash) is an effective and simple home remedy for treating stuffy nose and sinus congestion. The nose can be irrigated by pouring, spraying, or squirting salt water into the nose and then letting it run back out. * How it Helps: The salt water rinses out excess mucus, washes out any irritants (dust, allergens) that might be present, and moistens the nasal cavity. * Methods: There are several ways to perform nasal irrigation. You can use a saline nasal spray bottle (available over-the-counter), a rubber ear syringe, a medical syringe without the needle, or a NETI POT. * STEP 1: Lean over a sink. * STEP 2: Gently squirt or spray warm salt water into one of your nostrils. * STEP 3: Some of the water may run into the back of your  throat. Spit this out. If you swallow the salt water it will not hurt you. NASAL DECONGESTANTS FOR A VERY STUFFY NOSE: * If you have a very stuffy nose, nasal decongestant medicines can shrink the swollen nasal mucosa and allow for easier breathing. If you have a very runny nose, these medicines can reduce the amount of drainage. They may be taken as pills by mouth or as a nasal  spray. * Do not take these PLEASE NOTE: All timestamps contained within this report are represented as [place] Standard Time. CONFIDENTIALTY NOTICE: This fax transmission is intended only for the addressee. It contains information that is legally privileged, confidential or otherwise protected from use or disclosure. If you are not the intended recipient, you are strictly prohibited from reviewing, disclosing, copying using or disseminating any of this information or taking any action in reliance on or regarding this information. If you have received this fax in error, please notify us immediately by telephone so that we can arrange for its return to [place]. Phone: [phone], Toll-Free: [phone], Fax: [phone] Page: 3 of 3 Call Id: [id number] Care Advice Given Per Guideline medications if you have high blood pressure, heart disease, prostate enlargement, or an overactive thyroid. CAUTION - NASAL DECONGESTANTS: * Most people do NOT need to use these medicines. If your nose feels blocked, you should try using nasal washes first. * PSEUDOEPHEDRINE (Sudafed) is available OTC in pill form. Typical adult dosage is two 30 mg tablets every 6 hours. * PHENYLEPHRINE (Sudafed PE) is available OTC in pill form. Typical adult dosage is one 10 mg tablet every 4 hours. * OXYMETAZOLINE NASAL DROPS (Afrin) are available OTC. Clean out the nose before using. Spray each nostril once, wait one minute for absorption, and then spray a second time. * PHENYLEPHRINE NASAL DROPS (Neo-Synephrine) are available OTC. Clean out the nose before using. Spray each nostril once, wait one minute for absorption, and then spray a second time. * Do not take these medications if you have used a MAO inhibitor such as isocarboxazid (Marplan), phenelzine (Nardil), rasagiline (Azilect), selegiline (Eldepryl, Emsam), or tranylcypromine (Parnate) in the past 2 weeks. Life-threatening side effects can occur. * Do not take these medications  if you are pregnant. * Do not use these medications for more than 3 days (Reason: rebound nasal congestion). FEVER MEDICINES: * For fever relief, take acetaminophen or ibuprofen. * Treat fevers above 101 F (38.3 C). * The goal of fever therapy is to bring the fever down to a comfortable level. Remember that fever medicine usually lowers fever 2-3 F (1-1.5 C). CONTAGIOUSNESS: * The cold virus is present in your nasal secretions. * Cover your nose and mouth with a tissue when you sneeze or cough. Wash your hands frequently. * You can return to work or school after the fever is gone and you feel well enough to participate in normal activities. CALL BACK IF: * Fever lasts over 3 days * Nasal discharge lasts over 10 days * Earache or facial pain develops * You become worse. CARE ADVICE given per Cough - Acute Productive (Adult) guideline.

## 2015-12-18 NOTE — Telephone Encounter (Signed)
Patient Name: [name]  DOB: 1965-09-19    Initial Comment caller states she got flu shot on Monday and developed a cough on Wednesday   Nurse Assessment  Nurse: Verlin Fester, RN, [facility] Kidney Date/Time Eilene Ghazi Time): 12/18/2015 10:00:01 AM  Confirm and document reason for call. If symptomatic, describe symptoms. You must click the next button to save text entered. ---Patient states she had the flu shot on Monday and she has runny nose and cough since Wednesday  Has the patient traveled out of the country within the last 30 days? ---No  Does the patient have any new or worsening symptoms? ---Yes  Will a triage be completed? ---Yes  Related visit to physician within the last 2 weeks? ---No  Does the PT have any chronic conditions? (i.e. diabetes, asthma, etc.) ---No  Is the patient pregnant or possibly pregnant? (Ask all females between the ages of 21-55) ---No  Is this a behavioral health or substance abuse call? ---No     Guidelines    Guideline Title Affirmed Question Affirmed Notes  Immunization Reactions Influenza (TIV; Injection) injected vaccine reactions (all triage questions negative)   Cough - Acute Productive Cough with cold symptoms (e.g., runny nose, postnasal drip, throat clearing) (all triage questions negative)    Final Disposition User   [place], RN, Stanton Kidney    Disagree/Comply: Comply    Disagree/Comply: Comply

## 2015-12-18 NOTE — Telephone Encounter (Signed)
Pt notified of Dr. Tower's comments and verbalized understanding  

## 2015-12-18 NOTE — Telephone Encounter (Signed)
I doubt the cough is related to the flu shot - she may have picked up a virus /or perhaps allergies Agree with advisement  F/u if worse or no improvement

## 2016-01-16 ENCOUNTER — Encounter (HOSPITAL_COMMUNITY): Payer: Self-pay

## 2016-01-16 DIAGNOSIS — K0889 Other specified disorders of teeth and supporting structures: Secondary | ICD-10-CM | POA: Diagnosis present

## 2016-01-16 DIAGNOSIS — Z79899 Other long term (current) drug therapy: Secondary | ICD-10-CM | POA: Insufficient documentation

## 2016-01-16 NOTE — ED Triage Notes (Signed)
Patient c/o dental pain to the top right molar.  Patient has swollen cheek and pain in her mouth.  Breathing even and unlabored.  NAD at this time.

## 2016-01-17 ENCOUNTER — Emergency Department (HOSPITAL_COMMUNITY)
Admission: EM | Admit: 2016-01-17 | Discharge: 2016-01-17 | Disposition: A | Discharge status: Home / Self Care | Payer: 59 | Attending: Emergency Medicine | Admitting: Emergency Medicine

## 2016-01-17 DIAGNOSIS — K0889 Other specified disorders of teeth and supporting structures: Secondary | ICD-10-CM

## 2016-01-17 MED ORDER — OXYCODONE-ACETAMINOPHEN 5-325 MG PO TABS
2.0000 | ORAL_TABLET | Freq: Once | ORAL | Status: AC
  Administered 2016-01-17: 2 via ORAL
  Filled 2016-01-17: qty 2

## 2016-01-17 MED ORDER — TRAMADOL HCL 50 MG PO TABS: 50.0000 mg | ORAL_TABLET | Freq: Two times a day (BID) | ORAL | 0 refills | Status: DC | PRN

## 2016-01-17 MED ORDER — PENICILLIN V POTASSIUM 500 MG PO TABS: 500.0000 mg | ORAL_TABLET | Freq: Four times a day (QID) | ORAL | 0 refills | Status: DC

## 2016-01-17 MED ORDER — PENICILLIN V POTASSIUM 500 MG PO TABS
500.0000 mg | ORAL_TABLET | Freq: Once | ORAL | Status: AC
  Administered 2016-01-17: 500 mg via ORAL
  Filled 2016-01-17: qty 1

## 2016-01-17 NOTE — ED Provider Notes (Signed)
[place] DEPT Provider Note   CSN: [id number] Arrival date & time: 01/16/16  2318     History   Chief Complaint Chief Complaint  Patient presents with  . Dental Pain    HPI [name] is a 50 y.o. female.  HPI   Patient is a 50 year old female who presents emergency per a complaining of right upper jaw dental pain which began last night has been gradually worsening. Pain is sharp and stabbing, rated severe, 10 out of 10, worse with movement palpation or attempted eat or drink. She feels that her cheek is swollen and she has radiation of pain to her ear neck jaw and forehead. She has a molar with cavities but has not been seen or treated recently. She reports she does have a dentist. She denies trismus, fever, chills, sweats, nausea, vomiting.  Past Medical History:  Diagnosis Date  . Allergy    allergic rhinitis  . Anemia    iron deficiency anemia  . 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 Complication of anesthesia    slow to awaken  . GERD (gastroesophageal reflux disease)   . Headache    post concussion  . Hiatal hernia   . HPV in female    also some abnormal paps in past  . Personal history of kidney stones   . PPD positive    Hx of  . Renal lithiasis 2013  . Sickle cell trait [facility])     Patient Active Problem List   Diagnosis Date Noted  . Hernia, internal 12/14/2015  . Colon cancer screening 12/14/2015  . Migraine without aura and without status migrainosus, not intractable 05/28/2015  . Neck pain 03/10/2015  . Routine general medical examination at a health care facility 11/15/2014  . BPPV (benign paroxysmal positional vertigo) 09/30/2014  . Abdominal pain, unspecified site 12/25/2013  . Cough 07/29/2013  . 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 Family history of brain aneurysm 01/07/2013  . Eczema 12/21/2012  . Headache(784.0) 08/21/2012  . Allergic rhinitis 03/20/2009  . GERD 08/20/2008  . RENAL CALCULUS, HX OF 08/20/2008  . History of  anemia 03/18/2008  . POSITIVE PPD 10/02/2007    Past Surgical History:  Procedure Laterality Date  . APPENDECTOMY     1990's  . BREAST SURGERY     removal of L breast mass / benign /age 17-20  . CESAREAN SECTION     x3  . CYSTOSCOPY  2008   L RPG, stone extraction ([place])  . ESOPHAGOGASTRODUODENOSCOPY  06/15/2012   Several----normal   . FOOT SURGERY  JULY 2013   RIGHT  . LAPAROSCOPIC VAGINAL HYSTERECTOMY  12/02/2010   menorrhagia, leiomyomata  . LAPAROSCOPY N/A 05/24/2012   Procedure: LAPAROSCOPY OPERATIVE;  Surgeon: Anastasio Auerbach, MD;  Location: [facility];  Service: Gynecology;  Laterality: N/A;  . PELVIC LAPAROSCOPY     x2 for cysts r/o endometriosis  . SALPINGOOPHORECTOMY Right 05/24/2012   Procedure: SALPINGO OOPHORECTOMY;  Surgeon: Anastasio Auerbach, MD;  Location: [facility];  Service: Gynecology;  Laterality: Right;  . TONSILLECTOMY AND ADENOIDECTOMY     age 31  . TUBAL LIGATION     BTL    OB History    Gravida Para Term Preterm AB Living   3 3 2 1   3    SAB TAB Ectopic Multiple Live Births           3       Home Medications    Prior  to Admission medications   Medication Sig Start Date End Date Taking? Authorizing Provider  montelukast (SINGULAIR) 10 MG tablet Take 1 tablet (10 mg total) by mouth at bedtime. 12/14/15   Abner Greenspan, MD  omeprazole (PRILOSEC) 40 MG capsule Take 1 capsule (40 mg total) by mouth daily. 12/14/15   Abner Greenspan, MD  propranolol ER (INDERAL LA) 120 MG 24 hr capsule Take 1 capsule (120 mg total) by mouth daily. 11/18/15   Pieter Partridge, DO  SUMAtriptan (IMITREX) 100 MG tablet Take 100 mg by mouth every 2 (two) hours as needed for migraine. May repeat in 2 hours if headache persists or recurs.    Historical Provider, MD  topiramate (TOPAMAX) 25 MG tablet Take 1 tablet at bedtime for 7 days, then increase to 2 tablets at bedtime 11/18/15   Pieter Partridge, DO  traMADol (ULTRAM) 50 MG tablet Take 1 tablet (50 mg total) by mouth every 6 (six)  hours as needed (pain). 11/18/15   Pieter Partridge, DO    Family History Family History  Problem Relation Age of Onset  . Anesthesia problems Mother   . Hypertension Mother   . Aneurysm Father   . Breast cancer Maternal Aunt 58  . Cancer Paternal Aunt     uterine CA  . Lupus Cousin   . Prostate cancer Brother   . Colon cancer Neg Hx   . Esophageal cancer Neg Hx   . Stomach cancer Neg Hx   . Rectal cancer Neg Hx     Social History Social History  Substance Use Topics  . Smoking status: Never Smoker  . Smokeless tobacco: Never Used  . Alcohol use No     Allergies   Banana and Morphine   Review of Systems Review of Systems  All other systems reviewed and are negative.    Physical Exam Updated Vital Signs BP 127/71 (BP Location: Left Arm)   Pulse 80   Temp 98.4 F (36.9 C) (Oral)   Resp 18   LMP 11/24/2010   SpO2 98%   Physical Exam  Constitutional: She is oriented to person, place, and time. She appears well-developed and well-nourished. No distress.  HENT:  Head: Normocephalic and atraumatic.  Right Ear: External ear normal.  Left Ear: External ear normal.  Nose: Nose normal.  Mouth/Throat: Uvula is midline, oropharynx is clear and moist and mucous membranes are normal. Mucous membranes are not pale, not dry and not cyanotic. No trismus in the jaw. Abnormal dentition. Dental caries present. No dental abscesses, uvula swelling or lacerations. No oropharyngeal exudate, posterior oropharyngeal edema, posterior oropharyngeal erythema or tonsillar abscesses.    Left cheek and face tender to palpation along cheek bone and mandible, no edema, induration or erythema of left cheek No sublingual tenderness No submental, submandibular or subtonsillar lymphadenopathy No trismus Uvula midling, OP normal  Eyes: Conjunctivae and EOM are normal. Pupils are equal, round, and reactive to light. Right eye exhibits no discharge. Left eye exhibits no discharge. No scleral  icterus.  Neck: Normal range of motion. Neck supple. No JVD present. No tracheal deviation present.  Cardiovascular: Normal rate and regular rhythm.   Pulmonary/Chest: Effort normal and breath sounds normal. No stridor. No respiratory distress.  Musculoskeletal: Normal range of motion. She exhibits no edema.  Lymphadenopathy:       Head (right side): No submental, no submandibular, no tonsillar, no preauricular, no posterior auricular and no occipital adenopathy present.       Head (  left side): No submental, no submandibular, no tonsillar, no preauricular, no posterior auricular and no occipital adenopathy present.    She has no cervical adenopathy.  Neurological: She is alert and oriented to person, place, and time. She exhibits normal muscle tone. Coordination normal.  Skin: Skin is warm and dry. No rash noted. She is not diaphoretic. No erythema. No pallor.  Psychiatric: She has a normal mood and affect. Her behavior is normal. Judgment and thought content normal.  Nursing note and vitals reviewed.    ED Treatments / Results  Labs (all labs ordered are listed, but only abnormal results are displayed) Labs Reviewed - No data to display  EKG  EKG Interpretation None       Radiology No results found.  Procedures Procedures (including critical care time)  Medications Ordered in ED Medications  penicillin v potassium (VEETID) tablet 500 mg (500 mg Oral Given 01/17/16 0250)  oxyCODONE-acetaminophen (PERCOCET/ROXICET) 5-325 MG per tablet 2 tablet (2 tablets Oral Given 01/17/16 0250)     Initial Impression / Assessment and Plan / ED Course  I have reviewed the triage vital signs and the nursing notes.  Pertinent labs & imaging results that were available during my care of the patient were reviewed by me and considered in my medical decision making (see chart for details).  Clinical Course   Patient with toothache.  No gross abscess.  Exam unconcerning for Ludwig's angina or  spread of infection.  Will treat with penicillin and pain medicine.  Urged patient to follow-up with dentist.     Final Clinical Impressions(s) / ED Diagnoses   Final diagnoses:  Pain, dental    New Prescriptions Discharge Medication List as of 01/17/2016  2:58 AM    START taking these medications   Details  penicillin v potassium (VEETID) 500 MG tablet Take 1 tablet (500 mg total) by mouth 4 (four) times daily., Starting Sun 01/17/2016, Print    !! traMADol (ULTRAM) 50 MG tablet Take 1 tablet (50 mg total) by mouth every 12 (twelve) hours as needed for severe pain., Starting Sun 01/17/2016, Print     !! - Potential duplicate medications found. Please discuss with provider.       Delsa Grana, PA-C 02/06/16 [place], MD 02/10/16 [phone]

## 2016-01-17 NOTE — ED Notes (Signed)
Patient requesting to speak to the PA because they want their prescription change. PA notified.

## 2016-01-17 NOTE — Discharge Instructions (Addendum)
See your dentist ASAP  RESOURCE GUIDE  Chronic Pain Problems: Contact [facility]  [phone] Patients need to be referred by their primary care doctor.  Insufficient Money for Medicine: Contact [employer]:  call "211" or [place] [phone].  No Primary Care Doctor: Call Health Connect  [phone] - can help you locate a primary care doctor that  accepts your insurance, provides certain services, etc. Physician Referral Service- [phone]  Agencies that provide inexpensive medical care: [facility]  [facility]  [phone] [facility]  [phone] [facility]  [phone] [facility]  [phone] [facility]  [phone]  [place] Providers: [facility]- [address]  [phone], Mon-Fri 9am-7pm, Sat 9am-1pm [place], [address] [place], [address] [place]  [place]- [address]  [place], [address], [phone]  Only accepts [place] Access [place] patients after they have their name  applied to their card  Self Pay (no insurance) in [facility]: Sickle Cell Patients: Dr Kevan Ny, [facility]  [place], [facility]- [place]  [facility]- [address], [facility]- see information above (Speak to [employer] if you do not have insurance)       -  [facility]- [place], [place],  [place], [place]  Dr Vista Lawman-  [address], [place], [facility]- [address], [phone]       -  [facility]- [address], [place], also [address] [phone]       -    [facility]- [address], [place], 1st & 3rd Saturday   every month, 10am-1pm  1) Find a Doctor and Pay Out of Pocket Although you won't have to find out who is covered by your insurance plan, it is a good idea to ask around and get recommendations. You will then need to call the office and see if the doctor you have chosen will accept you as a new patient and what types of options they offer for patients who are self-pay. Some doctors offer discounts or will set up payment plans for their patients who do not have insurance, but you will need to ask so you aren't surprised when you get to your appointment.  2) Contact Your Local Health Department Not all health departments have doctors that can see patients for sick visits, but many do, so it is worth a call to see if yours does. If you don't know where your local health department is, you can check in your phone book. The CDC also has a tool to help you locate your state's health department, and many state websites also have listings of all of their local health departments.  3)  Find a [facility] If your illness is not likely to be very severe or complicated, you may want to try a walk in clinic. These are popping up all over the country in pharmacies, drugstores, and shopping centers. They're usually staffed by nurse practitioners or physician assistants that have been trained to treat common illnesses and complaints. They're usually fairly quick and inexpensive. However, if you have serious medical issues or chronic medical problems, these are probably not your best option  [facility] [place], [facility], [address] [place], phone [phone] or [phone].  Monday - Friday, call for an appointment. [place], [facility], [address], [place], phone [phone] or [phone].  Monday - Friday, call for an appointment.  Abuse/Neglect: [place] [phone] [place] [phone] (After Hours)  Emergency Shelter:  [facility] [phone]  Maternity Homes: [facility] at the [place] [phone] [place] [phone]  MRSA Hotline #:   364-212-2881  [facility] Dept. [address]         [place] Phone:  [phone]                                  Phone:  [phone]                   Phone:  [phone]  [facility], [place]- [phone]       -     [facility] in [place], [address]                                  [place] [phone] or [phone] (After Hours)   [place]  Substance Abuse Resources: Alcohol and Drug Services  4454686473 [place] [phone] [facility] [place] Chinita Pester [phone] Residential & Outpatient Substance Abuse Program  [phone]  Psychological Services: [place]  [phone] [place]  [place], [place]. [address] [place], [place]: [phone] or [phone], [email or web address]  Dental Assistance  If unable to pay or uninsured, contact:  [facility] or [facility]. to become qualified for the adult dental clinic.  Patients with Medicaid: [facility] [phone] [place], [place] [address], [phone]  If unable to pay, or uninsured, contact [facility] [phone]) or [place] [phone] in [place], [place] in [place]) to become qualified for the adult  dental clinic  Other [place]: [facility]- [place], [place], [place], [address], [place], [place], 2nd and 4th Thursday of the month at 6:30am.  10 clients each day by appointment, can sometimes see walk-in patients if someone does not show for an appointment. [facility]- [address] Hillard Danker [place], [place], [address], [place], [place], [place], [address], [place] [facility] [phone]  Please make every effort to establish with a primary care physician for routine medical care  [place]  The [place] provides a wide range of adult health services. Some of these services are designed to address the healthcare needs of all [facility] residents and all services are designed to meet the needs of uninsured/underinsured low income residents. Some services are available to any resident of [place], call [phone] for details. ] The [facility], a new medical clinic for adults, is now open. For more information about the Center and its services please call [phone]. For information on our [place] services, click here.  For more information on any of the following Department of Public Health programs, including hours of service, click on the highlighted link.  SERVICES FOR WOMEN (Adults and Teens) [employer] provide a full range of birth control options plus education and counseling. New patient visit and annual return visits  include a complete examination, pap test as indicated, and other laboratory as indicated. Included is our [employer] for men.  Maternity Care is provided through pregnancy, including a six week post partum exam. Women who meet eligibility criteria for the Medicaid for Pregnant Women program, receive care free. Other women are charged on a sliding scale according to income. Note: [facility] provides services to pregnant women who have a Medicaid card. Call [phone] for an appointment in [place] or [phone] for an appointment in [facility].  Primary Care for Medicaid [facility] Access Women is available through the [place]. As primary care provider for the [place] program, women may designate the [place] clinic as their primary care provider.  PLEASE CALL R5958090 FOR AN APPOINTMENT FOR THE ABOVE SERVICES IN EITHER [place] OR [place]. Information available in [place] and [place].   Childbirth Education Classes are open to the public and offered to help families prepare for the best possible childbirth experience as well as to promote lifelong health and wellness. Classes are offered throughout the year and meet on the same night once a week for five weeks. Medicaid covers the cost of the classes for the mother-to-be and her partner. For participants without Medicaid, the cost of the class series is $45.00 for the mother-to-be and her partner. Class size is limited and registration is required. For more information or to register call [phone]. Baby items donated by [employer] and the [employer] [place] are given away during each class series.  SERVICES FOR WOMEN AND MEN Sexually Transmitted Infection appointments, including HIV testing, are available daily (weekdays, except holidays). Call early as same-day appointments are limited. For an appointment in either  [facility] or [place], call [phone]. Services are confidential and free of charge.  Skin Testing for Tuberculosis Please call [phone]. Adult Immunizations are available, usually for a fee. Please call [phone] for details.  PLEASE CALL [phone] FOR  AN APPOINTMENT FOR THE ABOVE SERVICES IN EITHER [place] OR [place].   [facility] provides up to the minute recommended vaccines for your travel destination. We also provide essential health and political information to help insure a safe and pleasurable travel experience. This program is self-sustaining, however, fees are very competitive. We are a CERTIFIED YELLOW FEVER IMMUNIZATION approved clinic site. PLEASE CALL [phone] FOR AN APPOINTMENT IN EITHER [place] OR [place].   If you have questions about the services listed above, we want to answer them! Email [place] at: [email or web address] Home Visiting Services for elderly and the disabled are available to residents of [facility] who are in need of care that compares to the care offered by a nursing home, have needs that can be met by the program, and have [place] Medicaid. Other short term services are available to residents 18 years and older who are unable to meet requirements for eligibility to receive services from a certified home health agency, spend the majority of time at home, and need care for six months or less.  PLEASE CALL [id number] OR [phone] FOR MORE INFORMATION. Medication Assistance Program serves as a link between pharmaceutical companies and patients to provide low cost or free prescription medications. This servce is available for residents who meet certain income restrictions and have no insurance coverage.  PLEASE CALL [phone] ([place]) OR [phone] ([place]) FOR MORE INFORMATION.  Updated Feb. 21, 2013

## 2016-02-10 ENCOUNTER — Telehealth: Payer: Self-pay | Admitting: *Deleted

## 2016-02-10 MED ORDER — FLUCONAZOLE 150 MG PO TABS: 150.0000 mg | ORAL_TABLET | Freq: Once | ORAL | 0 refills | Status: AC

## 2016-02-10 NOTE — Telephone Encounter (Signed)
Pt was prescribed antibiotic by her dentist now has yeast infection, vaginal itching, asked if you would be so kind to send Rx for this without office visit? Please advise

## 2016-02-10 NOTE — Telephone Encounter (Signed)
Okay for Diflucan 150 mg 1 dose 

## 2016-02-10 NOTE — Telephone Encounter (Signed)
Pt informed with the below note, Rx sent. 

## 2016-03-04 ENCOUNTER — Ambulatory Visit (INDEPENDENT_AMBULATORY_CARE_PROVIDER_SITE_OTHER): Payer: 59 | Admitting: Family Medicine

## 2016-03-04 ENCOUNTER — Encounter: Payer: Self-pay | Admitting: Family Medicine

## 2016-03-04 VITALS — BP 120/70 | HR 80 | Resp 12 | Ht 62.5 in | Wt 158.2 lb

## 2016-03-04 DIAGNOSIS — S29012A Strain of muscle and tendon of back wall of thorax, initial encounter: Secondary | ICD-10-CM

## 2016-03-04 DIAGNOSIS — M549 Dorsalgia, unspecified: Secondary | ICD-10-CM | POA: Diagnosis not present

## 2016-03-04 MED ORDER — TRAMADOL HCL 50 MG PO TABS: 50.0000 mg | ORAL_TABLET | Freq: Three times a day (TID) | ORAL | 0 refills | Status: AC | PRN

## 2016-03-04 MED ORDER — METHOCARBAMOL 500 MG PO TABS: 500.0000 mg | ORAL_TABLET | Freq: Three times a day (TID) | ORAL | 1 refills | Status: AC | PRN

## 2016-03-04 MED ORDER — KETOROLAC TROMETHAMINE 60 MG/2ML IM SOLN
60.0000 mg | Freq: Once | INTRAMUSCULAR | Status: AC
  Administered 2016-03-04: 60 mg via INTRAMUSCULAR

## 2016-03-04 NOTE — Patient Instructions (Signed)
  [name] I have seen you today for an acute visit.  1. Upper back pain on left side   PT of chiropractor treatment might help. Range of motion exercises of left shoulder as tolerated. Methocarbamol and tramadol caused drowsiness among other side effects, please review information given at the pharmacy. Local heat. Avoid prolonged sitting or standing. Avoid after body exercises, as weight lifting, until completely recovered.  - methocarbamol (ROBAXIN) 500 MG tablet; Take 1 tablet (500 mg total) by mouth every 8 (eight) hours as needed for muscle spasms.  Dispense: 45 tablet; Refill: 1 - traMADol (ULTRAM) 50 MG tablet; Take 1 tablet (50 mg total) by mouth every 8 (eight) hours as needed.  Dispense: 15 tablet; Refill: 0    In general please monitor for signs of worsening symptoms and seek immediate medical attention if any concerning/warning symptom as we discussed.   If symptoms are not resolved in 2-3 weeks you should schedule a follow up appointment with your doctor, before if needed.  Please be sure you have an appointment already scheduled with your PCP before you leave today.

## 2016-03-04 NOTE — Progress Notes (Signed)
HPI:  ACUTE VISIT:  Chief Complaint  Patient presents with  . Flank Pain    left side, this AM.    [name] is a 50 y.o. female, who is here today with her husband complaining of left shoulder pain.  Today around 7:20 AM she while she was stretching she started with sudden pain on left upper back and shoulder. According to husband, a couple days ago she was doing pushups, 25 times at the time for a total of 100 on one arm at the time, she denies any injury at this time but states that she was having some soreness on upper back. She has not noted any numbness, tingling, skin rash, or deformity. She has limitation of movement of left shoulder due to pain. Pain is constant, sharp,exacerbated by movement, alleviated by rest, severe sometimes if she tries to move shoulder.  She took ibuprofen 800 mg about 5-6 hours ago, so pain is now better. According to patient, she is not supposed to take NSAIDs by mouth because history of GERD. She has taken Tramadol in the past for musculoskeletal pain, similar presentation but on right side.  Right handed.  Lab Results  Component Value Date   CREATININE 0.80 09/05/2015   BUN 12 09/05/2015   NA 135 09/05/2015   K 3.8 09/05/2015   CL 105 09/05/2015   CO2 24 09/05/2015     Review of Systems  Constitutional: Negative for activity change, appetite change, fatigue, fever and unexpected weight change.  HENT: Negative for mouth sores, sore throat and trouble swallowing.   Respiratory: Negative for cough, shortness of breath and wheezing.   Gastrointestinal: Negative for abdominal pain, nausea and vomiting.  Musculoskeletal: Positive for arthralgias, back pain and neck pain. Negative for joint swelling.  Skin: Negative for rash.  Neurological: Negative for tremors, weakness, numbness and headaches.  Hematological: Negative for adenopathy. Does not bruise/bleed easily.       Current Outpatient Prescriptions on File Prior to Visit    Medication Sig Dispense Refill  . propranolol ER (INDERAL LA) 120 MG 24 hr capsule Take 1 capsule (120 mg total) by mouth daily. 30 capsule 6  . SUMAtriptan (IMITREX) 100 MG tablet Take 100 mg by mouth every 2 (two) hours as needed for migraine. May repeat in 2 hours if headache persists or recurs.    . [DISCONTINUED] albuterol (PROAIR HFA) 108 (90 BASE) MCG/ACT inhaler Inhale 2 puffs into the lungs every 4 (four) hours as needed.       No current facility-administered medications on file prior to visit.      Past Medical History:  Diagnosis Date  . Allergy    allergic rhinitis  . Anemia    iron deficiency anemia  . 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 Complication of anesthesia    slow to awaken  . GERD (gastroesophageal reflux disease)   . Headache    post concussion  . Hiatal hernia   . HPV in female    also some abnormal paps in past  . Personal history of kidney stones   . PPD positive    Hx of  . Renal lithiasis 2013  . Sickle cell trait ([facility])    Allergies  Allergen Reactions  . Banana Itching    Throat itches  . Morphine Itching    Social History   Social History  . Marital status: Married    Spouse name: [name]  .  Number of children: 3  . Years of education: College   Occupational History  . [place] History Main Topics  . Smoking status: Never Smoker  . Smokeless tobacco: Never Used  . Alcohol use No  . Drug use: No  . Sexual activity: Yes    Partners: Male    Birth control/ protection: Surgical     Comment: 1st intercourse 50 yo-Fewer than 5 partners   Other Topics Concern  . None   Social History Narrative   Pt lives at home with her spouse.   Caffeine Use: 1-2 cups daily; sodas occasionally    Vitals:   03/04/16 1403  BP: 120/70  Pulse: 80  Resp: 12   Body mass index is 28.48 kg/m.   Physical Exam  Nursing note and vitals reviewed. Constitutional: She is oriented to person, place,  and time. She appears well-developed. She does not appear ill. No distress.  HENT:  Head: Atraumatic.  Eyes: Conjunctivae are normal.  Cardiovascular:  Pulses:      Radial pulses are 2+ on the left side.  Respiratory: Effort normal and breath sounds normal. No respiratory distress.  Musculoskeletal: She exhibits no edema.       Left shoulder: She exhibits decreased range of motion. She exhibits no tenderness, no swelling and no deformity.  No significant deformity appreciated. + moderate tenderness upon palpation of left trapezium muscle and left interscapular area, also paracervical muscles C5-6. Pain elicited with cervical and shoulder ROM. Active abduction left shoulder limited, passive ROM normal. No pain upon palpation. No local edema or erythema appreciated, no suspicious lesions.  Lymphadenopathy:    She has no cervical adenopathy.       Left: No supraclavicular adenopathy present.  Neurological: She is alert and oriented to person, place, and time. She has normal strength. No sensory deficit. Coordination and gait normal.  Skin: Skin is warm. No rash noted. No erythema.  Psychiatric: She has a normal mood and affect.  Well groomed, good eye contact.      ASSESSMENT AND PLAN:     Mairin was seen today for flank pain.  Diagnoses and all orders for this visit:  Upper back pain on left side -     methocarbamol (ROBAXIN) 500 MG tablet; Take 1 tablet (500 mg total) by mouth every 8 (eight) hours as needed for muscle spasms. -     traMADol (ULTRAM) 50 MG tablet; Take 1 tablet (50 mg total) by mouth every 8 (eight) hours as needed. -     ketorolac (TORADOL) injection 60 mg; Inject 2 mLs (60 mg total) into the muscle once.  Muscle strain of left upper back, initial encounter   I do not think imaging is needed today. Here in office Toradol 60 mg IM x 1 after verbal consent. Local heat, massage, and ROM exercises (cervical and shoulder) recommended. Some side effects of  muscle relaxant and Tramadol discussed. F/U with PCP as needed. Note for work given. No back to upper body wt lifting or resistance exercise until pain resolves.     Return if symptoms worsen or fail to improve, for with PCP  please.     -[name] was advised to return or notify a doctor immediately if symptoms worsen or persist or new concerns arise, she voices understanding.       Season G. Martinique, MD  [facility]. [place] office.

## 2016-03-04 NOTE — Progress Notes (Signed)
Pre visit review using our clinic review tool, if applicable. No additional management support is needed unless otherwise documented below in the visit note. 

## 2016-03-16 ENCOUNTER — Telehealth: Payer: Self-pay

## 2016-03-16 MED ORDER — FLUCONAZOLE 150 MG PO TABS: 150.0000 mg | ORAL_TABLET | Freq: Once | ORAL | 0 refills | Status: AC

## 2016-03-16 NOTE — Telephone Encounter (Signed)
Patient said back on 02/10/16 you sent her in a Diflucan tablet for yeast infection after antibiotics. She said she had to take the antibiotics again and now has yeast infection sx again. She asked if you would prescribe another Diflucan for her.

## 2016-03-16 NOTE — Telephone Encounter (Signed)
Rx sent and patient informed.  

## 2016-03-16 NOTE — Telephone Encounter (Signed)
Diflucan 150mg 1 dose

## 2016-03-18 ENCOUNTER — Telehealth: Payer: Self-pay | Admitting: Family Medicine

## 2016-03-18 NOTE — Telephone Encounter (Signed)
Patient Name: [name]  DOB: [birth year]    Initial Comment frequent urination, right side and lower back hurting, does have kidney stones   Nurse Assessment  Nurse: Raphael Gibney, RN, Vanita Ingles Date/Time (Eastern Time): 03/18/2016 11:14:36 AM  Confirm and document reason for call. If symptomatic, describe symptoms. ---Caller states she is having frequent urination. Having pain in her right side and lower back. Has kidney stones. Pain level 5-6 while walking. No blood.  Does the patient have any new or worsening symptoms? ---Yes  Will a triage be completed? ---Yes  Related visit to physician within the last 2 weeks? ---No  Does the PT have any chronic conditions? (i.e. diabetes, asthma, etc.) ---Yes  List chronic conditions. ---kidney stones  Is the patient pregnant or possibly pregnant? (Ask all females between the ages of 78-55) ---No  Is this a behavioral health or substance abuse call? ---No     Guidelines    Guideline Title Affirmed Question Affirmed Notes  Flank Pain MODERATE pain (e.g., interferes with normal activities or awakens from sleep)    Final Disposition User   See Physician within 24 Hours [place], Therapist, sports, [place]    Comments  No appts available at [facility]. Pt does not want to go to another office or go to urgent care. Please call pt back regarding appt.   Referrals  GO TO FACILITY REFUSED   Disagree/Comply: Disagree  Disagree/Comply Reason: Disagree with instructions

## 2016-03-18 NOTE — Telephone Encounter (Signed)
Pt does not want to go to ED; pt scheduled Sat Clinic 03/19/16 at 10:15 with Dr Lacinda Axon. If pt condition changes or worsens prior to appt pt will go to ED.

## 2016-03-19 ENCOUNTER — Ambulatory Visit (INDEPENDENT_AMBULATORY_CARE_PROVIDER_SITE_OTHER): Payer: 59 | Admitting: Family Medicine

## 2016-03-19 VITALS — BP 110/70 | HR 81 | Temp 97.7°F | Resp 17 | Ht 62.5 in | Wt 156.8 lb

## 2016-03-19 DIAGNOSIS — R39198 Other difficulties with micturition: Secondary | ICD-10-CM

## 2016-03-19 MED ORDER — CIPROFLOXACIN HCL 500 MG PO TABS: 500.0000 mg | ORAL_TABLET | Freq: Two times a day (BID) | ORAL | 0 refills | Status: DC

## 2016-03-19 MED ORDER — HYDROCODONE-ACETAMINOPHEN 5-325 MG PO TABS: 1.0000 | ORAL_TABLET | Freq: Four times a day (QID) | ORAL | 0 refills | Status: DC | PRN

## 2016-03-19 MED ORDER — TAMSULOSIN HCL 0.4 MG PO CAPS: 0.4000 mg | ORAL_CAPSULE | Freq: Every day | ORAL | 3 refills | Status: DC

## 2016-03-19 NOTE — Progress Notes (Signed)
Subjective:  Patient ID: [name], female    DOB: 01-Aug-1965  Age: 50 y.o. MRN: [id number]  CC: ? UTI vs stone  HPI:  50 year old female with a history of nephrolithiasis presents with urinary symptoms.  Patient states that for the past 2 days she's had intermittent frequency of urination followed by difficulty urinating. No reports of dysuria. No known exacerbating or relieving factors. She reports associated right lower quadrant pain as well as right sided back pain. She states that her pain is worse with ambulation/movement. No known relieving factors. No medications or interventions tried. No other complaints or concerns at this time.  Of note, patient has CT of her abdomen and pelvis in June which revealed several stones none of which were greater than 4 mm.  Social Hx   Social History   Social History  . Marital status: Married    Spouse name: [name]  . Number of children: 3  . Years of education: College   Occupational History  . [place] History Main Topics  . Smoking status: Never Smoker  . Smokeless tobacco: Never Used  . Alcohol use No  . Drug use: No  . Sexual activity: Yes    Partners: Male    Birth control/ protection: Surgical     Comment: 1st intercourse 50 yo-Fewer than 5 partners   Other Topics Concern  . Not on file   Social History Narrative   Pt lives at home with her spouse.   Caffeine Use: 1-2 cups daily; sodas occasionally    Review of Systems  Constitutional: Negative.   Gastrointestinal: Positive for abdominal pain.  Genitourinary: Positive for difficulty urinating and frequency.  Musculoskeletal: Positive for back pain.   Objective:  BP 110/70   Pulse 81   Temp 97.7 F (36.5 C) (Oral)   Resp 17   Ht 5' 2.5" (1.588 m)   Wt 156 lb 12 oz (71.1 kg)   LMP 11/24/2010   SpO2 98%   BMI 28.21 kg/m   BP/Weight 03/19/2016 03/04/2016 [id number]  Systolic BP [id number]  Diastolic BP 70 70 69  Wt. (Lbs) 156.75  158.25 -  BMI 28.21 28.48 -   Physical Exam  Constitutional: She is oriented to person, place, and time.  Well-appearing female in no acute distress.  Cardiovascular: Normal rate and regular rhythm.   Pulmonary/Chest: Effort normal and breath sounds normal.  Abdominal:  Soft. Slight tenderness in right lower quadrant. No rebound or guarding. CVA tenderness on the right.  Neurological: She is alert and oriented to person, place, and time.  Psychiatric: She has a normal mood and affect.  Vitals reviewed.   Lab Results  Component Value Date   WBC 9.3 09/05/2015   HGB 12.3 09/05/2015   HCT 35.9 (L) 09/05/2015   PLT 310 09/05/2015   GLUCOSE 99 09/05/2015   CHOL 205 (H) 12/14/2015   TRIG 96.0 12/14/2015   HDL 53.90 12/14/2015   LDLCALC 132 (H) 12/14/2015   ALT 17 09/05/2015   AST 18 09/05/2015   NA 135 09/05/2015   K 3.8 09/05/2015   CL 105 09/05/2015   CREATININE 0.80 09/05/2015   BUN 12 09/05/2015   CO2 24 09/05/2015   TSH 1.58 12/14/2015    Assessment & Plan:   Problem List Items Addressed This Visit    Difficulty urinating - Primary    New acute problem. Patient unable to void today while in the office. As a result no  urinalysis was able to be obtained. Stone versus urinary tract infection. More likely UTI based off symptoms. Treating empirically for both entities with Flomax, Cipro, and PRN vicodin. If worsens will need to go the hospital.         Meds ordered this encounter  Medications  . omeprazole (PRILOSEC) 40 MG capsule    Sig: Take 40 mg by mouth daily.  . montelukast (SINGULAIR) 10 MG tablet    Sig: Take 10 mg by mouth at bedtime.  . traMADol (ULTRAM) 50 MG tablet    Sig: Take 50 mg by mouth every 6 (six) hours as needed (headache).  . ciprofloxacin (CIPRO) 500 MG tablet    Sig: Take 1 tablet (500 mg total) by mouth 2 (two) times daily.    Dispense:  14 tablet    Refill:  0  . tamsulosin (FLOMAX) 0.4 MG CAPS capsule    Sig: Take 1 capsule (0.4  mg total) by mouth daily.    Dispense:  30 capsule    Refill:  3  . HYDROcodone-acetaminophen (NORCO/VICODIN) 5-325 MG tablet    Sig: Take 1 tablet by mouth every 6 (six) hours as needed for moderate pain.    Dispense:  20 tablet    Refill:  0    Follow-up: PRN  [place]

## 2016-03-19 NOTE — Progress Notes (Signed)
Pre-visit discussion using our clinic review tool. No additional management support is needed unless otherwise documented below in the visit note.  

## 2016-03-19 NOTE — Patient Instructions (Signed)
Take the meds as prescribed.  If this is related to a stone, you should be able to pass given your CT scan results.  If you worsen go to the hospital.  Take care  Dr. Lacinda Axon

## 2016-03-19 NOTE — Assessment & Plan Note (Signed)
New acute problem. Patient unable to void today while in the office. As a result no urinalysis was able to be obtained. Stone versus urinary tract infection. More likely UTI based off symptoms. Treating empirically for both entities with Flomax, Cipro, and PRN vicodin. If worsens will need to go the hospital.

## 2016-03-31 ENCOUNTER — Encounter: Payer: Self-pay | Admitting: Family Medicine

## 2016-04-21 ENCOUNTER — Institutional Professional Consult (permissible substitution): Payer: 59 | Admitting: Family Medicine

## 2016-04-22 ENCOUNTER — Telehealth: Payer: Self-pay | Admitting: *Deleted

## 2016-04-22 DIAGNOSIS — Z1211 Encounter for screening for malignant neoplasm of colon: Secondary | ICD-10-CM

## 2016-04-22 NOTE — Telephone Encounter (Signed)
Appointment Request From: [name]    With Provider: Webb Silversmith, NP [[facility] [place]    Preferred Date Range: Any date 04/20/2016 or later    Preferred Times: Any    Reason: To address the following health maintenance concerns.  Colonoscopy    Comments:   Message was received from PT via MyChart requesting a colonoscopy. Please advise.

## 2016-04-22 NOTE — Telephone Encounter (Signed)
Referral done Will route to [facility] Thanks  

## 2016-04-25 ENCOUNTER — Ambulatory Visit (INDEPENDENT_AMBULATORY_CARE_PROVIDER_SITE_OTHER): Payer: 59 | Admitting: Family Medicine

## 2016-04-25 ENCOUNTER — Encounter: Payer: Self-pay | Admitting: Family Medicine

## 2016-04-25 VITALS — BP 110/70 | HR 78 | Temp 98.3°F | Ht 62.5 in | Wt 164.0 lb

## 2016-04-25 DIAGNOSIS — M67331 Transient synovitis, right wrist: Secondary | ICD-10-CM

## 2016-04-25 DIAGNOSIS — M25511 Pain in right shoulder: Secondary | ICD-10-CM

## 2016-04-25 DIAGNOSIS — M542 Cervicalgia: Secondary | ICD-10-CM | POA: Diagnosis not present

## 2016-04-25 MED ORDER — TRAMADOL HCL 50 MG PO TABS: 50.0000 mg | ORAL_TABLET | Freq: Four times a day (QID) | ORAL | 1 refills | Status: DC | PRN

## 2016-04-25 NOTE — Progress Notes (Signed)
Pre visit review using our clinic review tool, if applicable. No additional management support is needed unless otherwise documented below in the visit note. 

## 2016-04-25 NOTE — Progress Notes (Signed)
Dr. Frederico Hamman T. Dorise Gangi, MD, [facility] [place], [address] Phone: [phone] Fax: [phone]  04/25/2016  Patient: [name], MRN: [id number], DOB: November 24, 1965, 51 y.o.  Primary Physician:  Loura Pardon, MD   Chief Complaint  Patient presents with  . Neck Pain  . Wrist Pain    Right   Subjective:   [name] is a 51 y.o. very pleasant female patient who presents with the following:  R wrist -  Hard to turn and some pain in the wrist.  Synovitis R wrist.  She works all day at a computer kiosk No trauma or injury Mild dorsal swelling  Hard to reach around. Contstantly typing.  Some loss of IROM on the R compared to the L Ache in the shoulder blade  Past Medical History, Surgical History, Social History, Family History, Problem List, Medications, and Allergies have been reviewed and updated if relevant.  Patient Active Problem List   Diagnosis Date Noted  . Difficulty urinating 03/19/2016  . Hernia, internal 12/14/2015  . Colon cancer screening 12/14/2015  . Migraine without aura and without status migrainosus, not intractable 05/28/2015  . Routine general medical examination at a health care facility 11/15/2014  . Abdominal pain, unspecified site 12/25/2013  . 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 Family history of brain aneurysm 01/07/2013  . Eczema 12/21/2012  . Headache(784.0) 08/21/2012  . Allergic rhinitis 03/20/2009  . GERD 08/20/2008  . RENAL CALCULUS, HX OF 08/20/2008  . History of anemia 03/18/2008    Past Medical History:  Diagnosis Date  . Allergy    allergic rhinitis  . Anemia    iron deficiency anemia  . 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 Complication of anesthesia    slow to awaken  . GERD (gastroesophageal reflux disease)   . Headache    post concussion  . Hiatal hernia   . HPV in female    also some abnormal paps in past  . Personal history of kidney stones     . PPD positive    Hx of  . Renal lithiasis 2013  . Sickle cell trait [facility])     Past Surgical History:  Procedure Laterality Date  . APPENDECTOMY     1990's  . BREAST SURGERY     removal of L breast mass / benign /age [age]  . CESAREAN SECTION     x3  . CYSTOSCOPY  2008   L RPG, stone extraction ([place])  . ESOPHAGOGASTRODUODENOSCOPY  06/15/2012   Several----normal   . FOOT SURGERY  JULY 2013   RIGHT  . LAPAROSCOPIC VAGINAL HYSTERECTOMY  12/02/2010   menorrhagia, leiomyomata  . LAPAROSCOPY N/A 05/24/2012   Procedure: LAPAROSCOPY OPERATIVE;  Surgeon: Anastasio Auerbach, MD;  Location: [facility];  Service: Gynecology;  Laterality: N/A;  . PELVIC LAPAROSCOPY     x2 for cysts r/o endometriosis  . SALPINGOOPHORECTOMY Right 05/24/2012   Procedure: SALPINGO OOPHORECTOMY;  Surgeon: Anastasio Auerbach, MD;  Location: [facility];  Service: Gynecology;  Laterality: Right;  . TONSILLECTOMY AND ADENOIDECTOMY     age 65  . TUBAL LIGATION     BTL    Social History   Social History  . Marital status: Married    Spouse name: [name]  . Number of children: 3  . Years of education: College   Occupational History  . [place] History Main  Topics  . Smoking status: Never Smoker  . Smokeless tobacco: Never Used  . Alcohol use No  . Drug use: No  . Sexual activity: Yes    Partners: Male    Birth control/ protection: Surgical     Comment: 1st intercourse 51 yo-Fewer than 5 partners   Other Topics Concern  . Not on file   Social History Narrative   Pt lives at home with her spouse.   Caffeine Use: 1-2 cups daily; sodas occasionally    Family History  Problem Relation Age of Onset  . Anesthesia problems Mother   . Hypertension Mother   . Aneurysm Father   . Breast cancer Maternal Aunt 34  . Cancer Paternal Aunt     uterine CA  . Lupus Cousin   . Prostate cancer Brother   . Colon cancer Neg Hx   . Esophageal cancer Neg Hx   . Stomach cancer Neg Hx   .  Rectal cancer Neg Hx     Allergies  Allergen Reactions  . Banana Itching    Throat itches  . Morphine Itching    Medication list reviewed and updated in full in [place].  GEN: No fevers, chills. Nontoxic. Primarily MSK c/o today. MSK: Detailed in the HPI GI: tolerating PO intake without difficulty Neuro: No numbness, parasthesias, or tingling associated. Otherwise the pertinent positives of the ROS are noted above.   Objective:   BP 110/70   Pulse 78   Temp 98.3 F (36.8 C) (Oral)   Ht 5' 2.5" (1.588 m)   Wt 164 lb (74.4 kg)   LMP 11/24/2010   BMI 29.52 kg/m    GEN: WDWN, NAD, Non-toxic, Alert & Oriented x 3 HEENT: Atraumatic, Normocephalic.  Ears and Nose: No external deformity. EXTR: No clubbing/cyanosis/edema NEURO: Normal gait.  PSYCH: Normally interactive. Conversant. Not depressed or anxious appearing.  Calm demeanor.   Hand: r Ecchymosis or edema: neg ROM wrist/hand/digits/elbow: full  Carpals, MCP's, digits: mild fullness in radiocarpal joint Distal Ulna and Radius: NT Supination lift test: neg Ecchymosis or edema: neg Cysts/nodules: neg Finkelstein's test: neg Snuffbox tenderness: neg Scaphoid tubercle: NT Hook of Hamate: NT Resisted supination: NT Full composite fist Grip, all digits: 5/5 str No tenosynovitis Axial load test: mild pain Phalen's: neg Tinel's: neg Atrophy: neg  Hand sensation: intact   Shoulder: R Inspection: No muscle wasting or winging Ecchymosis/edema: neg  AC joint, scapula, clavicle: NT Cervical spine: NT, full ROM Spurling's: neg Abduction: full, 5/5 Flexion: full, 5/5 IR, loss of 45 deg compared to the L, lift-off: 5/5 ER at neutral: full, 5/5 AC crossover and compression: neg Neer: neg Hawkins: mild pos Drop Test: neg Empty Can: neg Supraspinatus insertion: NT Bicipital groove: NT Speed's: neg Yergason's: neg Sulcus sign: neg Scapular dyskinesis: none C5-T1 intact Sensation intact Grip 5/5    Radiology: No results found.   Assessment and Plan:   Transient synovitis of right wrist  Cervicalgia  Pain in joint of right shoulder  Reassurance with wrist. 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 Posterior capsular and scap stabilization reviewed.   Follow-up: prn  Meds ordered this encounter  Medications  . traMADol (ULTRAM) 50 MG tablet    Sig: Take 1 tablet (50 mg total) by mouth every 6 (six) hours as needed (shoulder pain).    Dispense:  40 tablet    Refill:  1  Medications Discontinued During This Encounter  Medication Reason  . tamsulosin (FLOMAX) 0.4 MG CAPS capsule Completed Course  . HYDROcodone-acetaminophen (NORCO/VICODIN) 5-325 MG tablet Completed Course  . ciprofloxacin (CIPRO) 500 MG tablet Completed Course  . traMADol (ULTRAM) 50 MG tablet Reorder   No orders of the defined types were placed in this encounter.   Signed,  Maud Deed. Naksh Radi, MD   Allergies as of 04/25/2016      Reactions   Banana Itching   Throat itches   Morphine Itching      Medication List       Accurate as of 04/25/16 11:59 PM. Always use your most recent med list.          montelukast 10 MG tablet Commonly known as:  SINGULAIR Take 10 mg by mouth at bedtime.   omeprazole 40 MG capsule Commonly known as:  PRILOSEC Take 40 mg by mouth daily.   propranolol ER 120 MG 24 hr capsule Commonly known as:  INDERAL LA Take 1 capsule (120 mg total) by mouth daily.   SUMAtriptan 100 MG tablet Commonly known as:  IMITREX Take 100 mg by mouth every 2 (two) hours as needed for migraine. May repeat in 2 hours if headache persists or recurs.   traMADol 50 MG tablet Commonly known as:  ULTRAM Take 1 tablet (50 mg total) by mouth every 6 (six) hours as needed (shoulder pain).

## 2016-04-29 NOTE — Telephone Encounter (Signed)
Called patient, she has to look at her schedule and will call [facility] directly.

## 2016-05-20 ENCOUNTER — Encounter: Payer: Self-pay | Admitting: Neurology

## 2016-05-20 ENCOUNTER — Ambulatory Visit (INDEPENDENT_AMBULATORY_CARE_PROVIDER_SITE_OTHER): Payer: 59 | Admitting: Neurology

## 2016-05-20 VITALS — BP 126/74 | HR 81 | Ht 62.5 in | Wt 164.8 lb

## 2016-05-20 DIAGNOSIS — G43009 Migraine without aura, not intractable, without status migrainosus: Secondary | ICD-10-CM

## 2016-05-20 MED ORDER — TRAMADOL HCL 50 MG PO TABS: 50.0000 mg | ORAL_TABLET | Freq: Four times a day (QID) | ORAL | 1 refills | Status: DC | PRN

## 2016-05-20 MED ORDER — PROPRANOLOL HCL ER 60 MG PO CP24: 120.0000 mg | ORAL_CAPSULE | Freq: Every day | ORAL | 5 refills | Status: DC
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
## 2016-05-20 NOTE — Patient Instructions (Signed)
1.  Continue propranolol.  Take two 60mg  capsules daily 2.  Sumatriptan or tramadol as needed (limited to no more than 2 days out of the week 3.  Follow up in 6 months.

## 2016-05-30 ENCOUNTER — Encounter: Payer: Self-pay | Admitting: Family Medicine

## 2016-06-22 ENCOUNTER — Encounter: Payer: 59 | Admitting: Gynecology

## 2016-06-28 ENCOUNTER — Ambulatory Visit (INDEPENDENT_AMBULATORY_CARE_PROVIDER_SITE_OTHER): Payer: 59 | Admitting: Gynecology

## 2016-06-28 ENCOUNTER — Encounter: Payer: Self-pay | Admitting: Gynecology

## 2016-06-28 VITALS — BP 124/80 | Ht 63.0 in | Wt 164.0 lb

## 2016-06-28 DIAGNOSIS — Z01419 Encounter for gynecological examination (general) (routine) without abnormal findings: Secondary | ICD-10-CM | POA: Diagnosis not present

## 2016-06-28 DIAGNOSIS — R1031 Right lower quadrant pain: Secondary | ICD-10-CM | POA: Diagnosis not present

## 2016-06-28 DIAGNOSIS — D171 Benign lipomatous neoplasm of skin and subcutaneous tissue of trunk: Secondary | ICD-10-CM | POA: Diagnosis not present

## 2016-06-28 DIAGNOSIS — G8929 Other chronic pain: Secondary | ICD-10-CM

## 2016-06-28 NOTE — Patient Instructions (Signed)
Follow the lipoma on your right shoulder. If it seems to be getting bigger than I would see a general surgeon to consider having it excised.  Follow up with a gastroenterologist for your colonoscopy as well as your right lower quadrant discomfort.

## 2016-06-28 NOTE — Progress Notes (Signed)
    [name] 1965-07-13 [id number]        51 y.o.  [id number] for annual exam.    Past medical history,surgical history, problem list, medications, allergies, family history and social history were all reviewed and documented as reviewed in the EPIC chart.  ROS:  Performed with pertinent positives and negatives included in the history, assessment and plan.   Additional significant findings :  None   Exam: Copywriter, advertising Vitals:   06/28/16 1411  BP: 124/80  Weight: 164 lb (74.4 kg)  Height: 5\' 3"  (1.6 m)   Body mass index is 29.05 kg/m.  General appearance:  Normal affect, orientation and appearance. Skin: Grossly normal. 2 cm classic lipoma posterior, upper right shoulder HEENT: Without gross lesions.  No cervical or supraclavicular adenopathy. Thyroid normal.  Lungs:  Clear without wheezing, rales or rhonchi Cardiac: RR, without RMG Abdominal:  Soft, nontender, without masses, guarding, rebound, organomegaly or hernia Breasts:  Examined lying and sitting without masses, retractions, discharge or axillary adenopathy.  Fullness right axilla consistent with her history of ectopic breast tissue   Pelvic:  Ext, BUS, Vagina with atrophic changes. Pap smear of cuff done   Adnexa: Without masses or tenderness    Anus and perineum: Normal   Rectovaginal: Normal sphincter tone without palpated masses or tenderness.    Assessment/Plan:  51 y.o. [id number] female for annual exam.   1. Perimenopausal. No significant hot flushes, night sweats, vaginal dryness.  Status post LAVH 11/2010. Subsequent RSO 2014. Continue monitor report any issues. 2. Chronic intermittent right lower quadrant pain. Pelvic ultrasound has been negative in the past. CT scan this past year for acute exacerbation showed no acute changes. No nausea vomiting diarrhea constipation. Status post RSO in the past. Suspect possibly related to adhesions from her prior surgeries. She is in the process of arranging for her  screening colonoscopy as she is trying 50 have asked her to follow up with the gastroenterologist in reference to this. I think if there is some question as far as surgery that the general surgeon should be the surgeon of choice and that there are no gynecologic organs left on the right to account for her pain and if they need to do a Tesio lysis involving the bowel they would be the better choice. Patient will follow up with her gastroenterologist and take a copy of her most recent CT scan with her. They did note "new unexpected location of the sigmoid colon right of the cecum compatible with pericecal hernia. No related obstruction or inflammation." Also noted nephrolithiasis nonobstructing. 3. Fullness right axilla with workup previously consistent with ectopic breast tissue. Saw a general surgeon who discussed excision versus observation and she is comfortable with observation. She notes it has not changed at all to self exam over the years. Coming due for mammography in August and I reminded her to schedule this. SBE monthly reviewed. 4. Pap smear 2016. Pap smear of vaginal cuff done today. Does have history of HPV related abnormal Pap smears a number of years ago with normal Paps since. 5. Health maintenance. No routine blood work done as patient reports this done elsewhere. Follow up 1 year, sooner as needed.    Anastasio Auerbach MD, 2:41 PM 06/28/2016

## 2016-06-28 NOTE — Addendum Note (Signed)
Addended by: Thurnell Garbe A on: 06/28/2016 02:50 PM   Modules accepted: Orders

## 2016-06-29 LAB — URINALYSIS W MICROSCOPIC + REFLEX CULTURE
Bacteria, UA: NONE SEEN [HPF]
Bilirubin Urine: NEGATIVE
Casts: NONE SEEN [LPF]
Crystals: NONE SEEN [HPF]
Glucose, UA: NEGATIVE
Hgb urine dipstick: NEGATIVE
Ketones, ur: NEGATIVE
Leukocytes, UA: NEGATIVE
Nitrite: NEGATIVE
Protein, ur: NEGATIVE
Specific Gravity, Urine: 1.015 (ref 1.001–1.035)
Yeast: NONE SEEN [HPF]
pH: 6 (ref 5.0–8.0)

## 2016-06-30 LAB — PAP IG W/ RFLX HPV ASCU

## 2016-07-01 LAB — URINE CULTURE

## 2016-07-11 ENCOUNTER — Encounter: Payer: Self-pay | Admitting: Family Medicine

## 2016-07-13 ENCOUNTER — Ambulatory Visit (INDEPENDENT_AMBULATORY_CARE_PROVIDER_SITE_OTHER): Payer: 59 | Admitting: Family Medicine

## 2016-07-13 ENCOUNTER — Encounter: Payer: Self-pay | Admitting: Family Medicine

## 2016-07-13 VITALS — BP 110/70 | HR 80 | Temp 98.4°F | Ht 63.0 in | Wt 165.2 lb

## 2016-07-13 DIAGNOSIS — M542 Cervicalgia: Secondary | ICD-10-CM

## 2016-07-13 DIAGNOSIS — M25511 Pain in right shoulder: Secondary | ICD-10-CM

## 2016-07-13 MED ORDER — GABAPENTIN 300 MG PO CAPS: 300.0000 mg | ORAL_CAPSULE | Freq: Three times a day (TID) | ORAL | 3 refills | Status: DC

## 2016-07-13 MED ORDER — TRAMADOL HCL 50 MG PO TABS: 50.0000 mg | ORAL_TABLET | Freq: Four times a day (QID) | ORAL | 1 refills | Status: DC | PRN

## 2016-07-13 MED ORDER — METHOCARBAMOL 500 MG PO TABS: 500.0000 mg | ORAL_TABLET | Freq: Three times a day (TID) | ORAL | 3 refills | Status: DC | PRN

## 2016-07-13 MED ORDER — METHOCARBAMOL 500 MG PO TABS
500.0000 mg | ORAL_TABLET | Freq: Three times a day (TID) | ORAL | 3 refills | Status: AC | PRN
Start: 2016-07-13 — End: 2016-07-23

## 2016-07-13 NOTE — Progress Notes (Signed)
Pre visit review using our clinic review tool, if applicable. No additional management support is needed unless otherwise documented below in the visit note.  Tramadol called into [facility] on [address].

## 2016-07-13 NOTE — Progress Notes (Signed)
Dr. Frederico Hamman T. Kateena Degroote, MD, [facility] [place], [address] Phone: [phone] Fax: [phone]  07/13/2016  Patient: [name], MRN: [id number], DOB: [birth year], 51 y.o.  Primary Physician:  Loura Pardon, MD   Chief Complaint  Patient presents with  . Neck Pain    Hard to turn head to the right   Subjective:   [name] is a 51 y.o. very pleasant female patient who presents with the following:  Patient is an intermittent neck and parascapular pain for multiple years now, she works at a [employer], advertising all the time. Previously she has done some physical therapy, which did provide some relief.  She is sustained a recent flare and is having more pain in recent weeks, particularly turning and bending her head to the right. She is having pain around in the trapezius region also.  She is currently not having any radiculopathy.  Past Medical History, Surgical History, Social History, Family History, Problem List, Medications, and Allergies have been reviewed and updated if relevant.  Patient Active Problem List   Diagnosis Date Noted  . Difficulty urinating 03/19/2016  . Hernia, internal 12/14/2015  . Colon cancer screening 12/14/2015  . Migraine without aura and without status migrainosus, not intractable 05/28/2015  . Routine general medical examination at a health care facility 11/15/2014  . Abdominal pain, unspecified site 12/25/2013  . 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 Family history of brain aneurysm 01/07/2013  . Eczema 12/21/2012  . Headache(784.0) 08/21/2012  . Allergic rhinitis 03/20/2009  . GERD 08/20/2008  . RENAL CALCULUS, HX OF 08/20/2008  . History of anemia 03/18/2008    Past Medical History:  Diagnosis Date  . Allergy    allergic rhinitis  . Anemia    iron deficiency anemia  . 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 Complication of anesthesia    slow to awaken  . GERD  (gastroesophageal reflux disease)   . Headache    post concussion  . Hiatal hernia   . HPV in female    also some abnormal paps in past  . Personal history of kidney stones   . PPD positive    Hx of  . Renal lithiasis 2013  . Sickle cell trait [facility])     Past Surgical History:  Procedure Laterality Date  . APPENDECTOMY     1990's  . BREAST SURGERY     removal of L breast mass / benign /age [age]  . CESAREAN SECTION     x3  . CYSTOSCOPY  2008   L RPG, stone extraction ([place])  . ESOPHAGOGASTRODUODENOSCOPY  06/15/2012   Several----normal   . FOOT SURGERY  JULY 2013   RIGHT  . LAPAROSCOPIC VAGINAL HYSTERECTOMY  12/02/2010   menorrhagia, leiomyomata  . LAPAROSCOPY N/A 05/24/2012   Procedure: LAPAROSCOPY OPERATIVE;  Surgeon: Anastasio Auerbach, MD;  Location: [facility];  Service: Gynecology;  Laterality: N/A;  . PELVIC LAPAROSCOPY     x2 for cysts r/o endometriosis  . SALPINGOOPHORECTOMY Right 05/24/2012   Procedure: SALPINGO OOPHORECTOMY;  Surgeon: Anastasio Auerbach, MD;  Location: [facility];  Service: Gynecology;  Laterality: Right;  . TONSILLECTOMY AND ADENOIDECTOMY     age 28  . TUBAL LIGATION     BTL    Social History   Social History  . Marital status: Married    Spouse name: [name]  . Number of children: 3  .  Years of education: College   Occupational History  . [place] History Main Topics  . Smoking status: Never Smoker  . Smokeless tobacco: Never Used  . Alcohol use No  . Drug use: No  . Sexual activity: Yes    Partners: Male    Birth control/ protection: Surgical     Comment: 1st intercourse 51 yo-Fewer than 5 partners   Other Topics Concern  . Not on file   Social History Narrative   Pt lives at home with her spouse.   Caffeine Use: 1-2 cups daily; sodas occasionally    Family History  Problem Relation Age of Onset  . Anesthesia problems Mother   . Hypertension Mother   . Aneurysm Father   . Breast cancer Maternal Aunt  87  . Cancer Paternal Aunt     uterine CA  . Lupus Cousin   . Prostate cancer Brother   . Colon cancer Neg Hx   . Esophageal cancer Neg Hx   . Stomach cancer Neg Hx   . Rectal cancer Neg Hx     Allergies  Allergen Reactions  . Banana Itching    Throat itches  . Morphine Itching    Medication list reviewed and updated in full in [place].  GEN: No fevers, chills. Nontoxic. Primarily MSK c/o today. MSK: Detailed in the HPI GI: tolerating PO intake without difficulty Neuro: No numbness, parasthesias, or tingling associated. Otherwise the pertinent positives of the ROS are noted above.   Objective:   BP 110/70   Pulse 80   Temp 98.4 F (36.9 C) (Oral)   Ht 5\' 3"  (1.6 m)   Wt 165 lb 4 oz (75 kg)   LMP 11/24/2010   BMI 29.27 kg/m    GEN: Well-developed,well-nourished,in no acute distress; alert,appropriate and cooperative throughout examination HEENT: Normocephalic and atraumatic without obvious abnormalities. Ears, externally no deformities PULM: Breathing comfortably in no respiratory distress EXT: No clubbing, cyanosis, or edema PSYCH: Normally interactive. Cooperative during the interview. Pleasant. Friendly and conversant. Not anxious or depressed appearing. Normal, full affect.  CERVICAL SPINE EXAM Range of motion: Flexion, extension, lateral bending, and rotation: loss of approximately 20 of flexion and extension. Lateral bending to the left has an approximate 30 loss of motion, and more on the right approximate 50% loss of motion. Right sided rotational loss of approximately 50% as well. Pain with terminal motion: yes Spinous Processes: NT SCM: NT Upper paracervical muscles: ttp Upper traps: ttp C5-T1 intact, sensation and motor   Radiology: No results found.  Assessment and Plan:   Cervicalgia  Pain in joint of right shoulder  We talked about various options, and for now refilled the patient's Robaxin, as well as tramadol.  Trial of  gabapentin, initially at only 300 milligrams at night, staggered increase.  Offered PT, but the patient would like to reduce some of her home exercises that she was taught in her prior PT session a few years ago.  Follow-up: No Follow-up on file.  Meds ordered this encounter  Medications  . DISCONTD: gabapentin (NEURONTIN) 300 MG capsule    Sig: Take 1 capsule (300 mg total) by mouth 3 (three) times daily.    Dispense:  90 capsule    Refill:  3  . DISCONTD: methocarbamol (ROBAXIN) 500 MG tablet    Sig: Take 1 tablet (500 mg total) by mouth every 8 (eight) hours as needed for muscle spasms.    Dispense:  40 tablet  Refill:  3  . traMADol (ULTRAM) 50 MG tablet    Sig: Take 1 tablet (50 mg total) by mouth every 6 (six) hours as needed.    Dispense:  40 tablet    Refill:  1  . methocarbamol (ROBAXIN) 500 MG tablet    Sig: Take 1 tablet (500 mg total) by mouth every 8 (eight) hours as needed for muscle spasms.    Dispense:  40 tablet    Refill:  3  . gabapentin (NEURONTIN) 300 MG capsule    Sig: Take 1 capsule (300 mg total) by mouth 3 (three) times daily.    Dispense:  90 capsule    Refill:  3   Medications Discontinued During This Encounter  Medication Reason  . traMADol (ULTRAM) 50 MG tablet Reorder  . methocarbamol (ROBAXIN) 500 MG tablet Reorder  . gabapentin (NEURONTIN) 300 MG capsule Reorder   Signed,  Frederico Hamman T. Ebon Ketchum, MD   Allergies as of 07/13/2016      Reactions   Banana Itching   Throat itches   Morphine Itching      Medication List       Accurate as of 07/13/16 11:59 PM. Always use your most recent med list.          gabapentin 300 MG capsule Commonly known as:  NEURONTIN Take 1 capsule (300 mg total) by mouth 3 (three) times daily.   methocarbamol 500 MG tablet Commonly known as:  ROBAXIN Take 1 tablet (500 mg total) by mouth every 8 (eight) hours as needed for muscle spasms.   montelukast 10 MG tablet Commonly known as:  SINGULAIR Take 10  mg by mouth at bedtime.   omeprazole 40 MG capsule Commonly known as:  PRILOSEC Take 40 mg by mouth daily.   propranolol ER 60 MG 24 hr capsule Commonly known as:  INDERAL LA Take 2 capsules (120 mg total) by mouth daily.   SUMAtriptan 100 MG tablet Commonly known as:  IMITREX Take 100 mg by mouth every 2 (two) hours as needed for migraine. May repeat in 2 hours if headache persists or recurs.   traMADol 50 MG tablet Commonly known as:  ULTRAM Take 1 tablet (50 mg total) by mouth every 6 (six) hours as needed.

## 2016-08-25 ENCOUNTER — Telehealth: Payer: Self-pay | Admitting: Neurology

## 2016-08-25 NOTE — Telephone Encounter (Signed)
Spoke with patient. Made aware we do not have forms scanned in the system to look at with her. They were just completed a week ago per patient.  She will drop off her copy to [place] tomorrow and let us know what needs "re-worded".

## 2016-08-25 NOTE — Telephone Encounter (Signed)
PT called and said the FMLA paperwork that was filled out was not worded right on question #1 and #8, please cal PT to clarify/Dawn

## 2016-08-30 ENCOUNTER — Other Ambulatory Visit: Payer: Self-pay | Admitting: Neurology

## 2016-09-07 NOTE — Telephone Encounter (Signed)
Dr Tomi Likens handed me forms today to fax back to [place]    Faxed to [phone] today Will leave the forms above the nursing desk in case they didn't receive them for any reason  Received confirmation that fax went through

## 2016-09-12 ENCOUNTER — Ambulatory Visit (INDEPENDENT_AMBULATORY_CARE_PROVIDER_SITE_OTHER): Payer: 59 | Admitting: Internal Medicine

## 2016-09-12 ENCOUNTER — Encounter: Payer: Self-pay | Admitting: Internal Medicine

## 2016-09-12 VITALS — BP 128/72 | HR 83 | Temp 98.2°F | Wt 163.8 lb

## 2016-09-12 DIAGNOSIS — J301 Allergic rhinitis due to pollen: Secondary | ICD-10-CM

## 2016-09-12 DIAGNOSIS — R059 Cough, unspecified: Secondary | ICD-10-CM

## 2016-09-12 DIAGNOSIS — R05 Cough: Secondary | ICD-10-CM

## 2016-09-12 MED ORDER — MONTELUKAST SODIUM 10 MG PO TABS: 10.0000 mg | ORAL_TABLET | Freq: Every day | ORAL | 0 refills | Status: DC

## 2016-09-12 MED ORDER — HYDROCODONE-HOMATROPINE 5-1.5 MG/5ML PO SYRP: 5.0000 mL | ORAL_SOLUTION | Freq: Three times a day (TID) | ORAL | 0 refills | Status: DC | PRN

## 2016-09-12 NOTE — Progress Notes (Signed)
HPI  Pt presents to the clinic today with c/o headaches, ear pain, sore throat and cough. This started 3 days ago. The cough is non productive. She denies fever, chills or body aches. She has tried Ibuprofen with minimal relief. She has a history of seasonal allergies. She has had some sick contacts.  Review of Systems        Past Medical History:  Diagnosis Date  . Allergy    allergic rhinitis  . Anemia    iron deficiency anemia  . 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 Complication of anesthesia    slow to awaken  . GERD (gastroesophageal reflux disease)   . Headache    post concussion  . Hiatal hernia   . HPV in female    also some abnormal paps in past  . Personal history of kidney stones   . PPD positive    Hx of  . Renal lithiasis 2013  . Sickle cell trait (HCC)     Family History  Problem Relation Age of Onset  . Anesthesia problems Mother   . Hypertension Mother   . Aneurysm Father   . Breast cancer Maternal Aunt 34  . Cancer Paternal Aunt        uterine CA  . Lupus Cousin   . Prostate cancer Brother   . Colon cancer Neg Hx   . Esophageal cancer Neg Hx   . Stomach cancer Neg Hx   . Rectal cancer Neg Hx     Social History   Social History  . Marital status: Married    Spouse name: [name]  . Number of children: 3  . Years of education: College   Occupational History  . [place] History Main Topics  . Smoking status: Never Smoker  . Smokeless tobacco: Never Used  . Alcohol use No  . Drug use: No  . Sexual activity: Yes    Partners: Male    Birth control/ protection: Surgical     Comment: 1st intercourse 51 yo-Fewer than 5 partners   Other Topics Concern  . Not on file   Social History Narrative   Pt lives at home with her spouse.   Caffeine Use: 1-2 cups daily; sodas occasionally    Allergies  Allergen Reactions  . Banana Itching    Throat itches  . Morphine Itching     Constitutional:  Positive headache. Denies fatigue, fever or abrupt weight changes.  HEENT:  Positive ear pain, sore throat. Denies eye redness, eye pain, pressure behind the eyes, facial pain, nasal congestion, ringing in the ears, wax buildup, runny nose or bloody nose. Respiratory: Positive cough. Denies difficulty breathing or shortness of breath.  Cardiovascular: Denies chest pain, chest tightness, palpitations or swelling in the hands or feet.   No other specific complaints in a complete review of systems (except as listed in HPI above).  Objective:   BP 128/72   Pulse 83   Temp 98.2 F (36.8 C) (Oral)   Wt 163 lb 12 oz (74.3 kg)   LMP 11/24/2010   SpO2 98%   BMI 29.01 kg/m  Wt Readings from Last 3 Encounters:  09/12/16 163 lb 12 oz (74.3 kg)  07/13/16 165 lb 4 oz (75 kg)  06/28/16 164 lb (74.4 kg)     General: Appears her stated age, in NAD. HEENT: Head: normal shape and size, no sinus tenderness noted; Ears: Tm's gray and intact, normal  light reflex; Throat/Mouth: + PND. Teeth present, mucosa erythematous and moist, no exudate noted, no lesions or ulcerations noted.  Neck: No cervical lymphadenopathy.  Pulmonary/Chest: Normal effort and positive vesicular breath sounds. No respiratory distress. No wheezes, rales or ronchi noted.       Assessment & Plan:  Allergic Rhinitis with Cough:  Get some rest and drink plenty of water Do salt water gargles for the sore throat Start Zyrtec daily Refilled Singulair Rx for Hycodan cough syrup  RTC as needed or if symptoms persist.   Webb Silversmith, NP

## 2016-09-12 NOTE — Patient Instructions (Signed)

## 2016-09-13 ENCOUNTER — Ambulatory Visit: Payer: Self-pay | Admitting: Family Medicine

## 2016-09-16 ENCOUNTER — Other Ambulatory Visit: Payer: Self-pay

## 2016-09-16 NOTE — Telephone Encounter (Signed)
Pt left v/m; pt seen 09/12/16 and pt is still coughing and cough med is almost gone. Pt wants to know if can get refill on hydrocodone homatropine. Last printed # 75 ml on 09/12/16.

## 2016-09-20 NOTE — Telephone Encounter (Signed)
No, she should try Delsym OTC

## 2016-09-21 NOTE — Telephone Encounter (Signed)
Pt is aware as instructed 

## 2016-10-06 ENCOUNTER — Encounter: Payer: Self-pay | Admitting: Family Medicine

## 2016-10-06 ENCOUNTER — Ambulatory Visit (INDEPENDENT_AMBULATORY_CARE_PROVIDER_SITE_OTHER): Payer: 59 | Admitting: Family Medicine

## 2016-10-06 ENCOUNTER — Ambulatory Visit (INDEPENDENT_AMBULATORY_CARE_PROVIDER_SITE_OTHER)
Admission: RE | Admit: 2016-10-06 | Discharge: 2016-10-06 | Disposition: A | Discharge status: Home / Self Care | Payer: 59 | Source: Ambulatory Visit | Attending: Family Medicine | Admitting: Family Medicine

## 2016-10-06 VITALS — BP 120/70 | HR 73 | Temp 98.2°F | Ht 63.0 in | Wt 165.8 lb

## 2016-10-06 DIAGNOSIS — M5412 Radiculopathy, cervical region: Secondary | ICD-10-CM

## 2016-10-06 MED ORDER — GABAPENTIN 300 MG PO CAPS: 300.0000 mg | ORAL_CAPSULE | Freq: Three times a day (TID) | ORAL | 3 refills | Status: DC

## 2016-10-06 MED ORDER — TRAMADOL HCL 50 MG PO TABS: 50.0000 mg | ORAL_TABLET | Freq: Four times a day (QID) | ORAL | 2 refills | Status: DC | PRN

## 2016-10-06 MED ORDER — DIAZEPAM 5 MG PO TABS: ORAL_TABLET | ORAL | 0 refills | Status: DC

## 2016-10-06 NOTE — Patient Instructions (Signed)
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
## 2016-10-19 ENCOUNTER — Inpatient Hospital Stay: Admission: RE | Admit: 2016-10-19 | Payer: 59 | Source: Ambulatory Visit

## 2016-10-19 ENCOUNTER — Other Ambulatory Visit: Payer: 59

## 2016-10-20 ENCOUNTER — Ambulatory Visit
Admission: RE | Admit: 2016-10-20 | Discharge: 2016-10-20 | Disposition: A | Discharge status: Home / Self Care | Payer: 59 | Source: Ambulatory Visit | Attending: Family Medicine | Admitting: Family Medicine

## 2016-10-20 DIAGNOSIS — M5412 Radiculopathy, cervical region: Secondary | ICD-10-CM

## 2016-10-25 ENCOUNTER — Telehealth: Payer: Self-pay | Admitting: Family Medicine

## 2016-10-25 DIAGNOSIS — M5030 Other cervical disc degeneration, unspecified cervical region: Secondary | ICD-10-CM

## 2016-10-25 DIAGNOSIS — M5020 Other cervical disc displacement, unspecified cervical region: Secondary | ICD-10-CM

## 2016-10-25 NOTE — Telephone Encounter (Signed)
-----   Message from Carter Kitten, [place] sent at 10/24/2016 11:45 AM EDT ----- Ms. Modena Nunnery notified as instructed by telephone.  She is agreeable with interventional spine specialist referral.  Will forward to Dr. Diona Browner to order referral since Dr. Lorelei Pont is out of the office.

## 2016-11-02 ENCOUNTER — Telehealth: Payer: Self-pay | Admitting: Neurology

## 2016-11-02 NOTE — Telephone Encounter (Signed)
Spoke with Pt, advised her refilling her Rx's will not be a problem since we had to move her appt. Advised her to have the pharmacy contact our office when refills are needed.

## 2016-11-02 NOTE — Telephone Encounter (Signed)
Patient's appointment had to be rescheduled from 11/18/16 due to Dr. Tomi Likens being out of the office. She is scheduled for 01/05/17 is there a way she can still get refills on her 2 medications until that visit? Please call. Thanks

## 2016-11-18 ENCOUNTER — Ambulatory Visit: Payer: 59 | Admitting: Neurology

## 2016-11-24 ENCOUNTER — Other Ambulatory Visit: Payer: Self-pay | Admitting: Family Medicine

## 2016-11-24 NOTE — Telephone Encounter (Signed)
Last office visit 10/06/2016.  Last refilled 10/06/2016 #50 with 2 refills.  Refill?

## 2016-11-24 NOTE — Telephone Encounter (Signed)
Tramadol called into [facility] [place], [address] Phone: [phone]

## 2016-11-24 NOTE — Telephone Encounter (Signed)
Ok to refill 50, 2 ref

## 2016-11-30 ENCOUNTER — Ambulatory Visit (INDEPENDENT_AMBULATORY_CARE_PROVIDER_SITE_OTHER): Payer: 59 | Admitting: Family Medicine

## 2016-11-30 ENCOUNTER — Telehealth: Payer: Self-pay | Admitting: Family Medicine

## 2016-11-30 ENCOUNTER — Encounter: Payer: Self-pay | Admitting: Family Medicine

## 2016-11-30 VITALS — BP 116/68 | HR 66 | Temp 98.0°F | Ht 63.0 in | Wt 161.2 lb

## 2016-11-30 DIAGNOSIS — R109 Unspecified abdominal pain: Secondary | ICD-10-CM

## 2016-11-30 DIAGNOSIS — K458 Other specified abdominal hernia without obstruction or gangrene: Secondary | ICD-10-CM

## 2016-11-30 LAB — COMPREHENSIVE METABOLIC PANEL
ALK PHOS: 57 U/L (ref 39–117)
ALT: 14 U/L (ref 0–35)
AST: 14 U/L (ref 0–37)
Albumin: 4.1 g/dL (ref 3.5–5.2)
BILIRUBIN TOTAL: 0.7 mg/dL (ref 0.2–1.2)
BUN: 7 mg/dL (ref 6–23)
CO2: 30 meq/L (ref 19–32)
CREATININE: 0.8 mg/dL (ref 0.40–1.20)
Calcium: 9.4 mg/dL (ref 8.4–10.5)
Chloride: 104 mEq/L (ref 96–112)
GFR: 97.27 mL/min (ref >60.00)
GLUCOSE: 97 mg/dL (ref 70–99)
Potassium: 3.9 mEq/L (ref 3.5–5.1)
Sodium: 139 mEq/L (ref 135–145)
TOTAL PROTEIN: 7 g/dL (ref 6.0–8.3)

## 2016-11-30 LAB — CBC WITH DIFFERENTIAL/PLATELET
BASOS ABS: 0 10*3/uL (ref 0.0–0.1)
Basophils Relative: 0.5 % (ref 0.0–3.0)
EOS ABS: 0.1 10*3/uL (ref 0.0–0.7)
Eosinophils Relative: 2.1 % (ref 0.0–5.0)
HCT: 39.2 % (ref 36.0–46.0)
Hemoglobin: 12.9 g/dL (ref 12.0–15.0)
LYMPHS ABS: 3.3 10*3/uL (ref 0.7–4.0)
LYMPHS PCT: 49.2 % — AB (ref 12.0–46.0)
MCHC: 32.9 g/dL (ref 30.0–36.0)
MCV: 80.7 fl (ref 78.0–100.0)
MONOS PCT: 9.1 % (ref 3.0–12.0)
Monocytes Absolute: 0.6 10*3/uL (ref 0.1–1.0)
NEUTROS PCT: 39.1 % — AB (ref 43.0–77.0)
Neutro Abs: 2.6 10*3/uL (ref 1.4–7.7)
Platelets: 354 10*3/uL (ref 150.0–400.0)
RBC: 4.85 Mil/uL (ref 3.87–5.11)
RDW: 14 % (ref 11.5–15.5)
WBC: 6.6 10*3/uL (ref 4.0–10.5)

## 2016-11-30 LAB — POCT URINALYSIS DIPSTICK
Bilirubin, UA: NEGATIVE
Glucose, UA: NEGATIVE
Ketones, UA: NEGATIVE
LEUKOCYTES UA: NEGATIVE
NITRITE UA: NEGATIVE
PH UA: 6 (ref 5.0–8.0)
PROTEIN UA: NEGATIVE
RBC UA: NEGATIVE
Spec Grav, UA: >=1.03 — AB (ref 1.010–1.025)
Urobilinogen, UA: 0.2 E.U./dL

## 2016-11-30 MED ORDER — CYCLOBENZAPRINE HCL 10 MG PO TABS: 5.0000 mg | ORAL_TABLET | Freq: Three times a day (TID) | ORAL | 1 refills | Status: DC | PRN

## 2016-11-30 NOTE — Progress Notes (Signed)
Subjective:    Patient ID: [name], female    DOB: [birth year], 51 y.o.   MRN: [id number]  HPI  Here for R flank pain and frequent urination   Started Monday  Frequency of urination all night  Then had some hesitancy (had to strain)  Then she developed R low abd pain that goes into her back   Results for orders placed or performed in visit on 11/30/16  Urinalysis Dipstick  Result Value Ref Range   Color, UA Yellow    Clarity, UA Clear    Glucose, UA Negative    Bilirubin, UA Negative    Ketones, UA Negative    Spec Grav, UA >=1.030 (A) 1.010 - 1.025   Blood, UA Negative    pH, UA 6.0 5.0 - 8.0   Protein, UA Negative    Urobilinogen, UA 0.2 0.2 or 1.0 E.U./dL   Nitrite, UA Negative    Leukocytes, UA Negative Negative     Hurts more to walk  Turning over in the bed hurts as well   ua is clear  slt concentrated   Drinks lots of fluids (little less the past few days)  No nausea Little light headed   Has a decent mattress/ a little old   Goes to the gym regularly  No no exercises (8 lb weights) - nothing new  No injury known   Does not have an appendix    She has a hx of renal stones  Wt Readings from Last 3 Encounters:  11/30/16 161 lb 4 oz (73.1 kg)  10/06/16 165 lb 12 oz (75.2 kg)  09/12/16 163 lb 12 oz (74.3 kg)   28.56 kg/m   Hx of R pericecal hernia 6/17 on CT -incidental  surg reviewed and did not think she would need f/u   Took a dose of tramadol - helped just a little  Avoids nsaids due to gerd   Some constipation -since July  She takes otc laxative (dulcolax)   Patient Active Problem List   Diagnosis Date Noted  . Right flank pain 11/30/2016  . Difficulty urinating 03/19/2016  . Hernia, abdominal 12/14/2015  . Colon cancer screening 12/14/2015  . Migraine without aura and without status migrainosus, not intractable 05/28/2015  . Routine general medical examination at a health care facility 11/15/2014  . Abdominal pain, unspecified  site 12/25/2013  . 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 Family history of brain aneurysm 01/07/2013  . Eczema 12/21/2012  . Headache(784.0) 08/21/2012  . Allergic rhinitis 03/20/2009  . GERD 08/20/2008  . RENAL CALCULUS, HX OF 08/20/2008  . History of anemia 03/18/2008   Past Medical History:  Diagnosis Date  . Allergy    allergic rhinitis  . Anemia    iron deficiency anemia  . 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 Complication of anesthesia    slow to awaken  . GERD (gastroesophageal reflux disease)   . Headache    post concussion  . Hiatal hernia   . HPV in female    also some abnormal paps in past  . Personal history of kidney stones   . PPD positive    Hx of  . Renal lithiasis 2013  . Sickle cell trait [facility])    Past Surgical History:  Procedure Laterality Date  . APPENDECTOMY     1990's  . BREAST SURGERY     removal of L breast mass / benign /age 36-20  . CESAREAN SECTION  x3  . CYSTOSCOPY  2008   L RPG, stone extraction ([place])  . ESOPHAGOGASTRODUODENOSCOPY  06/15/2012   Several----normal   . FOOT SURGERY  JULY 2013   RIGHT  . LAPAROSCOPIC VAGINAL HYSTERECTOMY  12/02/2010   menorrhagia, leiomyomata  . LAPAROSCOPY N/A 05/24/2012   Procedure: LAPAROSCOPY OPERATIVE;  Surgeon: Anastasio Auerbach, MD;  Location: [facility];  Service: Gynecology;  Laterality: N/A;  . PELVIC LAPAROSCOPY     x2 for cysts r/o endometriosis  . SALPINGOOPHORECTOMY Right 05/24/2012   Procedure: SALPINGO OOPHORECTOMY;  Surgeon: Anastasio Auerbach, MD;  Location: [facility];  Service: Gynecology;  Laterality: Right;  . TONSILLECTOMY AND ADENOIDECTOMY     age 61  . TUBAL LIGATION     BTL   Social History  Substance Use Topics  . Smoking status: Never Smoker  . Smokeless tobacco: Never Used  . Alcohol use No   Family History  Problem Relation Age of Onset  . Anesthesia problems Mother   . Hypertension Mother   . Aneurysm Father   . Breast cancer Maternal Aunt 75  . Cancer  Paternal Aunt        uterine CA  . Lupus Cousin   . Prostate cancer Brother   . Colon cancer Neg Hx   . Esophageal cancer Neg Hx   . Stomach cancer Neg Hx   . Rectal cancer Neg Hx    Allergies  Allergen Reactions  . Banana Itching    Throat itches  . Morphine Itching   Current Outpatient Prescriptions on File Prior to Visit  Medication Sig Dispense Refill  . gabapentin (NEURONTIN) 300 MG capsule Take 1 capsule (300 mg total) by mouth 3 (three) times daily. 90 capsule 3  . propranolol ER (INDERAL LA) 60 MG 24 hr capsule Take 2 capsules (120 mg total) by mouth daily. 60 capsule 5  . SUMAtriptan (IMITREX) 100 MG tablet Take 100 mg by mouth every 2 (two) hours as needed for migraine. May repeat in 2 hours if headache persists or recurs.    . traMADol (ULTRAM) 50 MG tablet TAKE 1 TABLET BY MOUTH EVERY 6 HOURS AS NEEDED 50 tablet 2  . [DISCONTINUED] albuterol (PROAIR HFA) 108 (90 BASE) MCG/ACT inhaler Inhale 2 puffs into the lungs every 4 (four) hours as needed.       No current facility-administered medications on file prior to visit.     Review of Systems Review of Systems  Constitutional: Negative for fever, appetite change, fatigue and unexpected weight change.  Eyes: Negative for pain and visual disturbance.  Respiratory: Negative for cough and shortness of breath.   Cardiovascular: Negative for cp or palpitations    Gastrointestinal: Negative for nausea, diarrhea and constipation.  Genitourinary: Negative for urgency and pos for  frequency. neg for dysuria or hematuria  Skin: Negative for pallor or rash   MSK pos for R flank pain  Neurological: Negative for weakness, light-headedness, numbness and headaches.  Hematological: Negative for adenopathy. Does not bruise/bleed easily.  Psychiatric/Behavioral: Negative for dysphoric mood. The patient is not nervous/anxious.         Objective:   Physical Exam  Constitutional: She appears well-developed and well-nourished. No  distress.  Well appearing   HENT:  Head: Normocephalic and atraumatic.  Mouth/Throat: Oropharynx is clear and moist.  Eyes: Pupils are equal, round, and reactive to light. Conjunctivae and EOM are normal. No scleral icterus.  Neck: Normal range of motion. Neck supple. No JVD present. No thyromegaly present.  Cardiovascular:  Normal rate, regular rhythm and normal heart sounds.   Pulmonary/Chest: Effort normal and breath sounds normal. No respiratory distress. She has no wheezes. She has no rales.  Abdominal: Soft. Bowel sounds are normal. She exhibits no distension and no mass. There is no hepatosplenomegaly. There is tenderness in the right lower quadrant. There is no rigidity, no rebound, no guarding, no CVA tenderness, no tenderness at McBurney's point and negative Murphy's sign.  Tender in RLQ and R lateral abdomen- mild w/o rebound or guarding  No M noted No cva tenderness    Musculoskeletal:       Right shoulder: She exhibits normal pulse and normal strength.  No spinal tenderness Tender in R lumbar musculature and R piriformis area as well as R abd musculature   R slr causes R buttock pain  Pain to flex past 90 deg No pain on ext  Pain on lateral bend both directions Nl gait No neuro changes   Lymphadenopathy:    She has no cervical adenopathy.       Right: No inguinal adenopathy present.       Left: No inguinal adenopathy present.  Neurological: She displays no atrophy. No sensory deficit. She exhibits normal muscle tone.  Skin: Skin is warm and dry. No rash noted. No erythema. No pallor.  Psychiatric: She has a normal mood and affect.  Nl affect           Assessment & Plan:   Problem List Items Addressed This Visit      Other   Hernia, abdominal    Hx of peri cecal hernia found incidentally on CT last year  Watching closely in light of R flank pain       Right flank pain - Primary    Per differential suspect musculoskeletal since it is positional and worsens  with movement  Urinary cause and known pericecal hernia are also in the differential  Px flexeril /inst to try warm compresses Lab today  u cx sent  Update if not starting to improve in a week or if worsening  (consider CT/ surg eval if no imp) in light of hernia hx       Relevant Orders   Urinalysis Dipstick (Completed)   Urine Culture   CBC with Differential/Platelet (Completed)   Comprehensive metabolic panel (Completed)

## 2016-11-30 NOTE — Telephone Encounter (Signed)
Pt has appt 11/30/16 at 12:30 with Dr Glori Bickers.

## 2016-11-30 NOTE — Telephone Encounter (Signed)
Will see her then 

## 2016-11-30 NOTE — Telephone Encounter (Signed)
Patient Name: [name]  DOB: 06-25-1965    Initial Comment right side hurts, pain goes around to back, frequent urination, at times not much comes out, hard to walk, history of kidney stones    Nurse Assessment  Nurse: Leilani Merl, RN, Nira Conn Date/Time (Eastern Time): 11/30/2016 8:37:19 AM  Confirm and document reason for call. If symptomatic, describe symptoms. ---Caller states that her right side hurts, pain goes around to back, frequent urination, at times not much comes out, hard to walk, history of kidney stones  Does the patient have any new or worsening symptoms? ---Yes  Will a triage be completed? ---Yes  Related visit to physician within the last 2 weeks? ---No  Does the PT have any chronic conditions? (i.e. diabetes, asthma, etc.) ---Yes  List chronic conditions. ---kidney stones  Is the patient pregnant or possibly pregnant? (Ask all females between the ages of 52-55) ---No  Is this a behavioral health or substance abuse call? ---No     Guidelines    Guideline Title Affirmed Question Affirmed Notes  Flank Pain MODERATE pain (e.g., interferes with normal activities or awakens from sleep)    Final Disposition User   See Physician within 24 Hours [place], RN, Water quality scientist    Comments  Appt with Dr. Glori Bickers today at 1230.   Referrals  REFERRED TO PCP OFFICE   Disagree/Comply: Comply

## 2016-11-30 NOTE — Patient Instructions (Signed)
Try miralax with your coffee once daily to get bowels moving better  If you see blood in stool let us know   Labs today  We will send urine for a culture  Keep drinking water   Try the flexeril (muscle relaxer) for your side pain with caution of sedation  Use a warm compress when you can  Tylenol is ok to take for pain   If no improvement -we may proceed with another CT scan and/or surgical hernia   If your suddenly worsen alert [place] and get to the ER

## 2016-12-01 NOTE — Assessment & Plan Note (Signed)
Per differential suspect musculoskeletal since it is positional and worsens with movement  Urinary cause and known pericecal hernia are also in the differential  Px flexeril /inst to try warm compresses Lab today  u cx sent  Update if not starting to improve in a week or if worsening  (consider CT/ surg eval if no imp) in light of hernia hx

## 2016-12-01 NOTE — Assessment & Plan Note (Signed)
Hx of peri cecal hernia found incidentally on CT last year  Watching closely in light of R flank pain

## 2016-12-02 LAB — URINE CULTURE

## 2016-12-20 ENCOUNTER — Telehealth: Payer: Self-pay | Admitting: *Deleted

## 2016-12-20 NOTE — Telephone Encounter (Signed)
Patient called stating she would like to have hormone levels checked, I advised pt to schedule OV with Dr.Fontaine, pt will call back to schedule.

## 2017-01-05 ENCOUNTER — Ambulatory Visit (INDEPENDENT_AMBULATORY_CARE_PROVIDER_SITE_OTHER): Payer: 59 | Admitting: Neurology

## 2017-01-05 ENCOUNTER — Encounter: Payer: Self-pay | Admitting: Neurology

## 2017-01-05 ENCOUNTER — Ambulatory Visit: Payer: 59 | Admitting: Neurology

## 2017-01-05 VITALS — BP 118/74 | HR 80 | Ht 63.0 in | Wt 162.2 lb

## 2017-01-05 DIAGNOSIS — R42 Dizziness and giddiness: Secondary | ICD-10-CM | POA: Diagnosis not present

## 2017-01-05 DIAGNOSIS — G43009 Migraine without aura, not intractable, without status migrainosus: Secondary | ICD-10-CM

## 2017-01-05 MED ORDER — SUMATRIPTAN SUCCINATE 100 MG PO TABS: ORAL_TABLET | ORAL | 5 refills | Status: DC

## 2017-01-05 MED ORDER — PROPRANOLOL HCL ER 80 MG PO CP24: 80.0000 mg | ORAL_CAPSULE | Freq: Every day | ORAL | 5 refills | Status: DC

## 2017-01-05 MED ORDER — TRAMADOL HCL 50 MG PO TABS: 50.0000 mg | ORAL_TABLET | Freq: Four times a day (QID) | ORAL | 2 refills | Status: DC | PRN
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
## 2017-01-05 NOTE — Patient Instructions (Signed)
1.  We will decrease propranolol ER to 80mg  daily and see if dizziness improves. 2.  Refilled tramadol and sumatriptan 3.  Follow up in 6 months.

## 2017-01-09 ENCOUNTER — Other Ambulatory Visit: Payer: Self-pay | Admitting: Family Medicine

## 2017-01-09 DIAGNOSIS — Z1231 Encounter for screening mammogram for malignant neoplasm of breast: Secondary | ICD-10-CM

## 2017-01-10 ENCOUNTER — Encounter: Payer: Self-pay | Admitting: Gynecology

## 2017-01-10 ENCOUNTER — Ambulatory Visit (INDEPENDENT_AMBULATORY_CARE_PROVIDER_SITE_OTHER): Payer: 59 | Admitting: Gynecology

## 2017-01-10 VITALS — BP 124/74

## 2017-01-10 DIAGNOSIS — R1032 Left lower quadrant pain: Secondary | ICD-10-CM

## 2017-01-10 DIAGNOSIS — N951 Menopausal and female climacteric states: Secondary | ICD-10-CM

## 2017-01-10 NOTE — Patient Instructions (Signed)
Follow-up for the ultrasound as scheduled. 

## 2017-01-10 NOTE — Progress Notes (Signed)
    [name] 10-19-1965 [id number]        51 y.o.  [id number] presents with 2 issues:  1. Left lower quadrant sharp stabbing to cramping pain started about a week ago. Exacerbated with movement. No nausea vomiting diarrhea constipation. No fever or chills. No urinary symptoms such as frequency dysuria or urgency low back pain. History of chronic right-sided pain in the past and ultimately was diagnosed with an internal type hernia knows that actually the pain is better. Status post LAVH, RSO in the past. 2. Menopausal symptoms with some hot flushes and sweats and difficulty losing weight. Had run in the 150s previously and now is in the 160s. When to weight loss clinic where a battery of hormone studies were done and she was told they were out of range and was to repeat them but never followed up for that. Patient notes her diet isn't the best feels that she shouldn't way as much as she does. No hair or skin changes. No diarrhea or constipation.  Past medical history,surgical history, problem list, medications, allergies, family history and social history were all reviewed and documented in the EPIC chart.  Directed ROS with pertinent positives and negatives documented in the history of present illness/assessment and plan.  Exam: [name] assistant Vitals:   01/10/17 1217  BP: 124/74   General appearance:  Normal Spine straight without CVA tenderness Abdomen soft nontender without masses guarding rebound. Pelvic external BUS vagina normal. Bimanual without masses or tenderness. Rectal exam is normal.   Assessment/Plan:  51 y.o. [id number] with:  1. Left lower quadrant discomfort of one week duration. Exam is negative. Check baseline urinalysis. Status post LAVH RSO in the past. We reviewed various possibilities to include GI versus GYN with her remaining left fallopian tube and ovary. Recommended baseline ultrasound to rule out left ovarian pathology. Assuming normal then plan expectant  management. If pain would continue then recommend GI evaluation. 2. Menopausal symptoms/difficulty losing weight. We reviewed the whole perimenopausal time frame. I discussed changes in metabolism and more difficulty with weight control was removed through the 50s. Need to concentrate on a regular exercise and diet program. We reviewed options as far as menopausal symptoms to include expectant management, OTC products and HRT. Risks/benefits were reviewed to include thrombosis such as stroke heart attack DVT and the breast cancer issue. Benefits as far as symptom relief as well as cardiovascular/bone health also reviewed. At this point her symptoms do not seem overly bothersome to her. Will check baseline FSH TSH today. She'll follow up if her symptoms worsen and she wants to rediscuss HRT.  Greater than 50% of my 25 minute office visit was spent in direct face to face counseling and coordination of care with the patient.    Anastasio Auerbach MD, 12:37 PM 01/10/2017

## 2017-01-11 LAB — FOLLICLE STIMULATING HORMONE: FSH: 35.3 m[IU]/mL

## 2017-01-11 LAB — TSH: TSH: 1.39 m[IU]/L

## 2017-01-24 ENCOUNTER — Ambulatory Visit
Admission: RE | Admit: 2017-01-24 | Discharge: 2017-01-24 | Disposition: A | Discharge status: Home / Self Care | Payer: 59 | Source: Ambulatory Visit | Attending: Family Medicine | Admitting: Family Medicine

## 2017-01-24 DIAGNOSIS — Z1231 Encounter for screening mammogram for malignant neoplasm of breast: Secondary | ICD-10-CM

## 2017-01-26 ENCOUNTER — Ambulatory Visit (INDEPENDENT_AMBULATORY_CARE_PROVIDER_SITE_OTHER): Payer: 59

## 2017-01-26 ENCOUNTER — Other Ambulatory Visit: Payer: Self-pay | Admitting: Gynecology

## 2017-01-26 ENCOUNTER — Encounter: Payer: Self-pay | Admitting: Gynecology

## 2017-01-26 ENCOUNTER — Ambulatory Visit (INDEPENDENT_AMBULATORY_CARE_PROVIDER_SITE_OTHER): Payer: 59 | Admitting: Gynecology

## 2017-01-26 VITALS — BP 120/70

## 2017-01-26 DIAGNOSIS — N83202 Unspecified ovarian cyst, left side: Secondary | ICD-10-CM

## 2017-01-26 DIAGNOSIS — N831 Corpus luteum cyst of ovary, unspecified side: Secondary | ICD-10-CM

## 2017-01-26 DIAGNOSIS — R1032 Left lower quadrant pain: Secondary | ICD-10-CM

## 2017-01-26 NOTE — Progress Notes (Signed)
    [name] 03-24-1966 [id number]        51 y.o.  [id number] presents for ultrasound due to left lower quadrant pain.  So had FSH and TSH done last visit due to some menopausal symptoms and her [place] 35 with a normal TSH.  Status post LAVH RSO in the past.  Past medical history,surgical history, problem list, medications, allergies, family history and social history were all reviewed and documented in the EPIC chart.  Directed ROS with pertinent positives and negatives documented in the history of present illness/assessment and plan.  Exam: [name] assistant Vitals:   01/26/17 1525  BP: 120/70   General appearance:  Normal  Ultrasound transvaginal status post hysterectomy and RSO.  Right adnexa is negative.  Left ovary shows 3 thick walled involuting cyst with irregular cystic lumens 20 x 15 x 20 mm, 28 x 22 x 27 mm, 20 x 17 x 17 mm with peripheral flow.  Free fluid noted around the left ovary 23 x 25 x 42 mm.  Assessment/Plan:  51 y.o. [id number] with left lower quadrant discomfort.  Ovary shows cystic physiologic type changes.  Patient notes pain comes and goes but is not getting worse.  Recommended observation for now with follow-up ultrasound in 3 months to relook at the ovary.  Differential to include physiologic versus pathologic changes reviewed.  Assuming pain resolves and cystic changes resolve or stable then will plan expectant follow-up.  Also reviewed the perimenopause with her noting [place] of 35.  She is having some hot flushes and sweats but not overly bothersome at this time.  She does not want to start HRT but will monitor and follow-up if her symptoms worsen and she wants to rediscuss treatment options.  Greater than 50% of my time was spent in direct face to face counseling and coordination of care with the patient.     Anastasio Auerbach MD, 3:58 PM 01/26/2017

## 2017-01-26 NOTE — Patient Instructions (Signed)
Follow up for ultrasound in 3 months

## 2017-01-27 LAB — URINALYSIS W MICROSCOPIC + REFLEX CULTURE
BILIRUBIN URINE: NEGATIVE
Bacteria, UA: NONE SEEN /HPF
GLUCOSE, UA: NEGATIVE
Hgb urine dipstick: NEGATIVE
Hyaline Cast: NONE SEEN /LPF
KETONES UR: NEGATIVE
LEUKOCYTE ESTERASE: NEGATIVE
NITRITES URINE, INITIAL: NEGATIVE
PROTEIN: NEGATIVE
Specific Gravity, Urine: 1.022 (ref 1.001–1.03)
pH: 6 (ref 5.0–8.0)

## 2017-01-27 LAB — NO CULTURE INDICATED

## 2017-02-14 ENCOUNTER — Encounter: Payer: Self-pay | Admitting: Gastroenterology

## 2017-02-15 ENCOUNTER — Other Ambulatory Visit: Payer: Self-pay | Admitting: Family Medicine

## 2017-02-15 ENCOUNTER — Telehealth: Payer: Self-pay | Admitting: Gastroenterology

## 2017-02-15 ENCOUNTER — Other Ambulatory Visit: Payer: Self-pay

## 2017-02-15 MED ORDER — OMEPRAZOLE 20 MG PO CPDR: 20.0000 mg | DELAYED_RELEASE_CAPSULE | ORAL | 1 refills | Status: DC

## 2017-02-15 NOTE — Telephone Encounter (Signed)
Ok to refill #50, 2 ref

## 2017-02-15 NOTE — Telephone Encounter (Signed)
Last office visit 11/30/2016 with Dr. Glori Bickers for right flank pain.  Last refilled 01/05/2017 for #30 with 2 refills.  Ok to refill?

## 2017-02-15 NOTE — Progress Notes (Signed)
Patient had an old prescription and only a few tablets left, sent in new Rx for the omeprazole 20 mg one tablet daily.

## 2017-02-15 NOTE — Telephone Encounter (Signed)
Patient advised of Dr. Lynne Leader recommendations, reviewed antireflux measures. Patient states her Rx for the omeprazole is old and only had a few left. Sent new Rx to her requested pharmacy.

## 2017-02-15 NOTE — Telephone Encounter (Signed)
Tramadol called into [facility], [address] AT [address]

## 2017-02-15 NOTE — Telephone Encounter (Signed)
Patient called states that she has been having increasing upper abdominal pain associated with eating. She has intermittent N/V. She says she has a history of GERD and was prescribed omeprazole for this, takes it prn reflux. She is scheduled as a direct admit colonoscopy in January. Please advise.

## 2017-02-15 NOTE — Telephone Encounter (Signed)
Take omeprazole 20 mg po qam for 1 months every day (not prn). Follow standard antireflux measures. Call back if symptoms do not improve.

## 2017-04-05 ENCOUNTER — Other Ambulatory Visit: Payer: Self-pay | Admitting: Family Medicine

## 2017-04-05 NOTE — Telephone Encounter (Signed)
Px written for call in   I noted she has needed more tramadol lately based on last refill  How is she doing?

## 2017-04-05 NOTE — Telephone Encounter (Signed)
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 Pt said she has seen specialist in the past that said there's not much they can do so she just takes the tramadol prn.  Rx called in as prescribed

## 2017-04-05 NOTE — Telephone Encounter (Signed)
Last office visit 11/30/2016.  Last refilled 02/15/2017 for #50 with 2 refills.  Ok to refill?

## 2017-04-07 ENCOUNTER — Ambulatory Visit: Payer: 59

## 2017-04-12 ENCOUNTER — Encounter: Payer: 59 | Admitting: Gastroenterology

## 2017-04-18 ENCOUNTER — Ambulatory Visit (INDEPENDENT_AMBULATORY_CARE_PROVIDER_SITE_OTHER): Payer: 59

## 2017-04-18 DIAGNOSIS — Z23 Encounter for immunization: Secondary | ICD-10-CM

## 2017-04-26 ENCOUNTER — Other Ambulatory Visit: Payer: Self-pay | Admitting: Neurology

## 2017-04-27 ENCOUNTER — Ambulatory Visit: Payer: 59 | Admitting: Gynecology

## 2017-04-27 ENCOUNTER — Other Ambulatory Visit: Payer: 59

## 2017-05-08 ENCOUNTER — Other Ambulatory Visit: Payer: Self-pay

## 2017-05-08 ENCOUNTER — Encounter: Payer: Self-pay | Admitting: Family Medicine

## 2017-05-08 ENCOUNTER — Encounter: Payer: Self-pay | Admitting: Gynecology

## 2017-05-08 ENCOUNTER — Telehealth: Payer: Self-pay | Admitting: *Deleted

## 2017-05-08 ENCOUNTER — Ambulatory Visit (INDEPENDENT_AMBULATORY_CARE_PROVIDER_SITE_OTHER): Payer: 59 | Admitting: Family Medicine

## 2017-05-08 ENCOUNTER — Ambulatory Visit (INDEPENDENT_AMBULATORY_CARE_PROVIDER_SITE_OTHER): Payer: 59

## 2017-05-08 ENCOUNTER — Ambulatory Visit (INDEPENDENT_AMBULATORY_CARE_PROVIDER_SITE_OTHER): Payer: 59 | Admitting: Gynecology

## 2017-05-08 VITALS — BP 126/78 | HR 86 | Temp 98.9°F | Ht 63.0 in | Wt 164.8 lb

## 2017-05-08 VITALS — BP 116/76

## 2017-05-08 DIAGNOSIS — N83202 Unspecified ovarian cyst, left side: Secondary | ICD-10-CM

## 2017-05-08 DIAGNOSIS — R1032 Left lower quadrant pain: Secondary | ICD-10-CM | POA: Diagnosis not present

## 2017-05-08 DIAGNOSIS — R1031 Right lower quadrant pain: Secondary | ICD-10-CM | POA: Diagnosis not present

## 2017-05-08 DIAGNOSIS — G8929 Other chronic pain: Secondary | ICD-10-CM | POA: Diagnosis not present

## 2017-05-08 DIAGNOSIS — M5412 Radiculopathy, cervical region: Secondary | ICD-10-CM

## 2017-05-08 DIAGNOSIS — D1721 Benign lipomatous neoplasm of skin and subcutaneous tissue of right arm: Secondary | ICD-10-CM | POA: Diagnosis not present

## 2017-05-08 DIAGNOSIS — G43009 Migraine without aura, not intractable, without status migrainosus: Secondary | ICD-10-CM

## 2017-05-08 DIAGNOSIS — R4589 Other symptoms and signs involving emotional state: Secondary | ICD-10-CM

## 2017-05-08 MED ORDER — TRAMADOL HCL 50 MG PO TABS: 50.0000 mg | ORAL_TABLET | Freq: Four times a day (QID) | ORAL | 1 refills | Status: DC | PRN

## 2017-05-08 MED ORDER — FLUCONAZOLE 150 MG PO TABS: 150.0000 mg | ORAL_TABLET | Freq: Once | ORAL | 0 refills | Status: AC

## 2017-05-08 MED ORDER — METHOCARBAMOL 500 MG PO TABS
500.0000 mg | ORAL_TABLET | Freq: Four times a day (QID) | ORAL | 3 refills | Status: DC
Start: 2017-05-08 — End: 2017-10-02

## 2017-05-08 NOTE — Patient Instructions (Signed)
Decrease propranolol ER to 60 mg a day for 2 weeks, then stop.

## 2017-05-08 NOTE — Telephone Encounter (Signed)
Okay for Diflucan 150 mg x 1 dose 

## 2017-05-08 NOTE — Telephone Encounter (Signed)
-----   Message from Anastasio Auerbach, MD sent at 05/08/2017 12:55 PM EST ----- Appointment with general surgeon reference chronic right lower quadrant pain.  Status post appendectomy in the past.  CT scan showed pericecal hernia.

## 2017-05-08 NOTE — Patient Instructions (Addendum)
Office will contact you to help arrange the appointment with the general surgeon.  Office will contact you in reference to the hormone test results.
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
## 2017-05-08 NOTE — Telephone Encounter (Signed)
Referral faxed to [facility] surgery they will call pt to schedule.

## 2017-05-08 NOTE — Telephone Encounter (Signed)
Pt was seen today and forget to mention she has vaginal itching, asked if diflucan tablet can be sent to pharmacy? Please advise

## 2017-05-08 NOTE — Progress Notes (Signed)
    [name] November 04, 1965 [id number]        52 y.o.  [id number] is for follow-up ultrasound.  History of intermittent left lower quadrant discomfort.  Had ultrasound in October which showed some physiologic cystic changes and was recommended to follow-up for repeat ultrasound in 3 months.  Notes her primary complaint is more right-sided where she is having intermittent right lower quadrant pain that comes and goes.  She is status post LAVH RSO in the past.  Appendectomy.  No nausea vomiting diarrhea constipation associated with the pain.  No fever or chills.  No UTI symptoms.   Past medical history,surgical history, problem list, medications, allergies, family history and social history were all reviewed and documented in the EPIC chart.  Directed ROS with pertinent positives and negatives documented in the history of present illness/assessment and plan.  Exam: Vitals:   05/08/17 1231  BP: 116/76   General appearance:  Normal  Ultrasound transvaginal consistent with history of hysterectomy and RSO.  Left ovary with echo-free follicle 24 x 17 x 24 mm.  Thick walled corpus luteal cyst with peripheral color flow Doppler at 29 x 23 mm.  Cul-de-sac negative  Assessment/Plan:  52 y.o. [id number] with normal left ultrasound showing physiologic changes.  Patient's main issue is her right lower quadrant pain that apparently has been going on for some time.  She was evaluated in the emergency room for this and had an CT scan 2017.  They describe a "new unexpected location of the sigmoid colon right of the cecum compatible with a pericecal hernia.  No related obstruction or inflammation" unsure if this is contributing to her recurrent right-sided pain.  Do not feel its gynecologic in origin.  Recommended patient follow-up for review by general surgeon to see if anything could be offered for her recurrent pain.  Patient agrees with this and will help her make that appointment.  Patient also asked about her being  more emotional over the last several months.  Finds herself crying at situations that she normally would not cry at.  She is not depressed or anxious.  Her last [place] was marginal.  We will go ahead and recheck [facility] now.  Discussed possible contribution hormonally.  Not having overt hot flushes or sweats.  She will follow-up for the [facility] results.    Anastasio Auerbach MD, 12:49 PM 05/08/2017

## 2017-05-08 NOTE — Telephone Encounter (Signed)
Pt informed, Rx sent. 

## 2017-05-09 ENCOUNTER — Encounter: Payer: Self-pay | Admitting: Family Medicine

## 2017-05-09 LAB — FOLLICLE STIMULATING HORMONE: FSH: 10.5 m[IU]/mL

## 2017-05-10 NOTE — Telephone Encounter (Signed)
Pt scheduled on 05/23/17 @ 10:15am with Dr.Gossi

## 2017-05-12 ENCOUNTER — Other Ambulatory Visit: Payer: Self-pay | Admitting: Gastroenterology

## 2017-06-30 ENCOUNTER — Encounter: Payer: Self-pay | Admitting: Gynecology

## 2017-06-30 DIAGNOSIS — Z0289 Encounter for other administrative examinations: Secondary | ICD-10-CM

## 2017-07-06 ENCOUNTER — Ambulatory Visit: Payer: 59 | Admitting: Neurology

## 2017-07-10 ENCOUNTER — Ambulatory Visit (INDEPENDENT_AMBULATORY_CARE_PROVIDER_SITE_OTHER): Payer: 59 | Admitting: Neurology

## 2017-07-10 ENCOUNTER — Encounter: Payer: Self-pay | Admitting: Neurology

## 2017-07-10 VITALS — BP 128/74 | HR 90 | Ht 63.0 in | Wt 158.0 lb

## 2017-07-10 DIAGNOSIS — G44209 Tension-type headache, unspecified, not intractable: Secondary | ICD-10-CM

## 2017-07-10 DIAGNOSIS — G43009 Migraine without aura, not intractable, without status migrainosus: Secondary | ICD-10-CM

## 2017-07-10 MED ORDER — TIZANIDINE HCL 2 MG PO TABS: 2.0000 mg | ORAL_TABLET | Freq: Every day | ORAL | 2 refills | Status: DC
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
## 2017-07-10 NOTE — Patient Instructions (Signed)
1.  Start taking tizanidine 2mg , one tablet at bedtime for neck/headache.  If not better in 2 weeks, then increase dose to 2 tablets (4mg ) at bedtime.  Contact me with update and refill if you increase dose. 2.  Continue propranolol ER 80mg  daily 3.  Limit use of Advil to no more than 2 days out of week to prevent rebound headache.  Use sumatriptan if needed for migraine 4.  Use headache diary 5.  Follow up with Dr. Glori Bickers regarding lightheadedness 6.  Follow up in 6 months.

## 2017-07-24 ENCOUNTER — Emergency Department (HOSPITAL_COMMUNITY)
Admission: EM | Admit: 2017-07-24 | Discharge: 2017-07-25 | Disposition: A | Discharge status: Home / Self Care | Payer: 59 | Attending: Emergency Medicine | Admitting: Emergency Medicine

## 2017-07-24 ENCOUNTER — Encounter (HOSPITAL_COMMUNITY): Payer: Self-pay | Admitting: Emergency Medicine

## 2017-07-24 ENCOUNTER — Other Ambulatory Visit: Payer: Self-pay

## 2017-07-24 ENCOUNTER — Emergency Department (HOSPITAL_COMMUNITY): Payer: 59

## 2017-07-24 DIAGNOSIS — Y939 Activity, unspecified: Secondary | ICD-10-CM | POA: Insufficient documentation

## 2017-07-24 DIAGNOSIS — S99912A Unspecified injury of left ankle, initial encounter: Secondary | ICD-10-CM | POA: Diagnosis present

## 2017-07-24 DIAGNOSIS — S93402A Sprain of unspecified ligament of left ankle, initial encounter: Secondary | ICD-10-CM | POA: Insufficient documentation

## 2017-07-24 DIAGNOSIS — Y92007 Garden or yard of unspecified non-institutional (private) residence as the place of occurrence of the external cause: Secondary | ICD-10-CM | POA: Diagnosis not present

## 2017-07-24 DIAGNOSIS — W11XXXA Fall on and from ladder, initial encounter: Secondary | ICD-10-CM | POA: Diagnosis not present

## 2017-07-24 DIAGNOSIS — Y999 Unspecified external cause status: Secondary | ICD-10-CM | POA: Insufficient documentation

## 2017-07-24 DIAGNOSIS — Z79899 Other long term (current) drug therapy: Secondary | ICD-10-CM | POA: Insufficient documentation

## 2017-07-24 NOTE — ED Triage Notes (Signed)
Pt "jumped" from the ladder and landed on landed on the left side, at first there was not pain but as she continued to walk on it the pain has become to the point she can not put any pressure on it.

## 2017-07-24 NOTE — ED Triage Notes (Signed)
Called for triage x1, no answer 

## 2017-07-25 ENCOUNTER — Ambulatory Visit: Payer: 59 | Admitting: Gastroenterology

## 2017-07-25 ENCOUNTER — Telehealth: Payer: Self-pay | Admitting: Gastroenterology

## 2017-07-25 DIAGNOSIS — S93402A Sprain of unspecified ligament of left ankle, initial encounter: Secondary | ICD-10-CM | POA: Diagnosis not present

## 2017-07-25 MED ORDER — MELOXICAM 15 MG PO TABS: 15.0000 mg | ORAL_TABLET | Freq: Every day | ORAL | 0 refills | Status: DC

## 2017-07-25 MED ORDER — OXYCODONE-ACETAMINOPHEN 5-325 MG PO TABS
1.0000 | ORAL_TABLET | Freq: Once | ORAL | Status: AC
  Administered 2017-07-25: 1 via ORAL
  Filled 2017-07-25: qty 1

## 2017-07-25 NOTE — ED Notes (Signed)
One touch pt, see PA's assessment for details.

## 2017-07-25 NOTE — ED Provider Notes (Signed)
[facility] EMERGENCY DEPARTMENT Provider Note   CSN: [id number] Arrival date & time: 07/24/17  2149     History   Chief Complaint Chief Complaint  Patient presents with  . Ankle Injury    HPI [name] is a 52 y.o. female who presents to ED for evaluation of left ankle pain.  She was up on a ladder getting a balloon down from a tree when she lost her balance and fell onto her left ankle.  She states that the pain "did not bother" her when it occurred.  She was able to ambulate although limping.  She then proceeded to go to the gym.  After the gym she began having worsening of her pain.  Denies any prior fracture, dislocations or procedures in the area.  She is not taking medications to help with the pain.  She denies any head injuries, loss of consciousness, numbness in legs, back pain or vision changes.  HPI  Past Medical History:  Diagnosis Date  . 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 GERD (gastroesophageal reflux disease)   . Hiatal hernia   . HPV in female    also some abnormal paps in past  . PPD positive    Hx of  . Renal lithiasis 2013  . Sickle cell trait [facility])     Patient Active Problem List   Diagnosis Date Noted  . Hernia, abdominal 12/14/2015  . Migraine without aura and without status migrainosus, not intractable 05/28/2015  . Family history of brain aneurysm 01/07/2013  . Eczema 12/21/2012  . GERD 08/20/2008  . RENAL CALCULUS, HX OF 08/20/2008    Past Surgical History:  Procedure Laterality Date  . APPENDECTOMY     1990's  . BREAST EXCISIONAL BIOPSY Left 2014  . BREAST SURGERY     removal of L breast mass / benign /age [age]  . CESAREAN SECTION     x3  . CYSTOSCOPY  2008   L RPG, stone extraction ([place])  . ESOPHAGOGASTRODUODENOSCOPY  06/15/2012   Several----normal   . FOOT SURGERY  JULY 2013   RIGHT  . LAPAROSCOPIC VAGINAL HYSTERECTOMY  12/02/2010   menorrhagia, leiomyomata  . LAPAROSCOPY N/A 05/24/2012    Procedure: LAPAROSCOPY OPERATIVE;  Surgeon: Anastasio Auerbach, MD;  Location: [facility];  Service: Gynecology;  Laterality: N/A;  . PELVIC LAPAROSCOPY     x2 for cysts r/o endometriosis  . SALPINGOOPHORECTOMY Right 05/24/2012   Procedure: SALPINGO OOPHORECTOMY;  Surgeon: Anastasio Auerbach, MD;  Location: [facility];  Service: Gynecology;  Laterality: Right;  . TONSILLECTOMY AND ADENOIDECTOMY     age 62  . TUBAL LIGATION     BTL     OB History    Gravida  3   Para  3   Term  2   Preterm  1   AB      Living  3     SAB      TAB      Ectopic      Multiple      Live Births  3            Home Medications    Prior to Admission medications   Medication Sig Start Date End Date Taking? Authorizing Provider  meloxicam (MOBIC) 15 MG tablet Take 1 tablet (15 mg total) by mouth daily. 07/25/17   Adalaya Irion, PA-C  methocarbamol (ROBAXIN) 500 MG tablet Take 1 tablet (500 mg total) by  mouth 4 (four) times daily. 05/08/17   Copland, Frederico Hamman, MD  omeprazole (PRILOSEC) 20 MG capsule Take 1 capsule (20 mg total) every morning by mouth. 02/15/17   Ladene Artist, MD  propranolol ER (INDERAL LA) 80 MG 24 hr capsule Take 1 capsule (80 mg total) by mouth daily. 01/05/17   Pieter Partridge, DO  SUMAtriptan (IMITREX) 100 MG tablet Take 1 tablet earliest onset of migraine.  May repeat x1 in 2 hours if headache persists or recurs. 01/05/17   Pieter Partridge, DO  tiZANidine (ZANAFLEX) 2 MG tablet Take 1 tablet (2 mg total) by mouth at bedtime. 07/10/17   Pieter Partridge, DO  traMADol (ULTRAM) 50 MG tablet Take 1 tablet (50 mg total) by mouth every 6 (six) hours as needed. 05/08/17   Copland, Frederico Hamman, MD  albuterol (PROAIR HFA) 108 (90 BASE) MCG/ACT inhaler Inhale 2 puffs into the lungs every 4 (four) hours as needed.    06/22/11  [provider]    Family History Family History  Problem Relation Age of Onset  . Anesthesia problems Mother   . Hypertension Mother   . Aneurysm Father   . Breast  cancer Maternal Aunt 61  . Cancer Paternal Aunt        uterine CA  . Lupus Cousin   . Prostate cancer Brother   . Colon cancer Neg Hx   . Esophageal cancer Neg Hx   . Stomach cancer Neg Hx   . Rectal cancer Neg Hx     Social History Social History   Tobacco Use  . Smoking status: Never Smoker  . Smokeless tobacco: Never Used  Substance Use Topics  . Alcohol use: No    Alcohol/week: 0.0 oz  . Drug use: No     Allergies   Banana and Morphine   Review of Systems Review of Systems  Constitutional: Negative for chills and fever.  Eyes: Negative for visual disturbance.  Gastrointestinal: Negative for nausea and vomiting.  Musculoskeletal: Positive for arthralgias, gait problem and joint swelling. Negative for back pain and myalgias.  Skin: Negative for wound.  Neurological: Negative for syncope, weakness and headaches.     Physical Exam Updated Vital Signs BP 107/84 (BP Location: Right Arm)   Pulse 84   Temp 98.1 F (36.7 C) (Oral)   Resp 18   Ht 5\' 3"  (1.6 m)   Wt 67.1 kg (148 lb)   LMP 11/24/2010   SpO2 100%   BMI 26.22 kg/m   Physical Exam  Constitutional: She appears well-developed and well-nourished. No distress.  Nontoxic appearing and in no acute distress.  HENT:  Head: Normocephalic and atraumatic.  Eyes: Conjunctivae and EOM are normal. No scleral icterus.  Neck: Normal range of motion.  Cardiovascular: Normal rate, regular rhythm and normal heart sounds.  Pulmonary/Chest: Effort normal and breath sounds normal. No respiratory distress.  Musculoskeletal: Normal range of motion. She exhibits tenderness. She exhibits no edema or deformity.  Tenderness to palpation of the lateral malleolus of the left ankle.  Only mild edema noted.  No warmth or erythema of the joint noted.  Limited active range of motion secondary to pain. No midline spinal tenderness present in lumbar, thoracic or cervical spine. No step-off palpated. No visible bruising, edema or  temperature change noted. No objective signs of numbness present. No saddle anesthesia. 2+ DP pulses bilaterally. Sensation intact to light touch. Strength 5/5 in bilateral lower extremities.  Neurological: She is alert.  Skin: No rash noted.  She is not diaphoretic.  No calf tenderness bilaterally.  Psychiatric: She has a normal mood and affect.  Nursing note and vitals reviewed.    ED Treatments / Results  Labs (all labs ordered are listed, but only abnormal results are displayed) Labs Reviewed - No data to display  EKG None  Radiology Dg Ankle Complete Left  Result Date: 07/24/2017 CLINICAL DATA:  Fall with ankle pain and edema EXAM: LEFT ANKLE COMPLETE - 3+ VIEW COMPARISON:  None. FINDINGS: No fracture or malalignment.  The ankle mortise is symmetric. IMPRESSION: No definite acute osseous abnormality Electronically Signed   By: Donavan Foil M.D.   On: 07/24/2017 23:54    Procedures Procedures (including critical care time)  Medications Ordered in ED Medications  oxyCODONE-acetaminophen (PERCOCET/ROXICET) 5-325 MG per tablet 1 tablet (has no administration in time range)     Initial Impression / Assessment and Plan / ED Course  I have reviewed the triage vital signs and the nursing notes.  Pertinent labs & imaging results that were available during my care of the patient were reviewed by me and considered in my medical decision making (see chart for details).     Patient presents to ED for evaluation of left ankle pain after twisting it prior to arrival.  She has been ambulatory although limping.  Denies any head injury, loss of consciousness, vision changes.  Tenderness to palpation of the lateral malleolus of the left ankle with no erythema or warmth of joint noted.  No calf tenderness noted.  X-ray returned as negative for acute abnormality.  Suspect that her symptoms could be due to a sprain.  Low suspicion for infectious or vascular cause of symptoms.  Will give  instructions on rice therapy, follow-up with orthopedist if symptoms persist.  Advised to return for any severe worsening symptoms.  Portions of this note were generated with [employer]. Dictation errors may occur despite best attempts at proofreading.   Final Clinical Impressions(s) / ED Diagnoses   Final diagnoses:  Sprain of left ankle, unspecified ligament, initial encounter    ED Discharge Orders        Ordered    meloxicam (MOBIC) 15 MG tablet  Daily     07/25/17 0021       Delia Heady, PA-C 07/25/17 0024    Rolland Porter, MD 07/25/17 0045

## 2017-07-25 NOTE — Telephone Encounter (Signed)
NO CHARGE

## 2017-08-20 ENCOUNTER — Other Ambulatory Visit: Payer: Self-pay | Admitting: Family Medicine

## 2017-08-21 NOTE — Telephone Encounter (Signed)
Last office visit 05/08/2017.  Last refilled 05/08/2017 for #180 with 1 refill.  Ok to refill?

## 2017-09-01 ENCOUNTER — Ambulatory Visit: Payer: 59 | Admitting: Gastroenterology

## 2017-09-04 ENCOUNTER — Telehealth: Payer: Self-pay | Admitting: *Deleted

## 2017-09-04 MED ORDER — FLUCONAZOLE 150 MG PO TABS: 150.0000 mg | ORAL_TABLET | Freq: Once | ORAL | 0 refills | Status: AC

## 2017-09-04 NOTE — Telephone Encounter (Signed)
Okay for Diflucan 150 mg x 1 dose 

## 2017-09-04 NOTE — Telephone Encounter (Signed)
Patient has annual exam scheduled on 10/23/17, c/o vaginal itching, had recent dental procedure and finish antibiotic, now has vaginal itching. Asked if diflucan can be prescribed? Please advise

## 2017-09-04 NOTE — Telephone Encounter (Signed)
Patient aware, Rx sent.  
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
## 2017-10-02 ENCOUNTER — Encounter: Payer: Self-pay | Admitting: Family Medicine

## 2017-10-02 ENCOUNTER — Ambulatory Visit (INDEPENDENT_AMBULATORY_CARE_PROVIDER_SITE_OTHER): Payer: 59 | Admitting: Family Medicine

## 2017-10-02 VITALS — BP 120/72 | HR 78 | Temp 98.1°F | Ht 63.0 in | Wt 160.8 lb

## 2017-10-02 DIAGNOSIS — M5412 Radiculopathy, cervical region: Secondary | ICD-10-CM

## 2017-10-02 MED ORDER — METHOCARBAMOL 500 MG PO TABS: 500.0000 mg | ORAL_TABLET | Freq: Four times a day (QID) | ORAL | 3 refills | Status: DC

## 2017-10-02 MED ORDER — TRAMADOL HCL 50 MG PO TABS: ORAL_TABLET | ORAL | 1 refills | Status: DC

## 2017-10-02 NOTE — Patient Instructions (Signed)
REFERRALS TO SPECIALISTS, SPECIAL TESTS (MRI, CT, ULTRASOUNDS)  [facility] or  Anastasiya will help you. ASK CHECK-IN FOR HELP.  Specialist appointment times vary a great deal, based on their schedule / openings. -- Some specialists have very long wait times. (Example. Dermatology)    

## 2017-10-23 ENCOUNTER — Encounter: Payer: 59 | Admitting: Gynecology

## 2017-11-21 ENCOUNTER — Encounter: Payer: Self-pay | Admitting: Gastroenterology

## 2017-11-21 ENCOUNTER — Ambulatory Visit (INDEPENDENT_AMBULATORY_CARE_PROVIDER_SITE_OTHER): Payer: 59 | Admitting: Gastroenterology

## 2017-11-21 VITALS — BP 126/74 | HR 80 | Ht 63.0 in | Wt 156.4 lb

## 2017-11-21 DIAGNOSIS — R1031 Right lower quadrant pain: Secondary | ICD-10-CM | POA: Diagnosis not present

## 2017-11-21 DIAGNOSIS — R933 Abnormal findings on diagnostic imaging of other parts of digestive tract: Secondary | ICD-10-CM | POA: Diagnosis not present

## 2017-11-21 DIAGNOSIS — Z1212 Encounter for screening for malignant neoplasm of rectum: Principal | ICD-10-CM

## 2017-11-21 DIAGNOSIS — Z1211 Encounter for screening for malignant neoplasm of colon: Secondary | ICD-10-CM

## 2017-11-21 DIAGNOSIS — G8929 Other chronic pain: Secondary | ICD-10-CM | POA: Diagnosis not present

## 2017-11-21 DIAGNOSIS — K5901 Slow transit constipation: Secondary | ICD-10-CM | POA: Diagnosis not present

## 2017-11-21 MED ORDER — LINACLOTIDE 145 MCG PO CAPS: 145.0000 ug | ORAL_CAPSULE | Freq: Every day | ORAL | 11 refills | Status: DC

## 2017-11-21 MED ORDER — NA SULFATE-K SULFATE-MG SULF 17.5-3.13-1.6 GM/177ML PO SOLN: 1.0000 | Freq: Once | ORAL | 0 refills | Status: AC

## 2017-11-21 NOTE — Patient Instructions (Signed)
We have sent the following medications to your pharmacy for you to pick up at your convenience: Linzess 145 mcg daily.   Please take Linzess daily along with dulcolax 2-4 capsules day to bring your bowels more regular. Your goal is to at least have a bowel movement every 2-3 days.   You have been scheduled for a colonoscopy. Please follow written instructions given to you at your visit today.  Please pick up your prep supplies at the pharmacy within the next 1-3 days. If you use inhalers (even only as needed), please bring them with you on the day of your procedure. Your physician has requested that you go to www.startemmi.com and enter the access code given to you at your visit today. This web site gives a general overview about your procedure. However, you should still follow specific instructions given to you by our office regarding your preparation for the procedure.  Normal BMI (Body Mass Index- based on height and weight) is between 19 and 25. Your BMI today is Body mass index is 27.7 kg/m. Marland Kitchen Please consider follow up  regarding your BMI with your Primary Care Provider.  Thank you for choosing me and [facility].  Pricilla Riffle. Dagoberto Ligas., MD., [facility] Regal

## 2017-11-21 NOTE — Progress Notes (Signed)
History of Present Illness: This is a 52 year old female referred by Michael Boston, MD for the evaluation of RLQ pain, constipation. Appendectomy 20 years ago and she relates intermittent difficulties with right lower quadrant pain since then.  She has had a hysterectomy for endometriosis and pelvic laparoscopy.  She relates worsening problems with constipation and often goes 7 to 14 days without a bowel movement.  She frequently takes Dulcolax, magnesium citrate and enemas to help relieve constipation.  Dr. Johney Maine recommended taking MiraLAX on a daily basis which she says was not effective so she discontinued.  She was evaluated by Dr. gross for abnormal CT scan and he was questioning the diagnosis of a pericecal hernia.  Denies weight loss, diarrhea, change in stool caliber, melena, hematochezia, nausea, vomiting, dysphagia, chest pain.   Abd/pelvic CT 09/2015  1. New unexpected location of the sigmoid colon right of the cecum, compatible with a pericecal hernia. No related obstruction or inflammation. 2. Small pelvic fluid which may be reactive in the setting of a left corpus luteum. 3. Bilateral nonobstructive nephrolithiasis and other incidental findings noted above.   Allergies  Allergen Reactions  . Banana Itching    Throat itches  . Morphine Itching   Outpatient Medications Prior to Visit  Medication Sig Dispense Refill  . omeprazole (PRILOSEC) 20 MG capsule Take 1 capsule (20 mg total) every morning by mouth. 30 capsule 1  . propranolol ER (INDERAL LA) 80 MG 24 hr capsule Take 1 capsule (80 mg total) by mouth daily. 30 capsule 5  . SUMAtriptan (IMITREX) 100 MG tablet Take 1 tablet earliest onset of migraine.  May repeat x1 in 2 hours if headache persists or recurs. 10 tablet 5  . tiZANidine (ZANAFLEX) 2 MG tablet Take 1 tablet (2 mg total) by mouth at bedtime. 30 tablet 2  . traMADol (ULTRAM) 50 MG tablet TAKE 1 TABLET(50 MG) BY MOUTH EVERY 6 HOURS AS NEEDED 120 tablet 1  .  methocarbamol (ROBAXIN) 500 MG tablet Take 1 tablet (500 mg total) by mouth 4 (four) times daily. 90 tablet 3   No facility-administered medications prior to visit.    Past Medical History:  Diagnosis Date  . 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 GERD (gastroesophageal reflux disease)   . Hiatal hernia   . HPV in female    also some abnormal paps in past  . PPD positive    Hx of  . Renal lithiasis 2013  . Sickle cell trait [facility])    Past Surgical History:  Procedure Laterality Date  . APPENDECTOMY     1990's  . BREAST EXCISIONAL BIOPSY Left 2014  . BREAST SURGERY     removal of L breast mass / benign /age [age]  . CESAREAN SECTION     x3  . CYSTOSCOPY  2008   L RPG, stone extraction ([place])  . ESOPHAGOGASTRODUODENOSCOPY  06/15/2012   Several----normal   . FOOT SURGERY  JULY 2013   RIGHT  . LAPAROSCOPIC VAGINAL HYSTERECTOMY  12/02/2010   menorrhagia, leiomyomata  . LAPAROSCOPY N/A 05/24/2012   Procedure: LAPAROSCOPY OPERATIVE;  Surgeon: Anastasio Auerbach, MD;  Location: [facility];  Service: Gynecology;  Laterality: N/A;  . PELVIC LAPAROSCOPY     x2 for cysts r/o endometriosis  . SALPINGOOPHORECTOMY Right 05/24/2012   Procedure: SALPINGO OOPHORECTOMY;  Surgeon: Anastasio Auerbach, MD;  Location: [facility];  Service: Gynecology;  Laterality: Right;  . TONSILLECTOMY AND ADENOIDECTOMY  age 43  . TUBAL LIGATION     BTL   Social History   Socioeconomic History  . Marital status: Married    Spouse name: [name]  . Number of children: 3  . Years of education: College  . Highest education level: Not on file  Occupational History  . Occupation: Therapist, music: [employer]  . Financial resource strain: Not on file  . Food insecurity:    Worry: Not on file    Inability: Not on file  . Transportation needs:    Medical: Not on file    Non-medical: Not on file  Tobacco Use  . Smoking status: Never Smoker  . Smokeless tobacco: Never  Used  Substance and Sexual Activity  . Alcohol use: No    Alcohol/week: 0.0 standard drinks  . Drug use: No  . Sexual activity: Yes    Partners: Male    Birth control/protection: Surgical    Comment: 1st intercourse 52 yo-Fewer than 5 partners  Lifestyle  . Physical activity:    Days per week: Not on file    Minutes per session: Not on file  . Stress: Not on file  Relationships  . Social connections:    Talks on phone: Not on file    Gets together: Not on file    Attends religious service: Not on file    Active member of club or organization: Not on file    Attends meetings of clubs or organizations: Not on file    Relationship status: Not on file  Other Topics Concern  . Not on file  Social History Narrative   Pt lives at home with her spouse.   Caffeine Use: 1-2 cups daily; sodas occasionally   Family History  Problem Relation Age of Onset  . Anesthesia problems Mother   . Hypertension Mother   . Aneurysm Father   . Breast cancer Maternal Aunt 57  . Cancer Paternal Aunt        uterine CA  . Lupus Cousin   . Prostate cancer Brother   . Colon cancer Neg Hx   . Esophageal cancer Neg Hx   . Stomach cancer Neg Hx   . Rectal cancer Neg Hx       Review of Systems: Pertinent positive and negative review of systems were noted in the above HPI section. All other review of systems were otherwise negative.    Physical Exam: General: Well developed, well nourished, no acute distress Head: Normocephalic and atraumatic Eyes:  sclerae anicteric, EOMI Ears: Normal auditory acuity Mouth: No deformity or lesions Neck: Supple, no masses or thyromegaly Lungs: Clear throughout to auscultation Heart: Regular rate and rhythm; no murmurs, rubs or bruits Abdomen: Soft, non tender and non distended. No masses, hepatosplenomegaly or hernias noted. Normal Bowel sounds Rectal: Deferred to colonoscopy Musculoskeletal: Symmetrical with no gross deformities  Skin: No lesions on visible  extremities Pulses:  Normal pulses noted Extremities: No clubbing, cyanosis, edema or deformities noted Neurological: Alert oriented x 4, grossly nonfocal Cervical Nodes:  No significant cervical adenopathy Inguinal Nodes: No significant inguinal adenopathy Psychological:  Alert and cooperative. Normal mood and affect  Assessment and Recommendations:  1.  Severe constipation. Begin Linzess 145 mcg daily.  If not effective increase to 290 mcg daily.  Dulcolax 2 to 4 tablets every 2 to 3 days if no bowel movement.  We discussed the need for a long-term bowel regimen to target a complete bowel movement at  least every 2 to 3 days.  Schedule colonoscopy. The risks, benefits, and alternatives to colonoscopy with possible biopsy, possible polypectomy and possible injection of hemorrhoids were discussed with the patient and they consent to proceed.   2.  She describes reflux symptoms and early satiety.  Symptoms not helped with omeprazole.  Symptoms could be related to severe constipation.  Assess response improved treatment of her constipation.  3. Abnormal CT of the abdomen suggesting a pericecal hernia.  Follow-up with Dr. Johney Maine following colonoscopy.  4. Chronic intermittent RLQ pain could be related to adhesions, constipation or pericecal hernia.  Follow-up with Dr. Johney Maine.  cc: Michael Boston, MD [address] [place], [place] [address]

## 2017-12-13 ENCOUNTER — Other Ambulatory Visit: Payer: Self-pay | Admitting: Family Medicine

## 2017-12-14 ENCOUNTER — Ambulatory Visit (INDEPENDENT_AMBULATORY_CARE_PROVIDER_SITE_OTHER): Payer: 59 | Admitting: Gynecology

## 2017-12-14 ENCOUNTER — Encounter: Payer: Self-pay | Admitting: Gynecology

## 2017-12-14 VITALS — BP 120/80 | Ht 63.0 in | Wt 158.0 lb

## 2017-12-14 DIAGNOSIS — N83202 Unspecified ovarian cyst, left side: Secondary | ICD-10-CM | POA: Diagnosis not present

## 2017-12-14 DIAGNOSIS — Z01411 Encounter for gynecological examination (general) (routine) with abnormal findings: Secondary | ICD-10-CM | POA: Diagnosis not present

## 2017-12-14 DIAGNOSIS — N898 Other specified noninflammatory disorders of vagina: Secondary | ICD-10-CM

## 2017-12-14 DIAGNOSIS — N941 Unspecified dyspareunia: Secondary | ICD-10-CM

## 2017-12-14 DIAGNOSIS — N952 Postmenopausal atrophic vaginitis: Secondary | ICD-10-CM | POA: Diagnosis not present

## 2017-12-14 DIAGNOSIS — Q838 Other congenital malformations of breast: Secondary | ICD-10-CM

## 2017-12-14 LAB — WET PREP FOR TRICH, YEAST, CLUE

## 2017-12-14 MED ORDER — FLUCONAZOLE 150 MG PO TABS: 150.0000 mg | ORAL_TABLET | Freq: Every day | ORAL | 0 refills | Status: DC

## 2017-12-14 NOTE — Telephone Encounter (Signed)
We have discussed at length - I think ESI or other intervention is a realistic, minimally invasive option that might offer definitive relief. Per memory, I do not recall advocating even consultation with neurosurgery or orthopedic spine surgery.   Ultimately, the patient as a free citizen can make her choices regarding her own healthcare. Neuropathic pain medications, nsaids, tylenol, and tramadol would be reasonable long-term for conservative management.

## 2017-12-14 NOTE — Patient Instructions (Signed)
Follow-up for the ultrasound as scheduled. 

## 2017-12-14 NOTE — Telephone Encounter (Signed)
Last office visit 10/02/2017 with Dr. Lorelei Pont for neck pain.  No future appointments. Last refilled 10/02/2017 for #120 with 1 refill.  Ok to refill?

## 2017-12-14 NOTE — Progress Notes (Signed)
[name] 08/17/1965 [id number]        52 y.o.  [id number] for annual gynecologic exam.  Also complaining:  1. Continued right lower quadrant pain coming and going.  Feels a bulge in her right lower quadrant around her appendectomy scar.  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  Had ultrasound 05/2017 which showed left ovary with echo-free follicle 24 x 17 x 24 mm.  Thick walled corpus luteal cyst with peripheral flow at 29 x 23 mm.  Was to repeat the ultrasound in several months but has not followed up for this yet.  Had CT scan also in 2017 for evaluation with a saw nonobstructing renal lithiasis and unexpected location of colon right of cecum.  Continues to have the pain on and off. 2. Vaginal discharge with itching.  No urinary symptoms or vaginal odor. 3. Vaginal dryness and irritation with intercourse.  No vaginal dryness in between.  Past medical history,surgical history, problem list, medications, allergies, family history and social history were all reviewed and documented as reviewed in the EPIC chart.  ROS:  Performed with pertinent positives and negatives included in the history, assessment and plan.   Additional significant findings : None   Exam: Copywriter, advertising Vitals:   12/14/17 1600  BP: 120/80  Weight: 158 lb (71.7 kg)  Height: 5\' 3"  (1.6 m)   Body mass index is 27.99 kg/m.  General appearance:  Normal affect, orientation and appearance. Skin: Grossly normal HEENT: Without gross lesions.  No cervical or supraclavicular adenopathy. Thyroid normal.  Lungs:  Clear without wheezing, rales or rhonchi Cardiac: RR, without RMG Abdominal:  Soft, without masses, guarding, rebound, organomegaly.  Tender around her appendectomy scar with slight bulging below suggesting fascial weakness/early hernia Breasts:  Examined lying and sitting.  Left without masses, retractions, discharge or axillary adenopathy.  Right without masses, retractions, discharge.  Right axillary  fullness consistent with history of ectopic breast tissue Pelvic:  Ext, BUS, Vagina: Thick white discharge  Adnexa: Without masses or tenderness    Anus and perineum: Normal   Rectovaginal: Normal sphincter tone without palpated masses or tenderness.    Assessment/Plan:  52 y.o. [id number] female for annual gynecologic exam.   1. Chronic right lower quadrant pain.  Some tenderness around her appendectomy scar with some bulging with straining to consistent with possible early hernia.  She has a colonoscopy scheduled at the end of September and will follow-up for this.  She has physiologic appearing cysts on her left ovary noticed 05/2017 ultrasound.  She was to repeat this in several months.  We will go ahead and schedule ultrasound now.  Will await ultrasound results and colonoscopy results.  Will ultimately refer to general surgeon to evaluate for hernia evaluation and her chronic right lower quadrant pain. 2. Vaginal discharge with irritation.  Exam and wet prep consistent with yeast.  Will treat with Diflucan 150 mg daily x2 doses.  Follow-up if symptoms persist, worsen or recur. 3. Vaginal dryness with intercourse.  Discussed may be contributed by underlying yeast.  Also issues of vaginal atrophy discussed although no if again changes on exam.  Recommended patient try over-the-counter lubrication and see if this does not help.  If issues persist she will represent to discuss possible vaginal estrogen supplementation.  She is having no other significant symptoms such as hot flushes and sweats.  Has had an elevated FSH in the past. 4. 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  She saw a general surgeon who discussed excision versus observation and they both agree to follow for now.  Mammogram coming due in October and she will follow-up for this.  Breast exam normal today. 5. Pap smear 2018.  No Pap smear done today.  History of abnormal Pap smears a number of years ago  with normal Pap since. 6. Health maintenance.  No routine lab work done as patient does this elsewhere.  Follow-up for ultrasound.  Follow-up for annual exam in 1 year.   Anastasio Auerbach MD, 4:48 PM 12/14/2017

## 2017-12-14 NOTE — Telephone Encounter (Signed)
Pt said that Dr. Lorelei Pont said that this is just something she will have to live with and that the only real option was injections in her neck or some type of surgery. Pt said that she was advised that even those options were not a guarantee fix and that it could make her sxs worse if it doesn't work so she declined any invasive treatments. Pt said that she has been doing the at home exercises he recommended and she is also going to the gym. She has stopped certain exercises at the gym that Dr. Lorelei Pont told her could make her sxs worse but she is still working out too. Pt said she is just afraid to do the surgery so she will keep using the med and just do what she can at home to help with her sxs since she will just have to live with this

## 2017-12-14 NOTE — Telephone Encounter (Signed)
I want her to minimize the tramadol as much as possible (ie: only if really needed) due to the long term possibility of dependence and other side effects Thanks for the update

## 2017-12-14 NOTE — Telephone Encounter (Signed)
I refilled it once  I rev Dr Copland's last note -he ref her to physical med and rehab Please check in with her and see how she is doing ?  I will CC to him as well  Thanks

## 2018-01-01 ENCOUNTER — Ambulatory Visit (AMBULATORY_SURGERY_CENTER): Payer: 59 | Admitting: Gastroenterology

## 2018-01-01 ENCOUNTER — Encounter: Payer: Self-pay | Admitting: Gastroenterology

## 2018-01-01 VITALS — BP 116/52 | HR 71 | Temp 97.5°F | Resp 16 | Ht 63.0 in | Wt 158.0 lb

## 2018-01-01 DIAGNOSIS — D120 Benign neoplasm of cecum: Secondary | ICD-10-CM | POA: Diagnosis not present

## 2018-01-01 DIAGNOSIS — D126 Benign neoplasm of colon, unspecified: Secondary | ICD-10-CM

## 2018-01-01 DIAGNOSIS — K635 Polyp of colon: Secondary | ICD-10-CM | POA: Diagnosis not present

## 2018-01-01 DIAGNOSIS — Z1211 Encounter for screening for malignant neoplasm of colon: Secondary | ICD-10-CM | POA: Diagnosis not present

## 2018-01-01 DIAGNOSIS — D125 Benign neoplasm of sigmoid colon: Secondary | ICD-10-CM

## 2018-01-01 MED ORDER — SODIUM CHLORIDE 0.9 % IV SOLN: 500.0000 mL | Freq: Once | INTRAVENOUS | Status: DC

## 2018-01-01 NOTE — Op Note (Signed)
[place] Patient Name: [name] Procedure Date: 01/01/2018 1:51 PM MRN: [id number] Endoscopist: Ladene Artist , MD Age: 52 Referring MD:  Date of Birth: Feb 06, 1966 Gender: Female Account #: [id number] Procedure:                Colonoscopy Indications:              Screening for colorectal malignant neoplasm Medicines:                Monitored Anesthesia Care Procedure:                Pre-Anesthesia Assessment:                           - Prior to the procedure, a History and Physical                            was performed, and patient medications and                            allergies were reviewed. The patient's tolerance of                            previous anesthesia was also reviewed. The risks                            and benefits of the procedure and the sedation                            options and risks were discussed with the patient.                            All questions were answered, and informed consent                            was obtained. Prior Anticoagulants: The patient has                            taken no previous anticoagulant or antiplatelet                            agents. ASA Grade Assessment: II - A patient with                            mild systemic disease. After reviewing the risks                            and benefits, the patient was deemed in                            satisfactory condition to undergo the procedure.                           After obtaining informed consent, the colonoscope  was passed under direct vision. Throughout the                            procedure, the patient's blood pressure, pulse, and                            oxygen saturations were monitored continuously. The                            Model PCF-[id number]) scope was introduced                            through the anus and advanced to the the terminal                            ileum, with  identification of the appendiceal                            orifice and IC valve. The ileocecal valve,                            appendiceal orifice, and rectum were photographed.                            The quality of the bowel preparation was excellent.                            The colonoscopy was performed without difficulty.                            The patient tolerated the procedure well. Scope In: 2:00:12 PM Scope Out: 2:14:38 PM Scope Withdrawal Time: 0 hours 10 minutes 18 seconds  Total Procedure Duration: 0 hours 14 minutes 26 seconds  Findings:                 The perianal and digital rectal examinations were                            normal.                           The terminal ileum appeared normal.                           Two sessile polyps were found in the sigmoid colon                            and cecum. The polyps were 8 mm in size. These                            polyps were removed with a cold snare. Resection                            and retrieval were complete.  The exam was otherwise without abnormality on                            direct and retroflexion views. Complications:            No immediate complications. Estimated blood loss:                            None. Estimated Blood Loss:     Estimated blood loss: none. Impression:               - The examined portion of the ileum was normal.                           - Two 8 mm polyps in the sigmoid colon and in the                            cecum, removed with a cold snare. Resected and                            retrieved.                           - The examination was otherwise normal on direct                            and retroflexion views. Recommendation:           - Repeat colonoscopy in 5 years for surveillance if                            polyp(s) are adenomatous.                           - Patient has a contact number available for                             emergencies. The signs and symptoms of potential                            delayed complications were discussed with the                            patient. Return to normal activities tomorrow.                            Written discharge instructions were provided to the                            patient.                           - Resume previous diet.                           - Continue present medications.                           -  Await pathology results. Ladene Artist, MD 01/01/2018 2:17:58 PM This report has been signed electronically.

## 2018-01-01 NOTE — Patient Instructions (Signed)
*   Handout on Polyps given.  YOU HAD AN ENDOSCOPIC PROCEDURE TODAY AT [facility]:   Refer to the procedure report that was given to you for any specific questions about what was found during the examination.  If the procedure report does not answer your questions, please call your gastroenterologist to clarify.  If you requested that your care partner not be given the details of your procedure findings, then the procedure report has been included in a sealed envelope for you to review at your convenience later.  YOU SHOULD EXPECT: Some feelings of bloating in the abdomen. Passage of more gas than usual.  Walking can help get rid of the air that was put into your GI tract during the procedure and reduce the bloating. If you had a lower endoscopy (such as a colonoscopy or flexible sigmoidoscopy) you may notice spotting of blood in your stool or on the toilet paper. If you underwent a bowel prep for your procedure, you may not have a normal bowel movement for a few days.  Please Note:  You might notice some irritation and congestion in your nose or some drainage.  This is from the oxygen used during your procedure.  There is no need for concern and it should clear up in a day or so.  SYMPTOMS TO REPORT IMMEDIATELY:   Following lower endoscopy (colonoscopy or flexible sigmoidoscopy):  Excessive amounts of blood in the stool  Significant tenderness or worsening of abdominal pains  Swelling of the abdomen that is new, acute  Fever of 100F or higher   For urgent or emergent issues, a gastroenterologist can be reached at any hour by calling [phone].   DIET:  We do recommend a small meal at first, but then you may proceed to your regular diet.  Drink plenty of fluids but you should avoid alcoholic beverages for 24 hours.  ACTIVITY:  You should plan to take it easy for the rest of today and you should NOT DRIVE or use heavy machinery until tomorrow (because of the sedation  medicines used during the test).    FOLLOW UP: Our staff will call the number listed on your records the next business day following your procedure to check on you and address any questions or concerns that you may have regarding the information given to you following your procedure. If we do not reach you, we will leave a message.  However, if you are feeling well and you are not experiencing any problems, there is no need to return our call.  We will assume that you have returned to your regular daily activities without incident.  If any biopsies were taken you will be contacted by phone or by letter within the next 1-3 weeks.  Please call us at [phone] if you have not heard about the biopsies in 3 weeks.    SIGNATURES/CONFIDENTIALITY: You and/or your care partner have signed paperwork which will be entered into your electronic medical record.  These signatures attest to the fact that that the information above on your After Visit Summary has been reviewed and is understood.  Full responsibility of the confidentiality of this discharge information lies with you and/or your care-partner.

## 2018-01-01 NOTE — Progress Notes (Signed)
Called to room to assist during endoscopic procedure.  Patient ID and intended procedure confirmed with present staff. Received instructions for my participation in the procedure from the performing physician.  

## 2018-01-01 NOTE — Progress Notes (Signed)
Report to PACU, RN, vss, BBS= Clear.  

## 2018-01-02 ENCOUNTER — Telehealth: Payer: Self-pay

## 2018-01-02 NOTE — Telephone Encounter (Signed)
  Follow up Call-  Call back number 01/01/2018  Post procedure Call Back phone  # [phone]  Permission to leave phone message Yes  Some recent data might be hidden     Patient questions:  Do you have a fever, pain , or abdominal swelling? No. Pain Score  0 *  Have you tolerated food without any problems? Yes.    Have you been able to return to your normal activities? Yes.    Do you have any questions about your discharge instructions: Diet   No. Medications  No. Follow up visit  No.  Do you have questions or concerns about your Care? No.  Actions: * If pain score is 4 or above: No action needed, pain <4. Patient woke up from sleeping last night with cramping in her abdomen. She said she did pass gas and was feeling much better this morning. Advised to call us back if the cramping returned.

## 2018-01-08 ENCOUNTER — Telehealth: Payer: Self-pay | Admitting: Gastroenterology

## 2018-01-08 NOTE — Telephone Encounter (Signed)
Patient c/o constipation since her procedure on 01/01/18.  She is notified may take a few weeks to reestablish her normal bowel habits.  She has a history of constipation. She is advised to add Miralax 17 Gm 1-2 times a day until her normal bowel habits return.  She is encouraged to call back for any additional questions or concerns.

## 2018-01-08 NOTE — Telephone Encounter (Signed)
Left message for patient to call back  
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
## 2018-01-09 ENCOUNTER — Ambulatory Visit: Payer: 59 | Admitting: Neurology

## 2018-01-09 ENCOUNTER — Encounter: Payer: Self-pay | Admitting: Neurology

## 2018-01-09 DIAGNOSIS — Z0279 Encounter for issue of other medical certificate: Secondary | ICD-10-CM

## 2018-01-15 ENCOUNTER — Encounter: Payer: Self-pay | Admitting: Gastroenterology

## 2018-01-16 ENCOUNTER — Encounter: Payer: Self-pay | Admitting: Neurology

## 2018-01-17 ENCOUNTER — Ambulatory Visit: Payer: 59 | Admitting: Gynecology

## 2018-01-17 ENCOUNTER — Other Ambulatory Visit: Payer: 59

## 2018-01-18 ENCOUNTER — Other Ambulatory Visit: Payer: Self-pay | Admitting: Family Medicine

## 2018-01-18 ENCOUNTER — Telehealth: Payer: Self-pay | Admitting: Neurology

## 2018-01-18 NOTE — Telephone Encounter (Signed)
Patient dismissed from [facility] by Dr. Donnamae Jude 01/16/18. Dismissal letter sent out by 1st class mail. fbg

## 2018-01-19 NOTE — Telephone Encounter (Signed)
Name of Medication: Tramadol Name of Pharmacy: [place] or Written Date and Quantity: 12/14/17 #120 tabs with 0 refills Last Office Visit and Type: 10/02/17 appt with Dr. Lorelei Pont Next Office Visit and Type: none scheduled Last Controlled Substance Agreement Date: 09/06/12 Last UDS:09/06/12

## 2018-01-23 ENCOUNTER — Telehealth: Payer: Self-pay | Admitting: Gastroenterology

## 2018-01-23 NOTE — Telephone Encounter (Signed)
Patient reports constipation.  She reports only 3 BM since 9/30 despite daily linzess and Miralax.  She will come in tomorrow to see Alonza Bogus, PA for evaluation.

## 2018-01-23 NOTE — Telephone Encounter (Signed)
Patient states that she is having trouble having a bm since colon on 09/30 states that she tried miralax and still nothing. Wants to know what else to take

## 2018-01-24 ENCOUNTER — Other Ambulatory Visit (INDEPENDENT_AMBULATORY_CARE_PROVIDER_SITE_OTHER): Payer: 59

## 2018-01-24 ENCOUNTER — Encounter: Payer: Self-pay | Admitting: Gastroenterology

## 2018-01-24 ENCOUNTER — Ambulatory Visit (INDEPENDENT_AMBULATORY_CARE_PROVIDER_SITE_OTHER): Payer: 59 | Admitting: Gastroenterology

## 2018-01-24 ENCOUNTER — Other Ambulatory Visit: Payer: Self-pay | Admitting: Family Medicine

## 2018-01-24 VITALS — BP 122/70 | HR 80 | Ht 63.0 in | Wt 156.5 lb

## 2018-01-24 DIAGNOSIS — R6881 Early satiety: Secondary | ICD-10-CM | POA: Diagnosis not present

## 2018-01-24 DIAGNOSIS — K5904 Chronic idiopathic constipation: Secondary | ICD-10-CM | POA: Diagnosis not present

## 2018-01-24 DIAGNOSIS — Z1231 Encounter for screening mammogram for malignant neoplasm of breast: Secondary | ICD-10-CM

## 2018-01-24 DIAGNOSIS — R110 Nausea: Secondary | ICD-10-CM

## 2018-01-24 DIAGNOSIS — R1031 Right lower quadrant pain: Secondary | ICD-10-CM

## 2018-01-24 LAB — TSH: TSH: 1.13 u[IU]/mL (ref 0.35–4.50)

## 2018-01-24 NOTE — Progress Notes (Signed)
01/24/2018 JOURNEY CASTONGUAY [id number] Mar 21, 1966   HISTORY OF PRESENT ILLNESS: This is a 52 year old female who is a patient of Dr. Lynne Leader.  She had colonoscopy on September 30 at which time she was found to have two 8 mm polyps removed from her colon, one was a tubular adenoma.  She is here with ongoing complaints of chronic constipation.  She is currently on Linzess 145 mcg daily,  Miralax 1-3 times daily, and 2 dulcolax about every day with no relief.  Says that she is only had 3-4 bowel movement since her colonoscopy.  She also continues to complain of right lower quadrant abdominal pain.  Has history of pericecal hernia for which she had seen Dr. Johney Maine at [facility] surgery in the past.  She also complains of nausea/dyspepsia/early satiety.  Previous EGD and gastric emptying scan several years ago for evaluation of the same symptoms were unremarkable.  Did have H. pylori in the past and was treated.  Says she thinks that it feels the same way as she did back then before she was treated.  Has only been using omeprazole on rare occasion as needed recently.    Past Medical History:  Diagnosis Date  . 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 GERD (gastroesophageal reflux disease)   . Hiatal hernia   . HPV in female    also some abnormal paps in past  . PPD positive    Hx of  . Renal lithiasis 2013  . Sickle cell trait [facility])    Past Surgical History:  Procedure Laterality Date  . APPENDECTOMY     1990's  . BREAST EXCISIONAL BIOPSY Left 2014  . BREAST SURGERY     removal of L breast mass / benign /age [age]  . CESAREAN SECTION     x3  . CYSTOSCOPY  2008   L RPG, stone extraction ([place])  . ESOPHAGOGASTRODUODENOSCOPY  06/15/2012   Several----normal   . FOOT SURGERY  JULY 2013   RIGHT  . LAPAROSCOPIC VAGINAL HYSTERECTOMY  12/02/2010   menorrhagia, leiomyomata  . LAPAROSCOPY N/A 05/24/2012   Procedure: LAPAROSCOPY OPERATIVE;  Surgeon: Anastasio Auerbach, MD;   Location: [facility];  Service: Gynecology;  Laterality: N/A;  . PELVIC LAPAROSCOPY     x2 for cysts r/o endometriosis  . SALPINGOOPHORECTOMY Right 05/24/2012   Procedure: SALPINGO OOPHORECTOMY;  Surgeon: Anastasio Auerbach, MD;  Location: [facility];  Service: Gynecology;  Laterality: Right;  . TONSILLECTOMY AND ADENOIDECTOMY     age 6  . TUBAL LIGATION     BTL    reports that she has never smoked. She has never used smokeless tobacco. She reports that she does not drink alcohol or use drugs. family history includes Anesthesia problems in her mother; Aneurysm in her father; Breast cancer (age of onset: 28) in her maternal aunt; Cancer in her paternal aunt; Hypertension in her mother; Lupus in her cousin; Prostate cancer in her brother. Allergies  Allergen Reactions  . Banana Itching    Throat itches  . Morphine Itching      Outpatient Encounter Medications as of 01/24/2018  Medication Sig  . linaclotide (LINZESS) 145 MCG CAPS capsule Take 1 capsule (145 mcg total) by mouth daily before breakfast.  . omeprazole (PRILOSEC) 20 MG capsule Take 1 capsule (20 mg total) every morning by mouth.  . propranolol ER (INDERAL LA) 80 MG 24 hr capsule Take 1 capsule (80 mg total) by mouth daily.  Marland Kitchen  SUMAtriptan (IMITREX) 100 MG tablet Take 1 tablet earliest onset of migraine.  May repeat x1 in 2 hours if headache persists or recurs.  Marland Kitchen tiZANidine (ZANAFLEX) 2 MG tablet Take 1 tablet (2 mg total) by mouth at bedtime. (Patient taking differently: Take 2 mg by mouth 3 times/day as needed-between meals & bedtime. )  . traMADol (ULTRAM) 50 MG tablet TAKE 1 TABLET(50 MG) BY MOUTH EVERY 6 HOURS AS NEEDED (Patient taking differently: as needed. TAKE 1 TABLET(50 MG) BY MOUTH EVERY 6 HOURS AS NEEDED)  . [DISCONTINUED] albuterol (PROAIR HFA) 108 (90 BASE) MCG/ACT inhaler Inhale 2 puffs into the lungs every 4 (four) hours as needed.     No facility-administered encounter medications on file as of 01/24/2018.       REVIEW OF SYSTEMS  : All other systems reviewed and negative except where noted in the History of Present Illness.   PHYSICAL EXAM: BP 122/70   Pulse 80   Ht 5\' 3"  (1.6 m)   Wt 15 lb 8 oz (7.031 kg)   LMP 11/24/2010   BMI 2.75 kg/m  General: Well developed black female in no acute distress Head: Normocephalic and atraumatic Eyes: Sclerae anicteric, conjunctiva pink. Ears: Normal auditory acuity  Lungs: Clear throughout to auscultation; no increased WOB. Heart: Regular rate and rhythm; no M/R/G. Abdomen: Soft, non-distended.  BS present.  Mild diffuse TTP but slightly greater in RLQ. Musculoskeletal: Symmetrical with no gross deformities  Skin: No lesions on visible extremities Extremities: No edema  Neurological: Alert oriented x 4, grossly non-focal Psychological:  Alert and cooperative. Normal mood and affect  ASSESSMENT AND PLAN: *Chronic constipation:  Currently on Linzess 145 mcg daily,  Miralax 1-3 times daily, and 2 dulcolax about every day with no relief.  Will increase Linzess to 290 mcg daily.  Can still use MiraLAX as well, but will try to discontinue or decrease Dulcolax use.  Will check TSH. *RLQ abdominal pain: Has history of pericecal hernia.  Will repeat CT scan to reevaluate.  She may need to follow-up with Dr. Johney Maine from surgery as well. *Nausea/dyspepsia/early satiety: Previous EGD and gastric emptying scan several years ago for evaluation of the same symptoms were unremarkable.  Did have H. pylori in the past and was treated.  Will check H. pylori stool antigen.  After collecting stool sample she will restart her omeprazole 20 mg daily.  Had only been using it on rare occasion as needed recently.   CC:  Tower, Wynelle Fanny, MD

## 2018-01-24 NOTE — Patient Instructions (Addendum)
Your provider has requested that you go to the basement level for lab work before leaving today. Press "B" on the elevator. The lab is located at the first door on the left as you exit the elevator.  Increase your Linzess to 290 mcg daily in the evening before dinner.   After you return your stool sample, start Omeprazole 20 mg daily.   You have been scheduled for a CT scan of the abdomen and pelvis at [facility] ([address]---this is in the same building as [facility]).   You are scheduled on 01-31-18 at 2:00 pm. You should arrive 15 minutes prior to your appointment time for registration. Please follow the written instructions below on the day of your exam:  WARNING: IF YOU ARE ALLERGIC TO IODINE/X-RAY DYE, PLEASE NOTIFY RADIOLOGY IMMEDIATELY AT [phone]! YOU WILL BE GIVEN A 13 HOUR PREMEDICATION PREP.  1) Do not eat anything after 10:00 am (4 hours prior to your test) 2) You have been given 2 bottles of oral contrast to drink. The solution may taste better if refrigerated, but do NOT add ice or any other liquid to this solution. Shake well before drinking.    Drink 1 bottle of contrast @ 12:00 pm (2 hours prior to your exam)  Drink 1 bottle of contrast @ 1:00 pm (1 hour prior to your exam)  You may take any medications as prescribed with a small amount of water, if necessary. If you take any of the following medications: METFORMIN, GLUCOPHAGE, GLUCOVANCE, AVANDAMET, RIOMET, FORTAMET, Millard MET, JANUMET, GLUMETZA or METAGLIP, you MAY be asked to HOLD this medication 48 hours AFTER the exam.  The purpose of you drinking the oral contrast is to aid in the visualization of your intestinal tract. The contrast solution may cause some diarrhea. Depending on your individual set of symptoms, you may also receive an intravenous injection of x-ray contrast/dye. Plan on being at [facility] for 30 minutes or longer, depending on the type of exam you are having  performed.  This test typically takes 30-45 minutes to complete.  If you have any questions regarding your exam or if you need to reschedule, you may call the CT department at [phone] between the hours of 8:00 am and 5:00 pm, Monday-Friday.  ________________________________________________________________________

## 2018-01-25 DIAGNOSIS — R6881 Early satiety: Secondary | ICD-10-CM | POA: Insufficient documentation

## 2018-01-25 NOTE — Progress Notes (Signed)
Reviewed and agree with management plan.  Malcolm T. Stark, MD FACG 

## 2018-01-31 ENCOUNTER — Ambulatory Visit (INDEPENDENT_AMBULATORY_CARE_PROVIDER_SITE_OTHER)
Admission: RE | Admit: 2018-01-31 | Discharge: 2018-01-31 | Disposition: A | Discharge status: Home / Self Care | Payer: 59 | Source: Ambulatory Visit | Attending: Gastroenterology | Admitting: Gastroenterology

## 2018-01-31 DIAGNOSIS — K5904 Chronic idiopathic constipation: Secondary | ICD-10-CM | POA: Diagnosis not present

## 2018-01-31 MED ORDER — IOPAMIDOL (ISOVUE-300) INJECTION 61%
100.0000 mL | Freq: Once | INTRAVENOUS | Status: AC | PRN
  Administered 2018-01-31: 100 mL via INTRAVENOUS

## 2018-02-02 ENCOUNTER — Telehealth: Payer: Self-pay | Admitting: Gastroenterology

## 2018-02-02 DIAGNOSIS — R935 Abnormal findings on diagnostic imaging of other abdominal regions, including retroperitoneum: Secondary | ICD-10-CM

## 2018-02-02 NOTE — Telephone Encounter (Signed)
Referral sent to [facility], the pt has been advised

## 2018-02-02 NOTE — Telephone Encounter (Signed)
Pt needs referral for a urologist, she stated that Janett Billow wants her to see one.

## 2018-02-20 ENCOUNTER — Other Ambulatory Visit: Payer: Self-pay | Admitting: Family Medicine

## 2018-02-21 ENCOUNTER — Other Ambulatory Visit: Payer: Self-pay | Admitting: Family Medicine

## 2018-02-21 MED ORDER — TRAMADOL HCL 50 MG PO TABS: 50.0000 mg | ORAL_TABLET | Freq: Four times a day (QID) | ORAL | 0 refills | Status: DC | PRN

## 2018-02-21 NOTE — Telephone Encounter (Signed)
Name of Medication: Tramadol Name of Pharmacy: [place] or Written Date and Quantity: 01/19/18 #120 tabs with 0 refills Last Office Visit and Type: 10/02/17 appt with Dr. Lorelei Pont Next Office Visit and Type: none scheduled Last Controlled Substance Agreement Date: 09/06/12 Last UDS:09/06/12

## 2018-03-05 ENCOUNTER — Ambulatory Visit: Payer: 59

## 2018-03-05 ENCOUNTER — Telehealth: Payer: Self-pay | Admitting: Family Medicine

## 2018-03-05 DIAGNOSIS — Z0000 Encounter for general adult medical examination without abnormal findings: Secondary | ICD-10-CM

## 2018-03-05 NOTE — Telephone Encounter (Signed)
-----   Message from Lendon Collar, RT sent at 02/26/2018 12:23 PM EST ----- Regarding: Lab orders for Tuesday 03/06/18 Please enter CPE lab orders for 03/06/18. Thanks!

## 2018-03-06 ENCOUNTER — Other Ambulatory Visit: Payer: 59

## 2018-03-07 ENCOUNTER — Other Ambulatory Visit (INDEPENDENT_AMBULATORY_CARE_PROVIDER_SITE_OTHER): Payer: 59

## 2018-03-07 DIAGNOSIS — Z0000 Encounter for general adult medical examination without abnormal findings: Secondary | ICD-10-CM | POA: Diagnosis not present

## 2018-03-07 LAB — COMPREHENSIVE METABOLIC PANEL
ALT: 23 U/L (ref 0–35)
AST: 30 U/L (ref 0–37)
Albumin: 4.2 g/dL (ref 3.5–5.2)
Alkaline Phosphatase: 62 U/L (ref 39–117)
BUN: 6 mg/dL (ref 6–23)
CO2: 26 mEq/L (ref 19–32)
Calcium: 9.4 mg/dL (ref 8.4–10.5)
Chloride: 104 mEq/L (ref 96–112)
Creatinine, Ser: 0.87 mg/dL (ref 0.40–1.20)
GFR: 87.86 mL/min (ref >60.00)
Glucose, Bld: 117 mg/dL — ABNORMAL HIGH (ref 70–99)
Potassium: 4.1 mEq/L (ref 3.5–5.1)
Sodium: 137 mEq/L (ref 135–145)
Total Bilirubin: 0.4 mg/dL (ref 0.2–1.2)
Total Protein: 7 g/dL (ref 6.0–8.3)

## 2018-03-07 LAB — LIPID PANEL
Cholesterol: 200 mg/dL (ref 0–200)
HDL: 51.6 mg/dL (ref >39.00)
LDL Cholesterol: 128 mg/dL — ABNORMAL HIGH (ref 0–99)
NONHDL: 148.04
Total CHOL/HDL Ratio: 4
Triglycerides: 98 mg/dL (ref 0.0–149.0)
VLDL: 19.6 mg/dL (ref 0.0–40.0)

## 2018-03-07 LAB — CBC WITH DIFFERENTIAL/PLATELET
Basophils Absolute: 0 10*3/uL (ref 0.0–0.1)
Basophils Relative: 0.3 % (ref 0.0–3.0)
Eosinophils Absolute: 0.2 10*3/uL (ref 0.0–0.7)
Eosinophils Relative: 2.2 % (ref 0.0–5.0)
HCT: 38.6 % (ref 36.0–46.0)
Hemoglobin: 13 g/dL (ref 12.0–15.0)
LYMPHS ABS: 4.1 10*3/uL — AB (ref 0.7–4.0)
Lymphocytes Relative: 50.1 % — ABNORMAL HIGH (ref 12.0–46.0)
MCHC: 33.6 g/dL (ref 30.0–36.0)
MCV: 78.7 fl (ref 78.0–100.0)
Monocytes Absolute: 0.7 10*3/uL (ref 0.1–1.0)
Monocytes Relative: 8.7 % (ref 3.0–12.0)
NEUTROS PCT: 38.7 % — AB (ref 43.0–77.0)
Neutro Abs: 3.2 10*3/uL (ref 1.4–7.7)
Platelets: 342 10*3/uL (ref 150.0–400.0)
RBC: 4.9 Mil/uL (ref 3.87–5.11)
RDW: 13.9 % (ref 11.5–15.5)
WBC: 8.2 10*3/uL (ref 4.0–10.5)

## 2018-03-07 LAB — TSH: TSH: 1.64 u[IU]/mL (ref 0.35–4.50)

## 2018-03-09 ENCOUNTER — Other Ambulatory Visit: Payer: Self-pay | Admitting: Urology

## 2018-03-09 DIAGNOSIS — N130 Hydronephrosis with ureteropelvic junction obstruction: Secondary | ICD-10-CM

## 2018-03-13 ENCOUNTER — Ambulatory Visit (INDEPENDENT_AMBULATORY_CARE_PROVIDER_SITE_OTHER): Payer: 59 | Admitting: Family Medicine

## 2018-03-13 ENCOUNTER — Encounter: Payer: Self-pay | Admitting: Family Medicine

## 2018-03-13 VITALS — BP 116/68 | HR 75 | Temp 98.3°F | Ht 63.0 in | Wt 156.0 lb

## 2018-03-13 DIAGNOSIS — Z23 Encounter for immunization: Secondary | ICD-10-CM

## 2018-03-13 DIAGNOSIS — Z87442 Personal history of urinary calculi: Secondary | ICD-10-CM

## 2018-03-13 DIAGNOSIS — Z0000 Encounter for general adult medical examination without abnormal findings: Secondary | ICD-10-CM | POA: Diagnosis not present

## 2018-03-13 DIAGNOSIS — G43009 Migraine without aura, not intractable, without status migrainosus: Secondary | ICD-10-CM

## 2018-03-13 DIAGNOSIS — K5904 Chronic idiopathic constipation: Secondary | ICD-10-CM | POA: Diagnosis not present

## 2018-03-13 DIAGNOSIS — E785 Hyperlipidemia, unspecified: Secondary | ICD-10-CM | POA: Insufficient documentation

## 2018-03-13 NOTE — Assessment & Plan Note (Signed)
Triggered most of the time by neck pain/tension  Exercise and strength training has helped  Has seen Dr Tomi Likens in neurology as well as Dr Lorelei Pont in sport med  Not ready for neck injections at this time Uses tramadol prn -disc pot for rebound headaches (also sedation and habit) with this - I do not recommend this as first line for headache  Disc good hydration and favorable lifestyle changes

## 2018-03-13 NOTE — Progress Notes (Signed)
Subjective:    Patient ID: [name], female    DOB: Aug 26, 1965, 52 y.o.   MRN: [id number]  HPI Here for health maintenance exam and to review chronic medical problems    Doing ok overall  Works out regularly  Helps her neck and shoulders   (and headache)   Wt Readings from Last 3 Encounters:  03/13/18 156 lb (70.8 kg)  01/24/18 156 lb 8 oz (71 kg)  01/01/18 158 lb (71.7 kg)  going to gym  Eating healthy  Plant based diet currently except from diet (protein from nuts and peanut butter, occasional dairy and eggs)  27.63 kg/m   Tetanus shot 4/09-will get today  Flu shot - also getting today   Has a renal study planned - (has hx of stones) - told one kidney is larger than the other (R side) with some pain  Urology follows her ([facility])    [place] -has it scheduled 1/13 (sees gyn)  Self breast exam -no lumps  One time had nipple d/c (her gyn is aware) -also had a sore on breast that was drained  (a while ago)   Had GYN visit in November - sees [name]  She thinks her pap was nl then (we will send for that )  Colonoscopy 9/19 -adenomatous polyp  5 year recall   H/o migraines - headaches she thinks come from neck tension/pain and deg disc dz  Saw Cindy Tomi Likens Then Cindy Lorelei Pont  Continues to have headaches = battles them  May consider injections later on   Chronic constipation- tales linzess 145 mg  Now that she is eating better-it helps more  Drinks lots of water - can always drink more   Lab Results  Component Value Date   CREATININE 0.87 03/07/2018   BUN 6 03/07/2018   NA 137 03/07/2018   K 4.1 03/07/2018   CL 104 03/07/2018   CO2 26 03/07/2018    Lab Results  Component Value Date   ALT 23 03/07/2018   AST 30 03/07/2018   ALKPHOS 62 03/07/2018   BILITOT 0.4 03/07/2018    Lab Results  Component Value Date   WBC 8.2 03/07/2018   HGB 13.0 03/07/2018   HCT 38.6 03/07/2018   MCV 78.7 03/07/2018   PLT 342.0 03/07/2018    Glucose was 117 = non fasting    Cholesterol  Lab Results  Component Value Date   CHOL 200 03/07/2018   CHOL 205 (H) 12/14/2015   CHOL 185 11/17/2014   Lab Results  Component Value Date   HDL 51.60 03/07/2018   HDL 53.90 12/14/2015   HDL 46.80 11/17/2014   Lab Results  Component Value Date   LDLCALC 128 (H) 03/07/2018   LDLCALC 132 (H) 12/14/2015   LDLCALC 122 (H) 11/17/2014   Lab Results  Component Value Date   TRIG 98.0 03/07/2018   TRIG 96.0 12/14/2015   TRIG 80.0 11/17/2014   Lab Results  Component Value Date   CHOLHDL 4 03/07/2018   CHOLHDL 4 12/14/2015   CHOLHDL 4 11/17/2014   No results found for: LDLDIRECT ? Genetic /mother has high cholesterol  Diet is very low fat   Lab Results  Component Value Date   TSH 1.64 03/07/2018    Patient Active Problem List   Diagnosis Date Noted  . Early satiety 01/25/2018  . Chronic idiopathic constipation 01/24/2018  . Hernia, abdominal 12/14/2015  . Migraine without aura and without status migrainosus, not intractable 05/28/2015  . Family  history of brain aneurysm 01/07/2013  . Eczema 12/21/2012  . Routine general medical examination at a health care facility 08/08/2012  . Nausea without vomiting 01/07/2009  . GERD 08/20/2008  . RENAL CALCULUS, HX OF 08/20/2008  . RLQ abdominal pain 05/15/2008   Past Medical History:  Diagnosis Date  . Aneurysm ([place]) 2014   small brain 3 - being watched  . GERD (gastroesophageal reflux disease)   . Hiatal hernia   . HPV in female    also some abnormal paps in past  . PPD positive    Hx of  . Renal lithiasis 2013  . Sickle cell trait [facility])    Past Surgical History:  Procedure Laterality Date  . APPENDECTOMY     1990's  . BREAST EXCISIONAL BIOPSY Left 2014  . BREAST SURGERY     removal of L breast mass / benign /age 75-20  . CESAREAN SECTION     x3  . CYSTOSCOPY  2008   L RPG, stone extraction ([place])  . ESOPHAGOGASTRODUODENOSCOPY  06/15/2012   Several----normal   . FOOT SURGERY  JULY 2013    RIGHT  . LAPAROSCOPIC VAGINAL HYSTERECTOMY  12/02/2010   menorrhagia, leiomyomata  . LAPAROSCOPY N/A 05/24/2012   Procedure: LAPAROSCOPY OPERATIVE;  Surgeon: Anastasio Auerbach, MD;  Location: [facility];  Service: Gynecology;  Laterality: N/A;  . PELVIC LAPAROSCOPY     x2 for cysts r/o endometriosis  . SALPINGOOPHORECTOMY Right 05/24/2012   Procedure: SALPINGO OOPHORECTOMY;  Surgeon: Anastasio Auerbach, MD;  Location: [facility];  Service: Gynecology;  Laterality: Right;  . TONSILLECTOMY AND ADENOIDECTOMY     age 59  . TUBAL LIGATION     BTL   Social History   Tobacco Use  . Smoking status: Never Smoker  . Smokeless tobacco: Never Used  Substance Use Topics  . Alcohol use: No    Alcohol/week: 0.0 standard drinks  . Drug use: No   Family History  Problem Relation Age of Onset  . Anesthesia problems Mother   . Hypertension Mother   . Aneurysm Father   . Breast cancer Maternal Aunt 51  . Cancer Paternal Aunt        uterine CA  . Lupus Cousin   . Prostate cancer Brother   . Colon cancer Neg Hx   . Esophageal cancer Neg Hx   . Stomach cancer Neg Hx   . Rectal cancer Neg Hx    Allergies  Allergen Reactions  . Banana Itching    Throat itches  . Morphine Itching   Current Outpatient Medications on File Prior to Visit  Medication Sig Dispense Refill  . linaclotide (LINZESS) 145 MCG CAPS capsule Take 1 capsule (145 mcg total) by mouth daily before breakfast. 30 capsule 11  . omeprazole (PRILOSEC) 20 MG capsule Take 1 capsule (20 mg total) every morning by mouth. 30 capsule 1  . propranolol ER (INDERAL LA) 80 MG 24 hr capsule Take 1 capsule (80 mg total) by mouth daily. 30 capsule 5  . SUMAtriptan (IMITREX) 100 MG tablet Take 1 tablet earliest onset of migraine.  May repeat x1 in 2 hours if headache persists or recurs. 10 tablet 5  . tiZANidine (ZANAFLEX) 2 MG tablet Take 1 tablet (2 mg total) by mouth at bedtime. (Patient taking differently: Take 2 mg by mouth 3 times/day as  needed-between meals & bedtime. ) 30 tablet 2  . traMADol (ULTRAM) 50 MG tablet Take 1 tablet (50 mg total) by mouth every 6 (six)  hours as needed. 120 tablet 0  . [DISCONTINUED] albuterol (PROAIR HFA) 108 (90 BASE) MCG/ACT inhaler Inhale 2 puffs into the lungs every 4 (four) hours as needed.       No current facility-administered medications on file prior to visit.     Review of Systems  Constitutional: Negative for activity change, appetite change, fatigue, fever and unexpected weight change.  HENT: Negative for congestion, ear pain, rhinorrhea, sinus pressure and sore throat.   Eyes: Negative for pain, redness and visual disturbance.  Respiratory: Negative for cough, shortness of breath and wheezing.   Cardiovascular: Negative for chest pain and palpitations.  Gastrointestinal: Negative for abdominal pain, blood in stool, constipation and diarrhea.  Endocrine: Negative for polydipsia and polyuria.  Genitourinary: Negative for dysuria, frequency and urgency.  Musculoskeletal: Positive for neck pain. Negative for arthralgias, back pain and myalgias.  Skin: Negative for pallor and rash.  Allergic/Immunologic: Negative for environmental allergies.  Neurological: Positive for headaches. Negative for dizziness, tremors, seizures, syncope, facial asymmetry, speech difficulty, weakness, light-headedness and numbness.  Hematological: Negative for adenopathy. Does not bruise/bleed easily.  Psychiatric/Behavioral: Negative for decreased concentration and dysphoric mood. The patient is not nervous/anxious.        Objective:   Physical Exam  Constitutional: She appears well-developed and well-nourished. No distress.  Well appearing   HENT:  Head: Normocephalic and atraumatic.  Right Ear: External ear normal.  Left Ear: External ear normal.  Mouth/Throat: Oropharynx is clear and moist.  Eyes: Pupils are equal, round, and reactive to light. Conjunctivae and EOM are normal. No scleral icterus.    Neck: Normal range of motion. Neck supple. No JVD present. Carotid bruit is not present. No thyromegaly present.  Cardiovascular: Normal rate, regular rhythm, normal heart sounds and intact distal pulses. Exam reveals no gallop.  Pulmonary/Chest: Effort normal and breath sounds normal. No respiratory distress. She has no wheezes. She exhibits no tenderness. No breast tenderness, discharge or bleeding.  Abdominal: Soft. Bowel sounds are normal. She exhibits no distension, no abdominal bruit and no mass. There is no tenderness.  Genitourinary: No breast tenderness, discharge or bleeding.  Genitourinary Comments: Gyn does pelvic/breast exams  Musculoskeletal: Normal range of motion. She exhibits no edema, tenderness or deformity.  Lymphadenopathy:    She has no cervical adenopathy.  Neurological: She is alert. She has normal reflexes. She displays normal reflexes. No cranial nerve deficit. She exhibits normal muscle tone. Coordination normal.  Skin: Skin is warm and dry. No rash noted. No erythema. No pallor.  Few skin tags  Psychiatric: She has a normal mood and affect.  Pleasant           Assessment & Plan:   Problem List Items Addressed This Visit      Cardiovascular and Mediastinum   Migraine without aura and without status migrainosus, not intractable    Triggered most of the time by neck pain/tension  Exercise and strength training has helped  Has seen Cindy Tomi Likens in neurology as well as Cindy Lorelei Pont in sport med  Not ready for neck injections at this time Uses tramadol prn -disc pot for rebound headaches (also sedation and habit) with this - I do not recommend this as first line for headache  Disc good hydration and favorable lifestyle changes         Digestive   Chronic idiopathic constipation    Controlled by healthy diet and linzess utd with colonoscopy        Other   Routine general medical  examination at a health care facility - Primary    Reviewed health habits  including diet and exercise and skin cancer prevention Reviewed appropriate screening tests for age  Also reviewed health mt list, fam hx and immunization status , as well as social and family history   See HPI Labs reviewed  Good lifestyle habits  LDL cholesterol is up - may be genetic (enc healthy diet) Flu vaccine today  Td vaccine today      RENAL CALCULUS, HX OF    Sees urology  For renal nuclear study soon       Mild hyperlipidemia    LDL of 128 with goal of less than 130  Will continue to follow Disc goals for lipids and reasons to control them Rev last labs with pt Rev low sat fat diet in detail        Other Visit Diagnoses    Need for influenza vaccination       Relevant Orders   Flu Vaccine QUAD 6+ mos PF IM (Fluarix Quad PF) (Completed)   Need for Td vaccine       Relevant Orders   Td : Tetanus/diphtheria >7yo Preservative  free (Completed)

## 2018-03-13 NOTE — Assessment & Plan Note (Addendum)
LDL of 128 with goal of less than 130  Will continue to follow Disc goals for lipids and reasons to control them Rev last labs with pt Rev low sat fat diet in detail

## 2018-03-13 NOTE — Patient Instructions (Signed)
Flu shot today  Tetanus shot today   We will send for your last pap report  Get your mammogram as planned  For cholesterol  Avoid red meat/ fried foods/ egg yolks/ fatty breakfast meats/ butter, cheese and high fat dairy/ and shellfish

## 2018-03-13 NOTE — Assessment & Plan Note (Signed)
Controlled by healthy diet and linzess utd with colonoscopy

## 2018-03-13 NOTE — Assessment & Plan Note (Signed)
Sees urology  For renal nuclear study soon

## 2018-03-13 NOTE — Assessment & Plan Note (Addendum)
Reviewed health habits including diet and exercise and skin cancer prevention Reviewed appropriate screening tests for age  Also reviewed health mt list, fam hx and immunization status , as well as social and family history   See HPI Labs reviewed  Good lifestyle habits  LDL cholesterol is up - may be genetic (enc healthy diet) Flu vaccine today  Td vaccine today

## 2018-03-20 ENCOUNTER — Encounter (HOSPITAL_COMMUNITY)
Admission: RE | Admit: 2018-03-20 | Discharge: 2018-03-20 | Disposition: A | Discharge status: Home / Self Care | Payer: 59 | Source: Ambulatory Visit | Attending: Urology | Admitting: Urology

## 2018-03-20 DIAGNOSIS — N130 Hydronephrosis with ureteropelvic junction obstruction: Secondary | ICD-10-CM | POA: Diagnosis present

## 2018-03-20 MED ORDER — FUROSEMIDE 10 MG/ML IJ SOLN
INTRAMUSCULAR | Status: AC
  Filled 2018-03-20: qty 4

## 2018-03-20 MED ORDER — FUROSEMIDE 10 MG/ML IJ SOLN
35.0000 mg | Freq: Once | INTRAMUSCULAR | Status: AC
  Administered 2018-03-20: 35 mg via INTRAVENOUS

## 2018-03-20 MED ORDER — TECHNETIUM TC 99M MERTIATIDE
4.9000 | Freq: Once | INTRAVENOUS | Status: AC | PRN
  Administered 2018-03-20: 4.9 via INTRAVENOUS

## 2018-03-22 ENCOUNTER — Other Ambulatory Visit: Payer: Self-pay | Admitting: Family Medicine

## 2018-03-22 NOTE — Telephone Encounter (Signed)
Electronic refill request. Tramadol Last office visit:   03/13/18 CPE Last Filled:    120 tablet 0 02/21/2018  Please advise.

## 2018-04-16 ENCOUNTER — Ambulatory Visit
Admission: RE | Admit: 2018-04-16 | Discharge: 2018-04-16 | Disposition: A | Discharge status: Home / Self Care | Payer: 59 | Source: Ambulatory Visit | Attending: Family Medicine | Admitting: Family Medicine

## 2018-04-16 DIAGNOSIS — Z1231 Encounter for screening mammogram for malignant neoplasm of breast: Secondary | ICD-10-CM

## 2018-04-22 ENCOUNTER — Other Ambulatory Visit: Payer: Self-pay | Admitting: Family Medicine

## 2018-04-23 NOTE — Telephone Encounter (Signed)
Name of Medication: Tramadol Name of Pharmacy: [facility] on file Last Fill or Written Date and Quantity: 03/23/18 #120 tabs with 0 refills  Last Office Visit and Type: CPE on 03/13/18 Next Office Visit and Type: None scheduled  Last Controlled Substance Agreement Date: 09/05/12 Last UDS:09/05/12

## 2018-05-25 ENCOUNTER — Other Ambulatory Visit: Payer: Self-pay | Admitting: Family Medicine

## 2018-05-25 NOTE — Telephone Encounter (Signed)
Name of Medication: Tramadol Name of Pharmacy: [facility] on file Last Fill or Written Date and Quantity: 04/23/18 #120 tabs with 0 refills  Last Office Visit and Type: CPE on 03/13/18 Next Office Visit and Type: None scheduled  Last Controlled Substance Agreement Date: 09/05/12 Last UDS:09/05/12

## 2018-06-20 ENCOUNTER — Other Ambulatory Visit: Payer: Self-pay | Admitting: Family Medicine

## 2018-06-21 NOTE — Telephone Encounter (Signed)
Name of [place] Name of [place] on file [place] or Written Date and Quantity:05/25/18 #120 tabs with 0 refills Last Office Visit and Type:CPE on 03/13/18 Next Office Visit and Type:None scheduled Last Controlled Substance Agreement Date:09/05/12 Last UDS:09/05/12

## 2018-07-19 ENCOUNTER — Other Ambulatory Visit: Payer: Self-pay | Admitting: Family Medicine

## 2018-07-20 NOTE — Telephone Encounter (Signed)
Name of [place] Name of Pharmacy:[facility] on file Last Fill or Written Date and Quantity:06/22/18#120 tabs with 0 refills Last Office Visit and Type:CPE on 03/13/18 Next Office Visit and Type:None scheduled Last Controlled Substance Agreement Date:09/05/12 Last UDS:09/05/12

## 2018-08-16 ENCOUNTER — Other Ambulatory Visit: Payer: Self-pay | Admitting: Family Medicine

## 2018-08-16 NOTE — Telephone Encounter (Signed)
Name of [place] Name of [place] on file Last Fill or Written Date and Quantity:07/20/18#120 tabs with 0 refills Last Office Visit and Type:CPE on 03/13/18 Next Office Visit and Type:None scheduled Last Controlled Substance Agreement Date:09/05/12 Last UDS:09/05/12

## 2018-09-17 ENCOUNTER — Other Ambulatory Visit: Payer: Self-pay | Admitting: Family Medicine

## 2018-09-17 NOTE — Telephone Encounter (Signed)
Name of [place] Name of [place] on file Last Fill or Written Date and Quantity:08/17/18#120 tabs with 0 refills Last Office Visit and Type:CPE on 03/13/18 Next Office Visit and Type:None scheduled Last Controlled Substance Agreement Date:09/05/12 Last UDS:09/05/12

## 2018-10-17 ENCOUNTER — Other Ambulatory Visit: Payer: Self-pay | Admitting: Family Medicine

## 2018-10-18 NOTE — Telephone Encounter (Signed)
Name of [name] Name of [place] on file Last Fill or Written Date and Quantity:09/17/18#120 tabs with 0 refills Last Office Visit and Type:CPE on 03/13/18 Next Office Visit and Type:None scheduled Last Controlled Substance Agreement Date:09/05/12 Last UDS:09/05/12

## 2018-11-14 ENCOUNTER — Other Ambulatory Visit: Payer: Self-pay | Admitting: Family Medicine

## 2018-11-14 NOTE — Telephone Encounter (Signed)
Name of [place] Name of Pharmacy:[facility] on file Last Fill or Written Date and Quantity:10/18/18#120 tabs with 0 refills Last Office Visit and Type:CPE on 03/13/18 Next Office Visit and Type:None scheduled Last Controlled Substance Agreement Date:09/05/12 Last UDS:09/05/12

## 2018-12-13 ENCOUNTER — Other Ambulatory Visit: Payer: Self-pay | Admitting: Family Medicine

## 2018-12-14 ENCOUNTER — Other Ambulatory Visit: Payer: Self-pay

## 2018-12-14 NOTE — Telephone Encounter (Signed)
Name of [place] Name of [place] on file Last Fill or Written Date and Quantity:11/1318#120 tabs with 0 refills Last Office Visit and Type:CPE on 03/13/18 Next Office Visit and Type:CPE on 03/12/19 Last Controlled Substance Agreement Date:09/05/12 Last UDS:09/05/12

## 2018-12-17 ENCOUNTER — Ambulatory Visit (INDEPENDENT_AMBULATORY_CARE_PROVIDER_SITE_OTHER): Payer: 59 | Admitting: Gynecology

## 2018-12-17 ENCOUNTER — Encounter: Payer: Self-pay | Admitting: Gynecology

## 2018-12-17 ENCOUNTER — Other Ambulatory Visit: Payer: Self-pay

## 2018-12-17 VITALS — BP 118/76 | Ht 63.0 in | Wt 166.0 lb

## 2018-12-17 DIAGNOSIS — R1031 Right lower quadrant pain: Secondary | ICD-10-CM

## 2018-12-17 DIAGNOSIS — Z01419 Encounter for gynecological examination (general) (routine) without abnormal findings: Secondary | ICD-10-CM

## 2018-12-17 DIAGNOSIS — N898 Other specified noninflammatory disorders of vagina: Secondary | ICD-10-CM | POA: Diagnosis not present

## 2018-12-17 DIAGNOSIS — G8929 Other chronic pain: Secondary | ICD-10-CM

## 2018-12-17 LAB — WET PREP FOR TRICH, YEAST, CLUE

## 2018-12-17 MED ORDER — FLUCONAZOLE 150 MG PO TABS: 150.0000 mg | ORAL_TABLET | Freq: Once | ORAL | 0 refills | Status: AC

## 2018-12-17 NOTE — Addendum Note (Signed)
Addended by: Nelva Nay on: 12/17/2018 04:28 PM   Modules accepted: Orders

## 2018-12-17 NOTE — Patient Instructions (Signed)
Take the one Diflucan pill.  Follow-up if the vaginal itching continues.  Follow-up with urology as they recommend.  Follow-up in 1 year for annual exam.

## 2018-12-17 NOTE — Progress Notes (Signed)
    [name] 1965-04-10 [id number]        53 y.o.  [id number] for annual gynecologic exam.  Continues to have issues with chronic right lower quadrant pain.  Is actively being seen by urology with apparent sluggish drainage on her right renal system but no overt obstruction.  Also notes occasional vulvar itching.  No discharge or odor.  Past medical history,surgical history, problem list, medications, allergies, family history and social history were all reviewed and documented as reviewed in the EPIC chart.  ROS:  Performed with pertinent positives and negatives included in the history, assessment and plan.   Additional significant findings : None   Exam: [name] assistant Vitals:   12/17/18 1545  BP: 118/76  Weight: 166 lb (75.3 kg)  Height: 5\' 3"  (1.6 m)   Body mass index is 29.41 kg/m.  General appearance:  Normal affect, orientation and appearance. Skin: Grossly normal HEENT: Without gross lesions.  No cervical or supraclavicular adenopathy. Thyroid normal.  Lungs:  Clear without wheezing, rales or rhonchi Cardiac: RR, without RMG Abdominal:  Soft, nontender, without masses, guarding, rebound, organomegaly or hernia Breasts:  Examined lying and sitting without masses, retractions, discharge or axillary adenopathy.  Mild fullness in the right axilla consistent with ectopic breast tissue Pelvic:  Ext, BUS, Vagina: Normal excepting slight white discharge.  Adnexa: Without masses or tenderness    Anus and perineum: Normal   Rectovaginal: Normal sphincter tone without palpated masses or tenderness.    Assessment/Plan:  53 y.o. [id number] female for annual gynecologic exam.  Status post LAVH 2012 with subsequent RSO 2014.  1. Perimenopausal.  No significant menopausal symptoms.  Will monitor and follow-up if symptoms develop. 2. Vulvar itching.  Patient notes some vulvar itching on and off.  No history of discharge.  Exam shows slight white discharge.  KOH wet prep positive  for yeast.  Will treat with Diflucan 150 mg x 1 dose.  Follow-up if symptoms persist, worsen or recur. 3. Chronic right lower quadrant pain.  Had been evaluated several times in the past.  Most recently with October 2019 CT scan which was normal with exception of mild right renal pelviectasis.  Has been followed by urology several months ago historically.  We again discussed other possible etiologies to include scar tissue.  No evidence of GYN etiology noting RSO in the past and CT showing no adnexal pathology on the left.  Pelvic exam is normal.  At this point patient is going to monitor and follow-up with urology. 4. Mammography 04/2018.  Continue with annual mammography when due.  Mild fullness in the right axilla consistent with ectopic breast tissue that is been worked up years previously.  She saw general surgeon who discussed excision versus observation and they both agreed to follow.  Breast exam proper today is normal. 5. Pap smear 2018.  Pap smear done today.  History of abnormal Pap smears a number of years ago with normal Pap smears since. 6. Health maintenance.  No routine lab work done as patient reports that it is done elsewhere.  Follow-up 1 year, sooner as needed.   Anastasio Auerbach MD, 4:16 PM 12/17/2018

## 2018-12-18 LAB — PAP IG W/ RFLX HPV ASCU

## 2018-12-26 ENCOUNTER — Encounter: Payer: Self-pay | Admitting: Gynecology

## 2019-01-08 ENCOUNTER — Other Ambulatory Visit: Payer: Self-pay | Admitting: Family Medicine

## 2019-01-08 NOTE — Telephone Encounter (Signed)
Name of [place] Name of [place] on file Last Fill or Written Date and Quantity:12/14/18#120 tabs with 0 refills Last Office Visit and Type:CPE on 03/13/18 Next Office Visit and Type:CPE on 03/12/19 Last Controlled Substance Agreement Date:09/05/12 Last UDS:09/05/12

## 2019-01-08 NOTE — Telephone Encounter (Signed)
She filled a 30 day supply on 12/14/18 so this request is early

## 2019-01-10 ENCOUNTER — Other Ambulatory Visit: Payer: Self-pay | Admitting: Family Medicine

## 2019-01-10 NOTE — Telephone Encounter (Signed)
Name of [name] Name of [name] on file Last Fill or Written Date and Quantity:12/14/18#120 tabs with 0 refills Last Office Visit and Type:CPE on 03/13/18 Next Office Visit and Type:CPE on 03/12/19 Last Controlled Substance Agreement Date:09/05/12 Last UDS:09/05/12

## 2019-01-11 MED ORDER — TRAMADOL HCL 50 MG PO TABS: 50.0000 mg | ORAL_TABLET | Freq: Four times a day (QID) | ORAL | 0 refills | Status: DC | PRN

## 2019-01-17 ENCOUNTER — Ambulatory Visit (INDEPENDENT_AMBULATORY_CARE_PROVIDER_SITE_OTHER): Payer: 59

## 2019-01-17 DIAGNOSIS — Z23 Encounter for immunization: Secondary | ICD-10-CM | POA: Diagnosis not present

## 2019-02-08 ENCOUNTER — Other Ambulatory Visit: Payer: Self-pay | Admitting: Family Medicine

## 2019-02-08 MED ORDER — TRAMADOL HCL 50 MG PO TABS: 50.0000 mg | ORAL_TABLET | Freq: Four times a day (QID) | ORAL | 0 refills | Status: DC | PRN

## 2019-02-08 NOTE — Telephone Encounter (Signed)
Name of [place] Name of [place] on file Last Fill or Written Date and Quantity:01/11/19#120 tabs with 0 refills Last Office Visit and Type:CPE on 03/13/18 Next Office Visit and Type:CPE on 03/12/19 Last Controlled Substance Agreement Date:09/05/12 Last UDS:09/05/12

## 2019-02-26 ENCOUNTER — Telehealth: Payer: Self-pay | Admitting: Family Medicine

## 2019-02-26 DIAGNOSIS — Z0000 Encounter for general adult medical examination without abnormal findings: Secondary | ICD-10-CM

## 2019-02-26 DIAGNOSIS — E785 Hyperlipidemia, unspecified: Secondary | ICD-10-CM

## 2019-02-26 NOTE — Telephone Encounter (Signed)
-----   Message from Ellamae Sia sent at 02/19/2019  3:45 PM EST ----- Regarding: Lab orders for Tuesday, 12.1.20 Patient is scheduled for CPX labs, please order future labs, Thanks , Karna Christmas

## 2019-03-05 ENCOUNTER — Other Ambulatory Visit: Payer: Self-pay

## 2019-03-05 ENCOUNTER — Other Ambulatory Visit (INDEPENDENT_AMBULATORY_CARE_PROVIDER_SITE_OTHER): Payer: 59

## 2019-03-05 DIAGNOSIS — E785 Hyperlipidemia, unspecified: Secondary | ICD-10-CM | POA: Diagnosis not present

## 2019-03-05 DIAGNOSIS — Z0000 Encounter for general adult medical examination without abnormal findings: Secondary | ICD-10-CM

## 2019-03-05 LAB — CBC WITH DIFFERENTIAL/PLATELET
Basophils Absolute: 0.1 10*3/uL (ref 0.0–0.1)
Basophils Relative: 0.7 % (ref 0.0–3.0)
Eosinophils Absolute: 0.2 10*3/uL (ref 0.0–0.7)
Eosinophils Relative: 2.3 % (ref 0.0–5.0)
HCT: 38.1 % (ref 36.0–46.0)
Hemoglobin: 12.6 g/dL (ref 12.0–15.0)
Lymphocytes Relative: 44.2 % (ref 12.0–46.0)
Lymphs Abs: 3.4 10*3/uL (ref 0.7–4.0)
MCHC: 32.9 g/dL (ref 30.0–36.0)
MCV: 79.5 fl (ref 78.0–100.0)
Monocytes Absolute: 0.6 10*3/uL (ref 0.1–1.0)
Monocytes Relative: 8.1 % (ref 3.0–12.0)
Neutro Abs: 3.4 10*3/uL (ref 1.4–7.7)
Neutrophils Relative %: 44.7 % (ref 43.0–77.0)
Platelets: 314 10*3/uL (ref 150.0–400.0)
RBC: 4.79 Mil/uL (ref 3.87–5.11)
RDW: 14.2 % (ref 11.5–15.5)
WBC: 7.7 10*3/uL (ref 4.0–10.5)

## 2019-03-05 LAB — LIPID PANEL
Cholesterol: 184 mg/dL (ref 0–200)
HDL: 41.9 mg/dL (ref >39.00)
LDL Cholesterol: 129 mg/dL — ABNORMAL HIGH (ref 0–99)
NonHDL: 142.42
Total CHOL/HDL Ratio: 4
Triglycerides: 66 mg/dL (ref 0.0–149.0)
VLDL: 13.2 mg/dL (ref 0.0–40.0)

## 2019-03-05 LAB — TSH: TSH: 1.87 u[IU]/mL (ref 0.35–4.50)

## 2019-03-05 LAB — COMPREHENSIVE METABOLIC PANEL
ALT: 9 U/L (ref 0–35)
AST: 10 U/L (ref 0–37)
Albumin: 3.9 g/dL (ref 3.5–5.2)
Alkaline Phosphatase: 65 U/L (ref 39–117)
BUN: 8 mg/dL (ref 6–23)
CO2: 25 mEq/L (ref 19–32)
Calcium: 8.7 mg/dL (ref 8.4–10.5)
Chloride: 106 mEq/L (ref 96–112)
Creatinine, Ser: 0.87 mg/dL (ref 0.40–1.20)
GFR: 82.35 mL/min (ref >60.00)
Glucose, Bld: 118 mg/dL — ABNORMAL HIGH (ref 70–99)
Potassium: 3.5 mEq/L (ref 3.5–5.1)
Sodium: 139 mEq/L (ref 135–145)
Total Bilirubin: 0.6 mg/dL (ref 0.2–1.2)
Total Protein: 6.6 g/dL (ref 6.0–8.3)

## 2019-03-07 ENCOUNTER — Other Ambulatory Visit: Payer: Self-pay | Admitting: Family Medicine

## 2019-03-07 NOTE — Telephone Encounter (Signed)
Name of [place] Name of [place] on file Last Fill or Written Date and Quantity:02/08/19#120 tabs with 0 refills Last Office Visit and Type:CPE on 03/13/18 Next Office Visit and Type:CPE on 03/12/19 Last Controlled Substance Agreement Date:09/05/12 Last UDS:09/05/12

## 2019-03-08 MED ORDER — TRAMADOL HCL 50 MG PO TABS: 50.0000 mg | ORAL_TABLET | Freq: Four times a day (QID) | ORAL | 0 refills | Status: DC | PRN

## 2019-03-12 ENCOUNTER — Ambulatory Visit (INDEPENDENT_AMBULATORY_CARE_PROVIDER_SITE_OTHER): Payer: 59 | Admitting: Family Medicine

## 2019-03-12 ENCOUNTER — Encounter: Payer: Self-pay | Admitting: Family Medicine

## 2019-03-12 ENCOUNTER — Other Ambulatory Visit: Payer: Self-pay

## 2019-03-12 VITALS — BP 118/74 | HR 86 | Temp 97.0°F | Ht 63.0 in | Wt 166.2 lb

## 2019-03-12 DIAGNOSIS — R7309 Other abnormal glucose: Secondary | ICD-10-CM

## 2019-03-12 DIAGNOSIS — Z0000 Encounter for general adult medical examination without abnormal findings: Secondary | ICD-10-CM | POA: Diagnosis not present

## 2019-03-12 DIAGNOSIS — E785 Hyperlipidemia, unspecified: Secondary | ICD-10-CM

## 2019-03-12 DIAGNOSIS — R519 Headache, unspecified: Secondary | ICD-10-CM

## 2019-03-12 DIAGNOSIS — K5904 Chronic idiopathic constipation: Secondary | ICD-10-CM | POA: Diagnosis not present

## 2019-03-12 DIAGNOSIS — Z8249 Family history of ischemic heart disease and other diseases of the circulatory system: Secondary | ICD-10-CM

## 2019-03-12 DIAGNOSIS — R7303 Prediabetes: Secondary | ICD-10-CM | POA: Insufficient documentation

## 2019-03-12 MED ORDER — TRAMADOL HCL 50 MG PO TABS: 50.0000 mg | ORAL_TABLET | Freq: Every day | ORAL | 0 refills | Status: DC | PRN

## 2019-03-12 NOTE — Assessment & Plan Note (Signed)
Pt has seen neurology  Last MRI nl in 2015 No change in her chronic daily headache

## 2019-03-12 NOTE — Assessment & Plan Note (Signed)
Fasting glusose of 118  No family hx  Pt is overweight  disc imp of low glycemic diet and wt loss to prevent DM2  Will get A1C at next lab visit

## 2019-03-12 NOTE — Assessment & Plan Note (Signed)
Stable LDL of 129 with HDL down to 41 (poss hormonal) Enc her to keep up exercise  Disc goals for lipids and reasons to control them Rev last labs with pt Rev low sat fat diet in detail  Consider statin if LDL goes up

## 2019-03-12 NOTE — Assessment & Plan Note (Signed)
Pt takes linzess prn Enc good fluid and fiber intake

## 2019-03-12 NOTE — Patient Instructions (Addendum)
Try to eat more meals with protein (regular small meals)  Try to get most of your carbohydrates from produce (with the exception of white potatoes)  Eat less bread/pasta/rice/snack foods/cereals/sweets and other items from the middle of the grocery store (processed carbs)   Don't forget to get your mammogram in January   If you are interested in the shingles vaccine series (Shingrix), call your insurance or pharmacy to check on coverage and location it must be given.  If affordable - you can schedule it here or at your pharmacy depending on coverage   For cholesterol Avoid red meat/ fried foods/ egg yolks/ fatty breakfast meats/ butter, cheese and high fat dairy/ and shellfish    Look for a cervical support pillow for sleep to help headache in am   Take the tramadol for only the most severe headaches - I changed to dosing and you will notice that at the next refill

## 2019-03-12 NOTE — Assessment & Plan Note (Signed)
Reviewed health habits including diet and exercise and skin cancer prevention Reviewed appropriate screening tests for age  Also reviewed health mt list, fam hx and immunization status , as well as social and family history   See HPI Labs reviewed  UTD gyn care  Mammogram planned for January Discussed shingrix vaccine-she plans to check on coverage

## 2019-03-12 NOTE — Assessment & Plan Note (Signed)
Pt has seen neurology- Dr Loretta Plume Headaches are improved/not as severe Taking propranolol daily  Has tizanidine and imitrex for rescue Also tramadol-pt has been filling 120 pills but not needing that much Disc rebound headache-would like to wean her down on this  Changed directions to 1 pill daily as needed for mod to severe ha and urged to only take if necessary  Also discussed getting a cervical support pillow to help neck issues Enc exercise and good self care 1 caff beverage daily max

## 2019-03-12 NOTE — Progress Notes (Signed)
Subjective:    Patient ID: [name], female    DOB: [birth year], 53 y.o.   MRN: [id number]  HPI Here for health maintenance exam and to review chronic medical problems    Wt Readings from Last 3 Encounters:  03/12/19 166 lb 3.2 oz (75.4 kg)  12/17/18 166 lb (75.3 kg)  03/20/18 156 lb (70.8 kg)   29.44 kg/m   Works at home  Taking care of herself -goes to the gym (masked)- does weights / a little cardio  Diet- working on  Has horrible eating habits- does not eat regularly  She tries to keep fruit most of the time  Likes vegetables  Some smoothies -whey protein  Some meat    BP Readings from Last 3 Encounters:  03/12/19 118/74  12/17/18 118/76  03/13/18 116/68   Pulse Readings from Last 3 Encounters:  03/12/19 86  03/13/18 75  01/24/18 80    Mammogram 1/20  Self breast exam - no lumps    Pap 9/19 -negative  Has had a hysterectomy  Had hot flashes a year ago    Colonoscopy 9/19 with 5 y recall   Td 12/19   Flu vaccine 10/20   Zoster status - never had chicken pox that she knows of   Hyperlipidemia Lab Results  Component Value Date   CHOL 184 03/05/2019   CHOL 200 03/07/2018   CHOL 205 (H) 12/14/2015   Lab Results  Component Value Date   HDL 41.90 03/05/2019   HDL 51.60 03/07/2018   HDL 53.90 12/14/2015   Lab Results  Component Value Date   LDLCALC 129 (H) 03/05/2019   LDLCALC 128 (H) 03/07/2018   LDLCALC 132 (H) 12/14/2015   Lab Results  Component Value Date   TRIG 66.0 03/05/2019   TRIG 98.0 03/07/2018   TRIG 96.0 12/14/2015   Lab Results  Component Value Date   CHOLHDL 4 03/05/2019   CHOLHDL 4 03/07/2018   CHOLHDL 4 12/14/2015   No results found for: LDLDIRECT  No fried foods  She does eat steak - on a regular basis    Glucose 118 this last check =she was fasting  No diabetes in the family   Lab Results  Component Value Date   WBC 7.7 03/05/2019   HGB 12.6 03/05/2019   HCT 38.1 03/05/2019   MCV 79.5 03/05/2019   PLT  314.0 03/05/2019   Lab Results  Component Value Date   TSH 1.87 03/05/2019    Lab Results  Component Value Date   CREATININE 0.87 03/05/2019   BUN 8 03/05/2019   NA 139 03/05/2019   K 3.5 03/05/2019   CL 106 03/05/2019   CO2 25 03/05/2019   Lab Results  Component Value Date   ALT 9 03/05/2019   AST 10 03/05/2019   ALKPHOS 65 03/05/2019   BILITOT 0.6 03/05/2019    Headaches - wakes up with them every day  They are much better than they were also  Propranolol Sumatriptan Tizanidine Cannot sleep on a pillow -told she needs to   She takes tramadol for rescue    Takes 4-5 tramadol per week (less than she was)   Patient Active Problem List   Diagnosis Date Noted  . Elevated glucose 03/12/2019  . Mild hyperlipidemia 03/13/2018  . Early satiety 01/25/2018  . Chronic idiopathic constipation 01/24/2018  . Hernia, abdominal 12/14/2015  . Chronic daily headache 05/28/2015  . Family history of brain aneurysm 01/07/2013  . Eczema 12/21/2012  .  Routine general medical examination at a health care facility 08/08/2012  . GERD 08/20/2008  . RENAL CALCULUS, HX OF 08/20/2008   Past Medical History:  Diagnosis Date  . 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 GERD (gastroesophageal reflux disease)   . Hiatal hernia   . HPV in female    also some abnormal paps in past  . PPD positive    Hx of  . Renal lithiasis 2013  . Sickle cell trait [facility])    Past Surgical History:  Procedure Laterality Date  . APPENDECTOMY     1990's  . BREAST EXCISIONAL BIOPSY Left 2014  . BREAST SURGERY     removal of L breast mass / benign /age 82-20  . CESAREAN SECTION     x3  . CYSTOSCOPY  2008   L RPG, stone extraction ([place])  . ESOPHAGOGASTRODUODENOSCOPY  06/15/2012   Several----normal   . FOOT SURGERY  JULY 2013   RIGHT  . LAPAROSCOPIC VAGINAL HYSTERECTOMY  12/02/2010   menorrhagia, leiomyomata  . LAPAROSCOPY N/A 05/24/2012   Procedure: LAPAROSCOPY OPERATIVE;  Surgeon:  Anastasio Auerbach, MD;  Location: [facility];  Service: Gynecology;  Laterality: N/A;  . PELVIC LAPAROSCOPY     x2 for cysts r/o endometriosis  . SALPINGOOPHORECTOMY Right 05/24/2012   Procedure: SALPINGO OOPHORECTOMY;  Surgeon: Anastasio Auerbach, MD;  Location: [facility];  Service: Gynecology;  Laterality: Right;  . TONSILLECTOMY AND ADENOIDECTOMY     age 57  . TUBAL LIGATION     BTL   Social History   Tobacco Use  . Smoking status: Never Smoker  . Smokeless tobacco: Never Used  Substance Use Topics  . Alcohol use: Yes    Alcohol/week: 0.0 standard drinks    Comment: Rare  . Drug use: No   Family History  Problem Relation Age of Onset  . Anesthesia problems Mother   . Hypertension Mother   . Cancer Mother        Lymphoma  . Aneurysm Father   . Breast cancer Maternal Aunt 60  . Cancer Paternal Aunt        uterine CA  . Lupus Cousin   . Prostate cancer Brother   . Colon cancer Neg Hx   . Esophageal cancer Neg Hx   . Stomach cancer Neg Hx   . Rectal cancer Neg Hx    Allergies  Allergen Reactions  . Banana Itching    Throat itches  . Morphine Itching   Current Outpatient Medications on File Prior to Visit  Medication Sig Dispense Refill  . linaclotide (LINZESS) 145 MCG CAPS capsule Take 1 capsule (145 mcg total) by mouth daily before breakfast. 30 capsule 11  . omeprazole (PRILOSEC) 20 MG capsule Take 1 capsule (20 mg total) every morning by mouth. 30 capsule 1  . propranolol ER (INDERAL LA) 80 MG 24 hr capsule Take 1 capsule (80 mg total) by mouth daily. 30 capsule 5  . SUMAtriptan (IMITREX) 100 MG tablet Take 1 tablet earliest onset of migraine.  May repeat x1 in 2 hours if headache persists or recurs. 10 tablet 5  . tiZANidine (ZANAFLEX) 2 MG tablet Take 1 tablet (2 mg total) by mouth at bedtime. 30 tablet 2  . [DISCONTINUED] albuterol (PROAIR HFA) 108 (90 BASE) MCG/ACT inhaler Inhale 2 puffs into the lungs every 4 (four) hours as needed.       No current  facility-administered medications on file prior to visit.  Review of Systems  Constitutional: Negative for activity change, appetite change, fatigue, fever and unexpected weight change.  HENT: Negative for congestion, ear pain, rhinorrhea, sinus pressure and sore throat.   Eyes: Negative for pain, redness and visual disturbance.  Respiratory: Negative for cough, shortness of breath and wheezing.   Cardiovascular: Negative for chest pain and palpitations.  Gastrointestinal: Negative for abdominal pain, blood in stool, constipation and diarrhea.  Endocrine: Negative for polydipsia and polyuria.  Genitourinary: Negative for dysuria, frequency and urgency.  Musculoskeletal: Negative for arthralgias, back pain and myalgias.  Skin: Negative for pallor and rash.  Allergic/Immunologic: Negative for environmental allergies.  Neurological: Positive for headaches. Negative for dizziness, syncope, facial asymmetry, weakness, light-headedness and numbness.  Hematological: Negative for adenopathy. Does not bruise/bleed easily.  Psychiatric/Behavioral: Negative for decreased concentration and dysphoric mood. The patient is not nervous/anxious.        Objective:   Physical Exam Constitutional:      General: She is not in acute distress.    Appearance: Normal appearance. She is well-developed. She is obese. She is not ill-appearing or diaphoretic.  HENT:     Head: Normocephalic and atraumatic.     Right Ear: Tympanic membrane, ear canal and external ear normal.     Left Ear: Tympanic membrane, ear canal and external ear normal.     Nose: Nose normal. No congestion.     Mouth/Throat:     Mouth: Mucous membranes are moist.     Pharynx: Oropharynx is clear. No posterior oropharyngeal erythema.  Eyes:     General: No scleral icterus.    Extraocular Movements: Extraocular movements intact.     Conjunctiva/sclera: Conjunctivae normal.     Pupils: Pupils are equal, round, and reactive to light.   Neck:     Musculoskeletal: Normal range of motion and neck supple. No neck rigidity or muscular tenderness.     Thyroid: No thyromegaly.     Vascular: No carotid bruit or JVD.  Cardiovascular:     Rate and Rhythm: Normal rate and regular rhythm.     Pulses: Normal pulses.     Heart sounds: Normal heart sounds. No gallop.   Pulmonary:     Effort: Pulmonary effort is normal. No respiratory distress.     Breath sounds: Normal breath sounds. No wheezing.     Comments: Good air exch Chest:     Chest wall: No tenderness.  Abdominal:     General: Bowel sounds are normal. There is no distension or abdominal bruit.     Palpations: Abdomen is soft. There is no mass.     Tenderness: There is no abdominal tenderness.     Hernia: No hernia is present.  Genitourinary:    Comments: Breast exam: No mass, nodules, thickening, tenderness, bulging, retraction, inflamation, nipple discharge or skin changes noted.  No axillary or clavicular LA.     Musculoskeletal: Normal range of motion.        General: No tenderness.     Right lower leg: No edema.     Left lower leg: No edema.  Lymphadenopathy:     Cervical: No cervical adenopathy.  Skin:    General: Skin is warm and dry.     Coloration: Skin is not pale.     Findings: No erythema or rash.     Comments: Few skin tags and areas of hyperpigmentation on trunk  Neurological:     Mental Status: She is alert. Mental status is at baseline.  Cranial Nerves: No cranial nerve deficit.     Motor: No abnormal muscle tone.     Coordination: Coordination normal.     Gait: Gait normal.     Deep Tendon Reflexes: Reflexes are normal and symmetric. Reflexes normal.  Psychiatric:        Mood and Affect: Mood normal.        Cognition and Memory: Cognition and memory normal.     Comments: Pleasant  Good mood            Assessment & Plan:   Problem List Items Addressed This Visit      Cardiovascular and Mediastinum   Family history of brain  aneurysm    Pt has seen neurology  Last MRI nl in 2015 No change in her chronic daily headache         Digestive   Chronic idiopathic constipation    Pt takes linzess prn Enc good fluid and fiber intake        Other   Routine general medical examination at a health care facility - Primary    Reviewed health habits including diet and exercise and skin cancer prevention Reviewed appropriate screening tests for age  Also reviewed health mt list, fam hx and immunization status , as well as social and family history   See HPI Labs reviewed  UTD gyn care  Mammogram planned for January Discussed shingrix vaccine-she plans to check on coverage       Chronic daily headache    Pt has seen neurology- Dr Loretta Plume Headaches are improved/not as severe Taking propranolol daily  Has tizanidine and imitrex for rescue Also tramadol-pt has been filling 120 pills but not needing that much Disc rebound headache-would like to wean her down on this  Changed directions to 1 pill daily as needed for mod to severe ha and urged to only take if necessary  Also discussed getting a cervical support pillow to help neck issues Enc exercise and good self care 1 caff beverage daily max       Relevant Medications   traMADol (ULTRAM) 50 MG tablet   Mild hyperlipidemia    Stable LDL of 129 with HDL down to 41 (poss hormonal) Enc her to keep up exercise  Disc goals for lipids and reasons to control them Rev last labs with pt Rev low sat fat diet in detail  Consider statin if LDL goes up      Elevated glucose    Fasting glusose of 118  No family hx  Pt is overweight  disc imp of low glycemic diet and wt loss to prevent DM2  Will get A1C at next lab visit

## 2019-04-02 IMAGING — MG DIGITAL SCREENING BILATERAL MAMMOGRAM WITH TOMO AND CAD
8 series · 9 of 24 positions shown · non-contrast
Comparison: Previous exam(s).

CLINICAL DATA: Screening.

EXAM:
DIGITAL SCREENING BILATERAL MAMMOGRAM WITH TOMO AND CAD

[R MLO synth-2D]
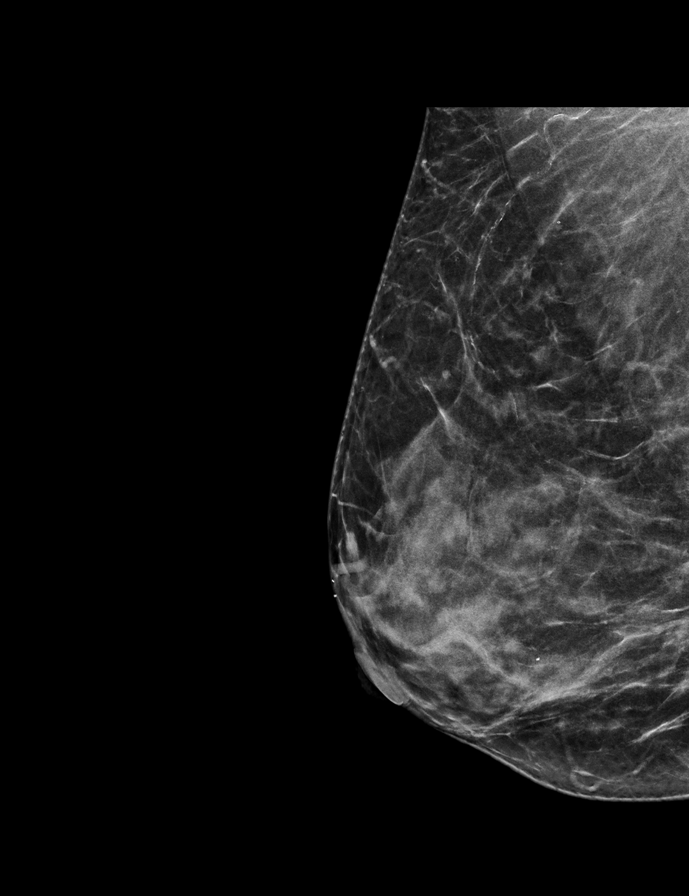

[R CC synth-2D]
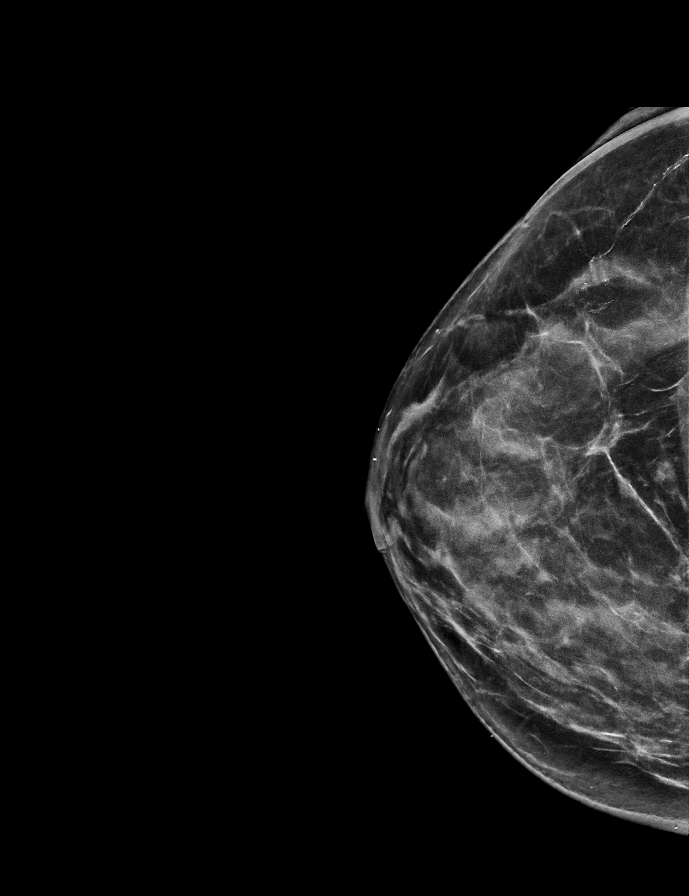

[L MLO synth-2D]
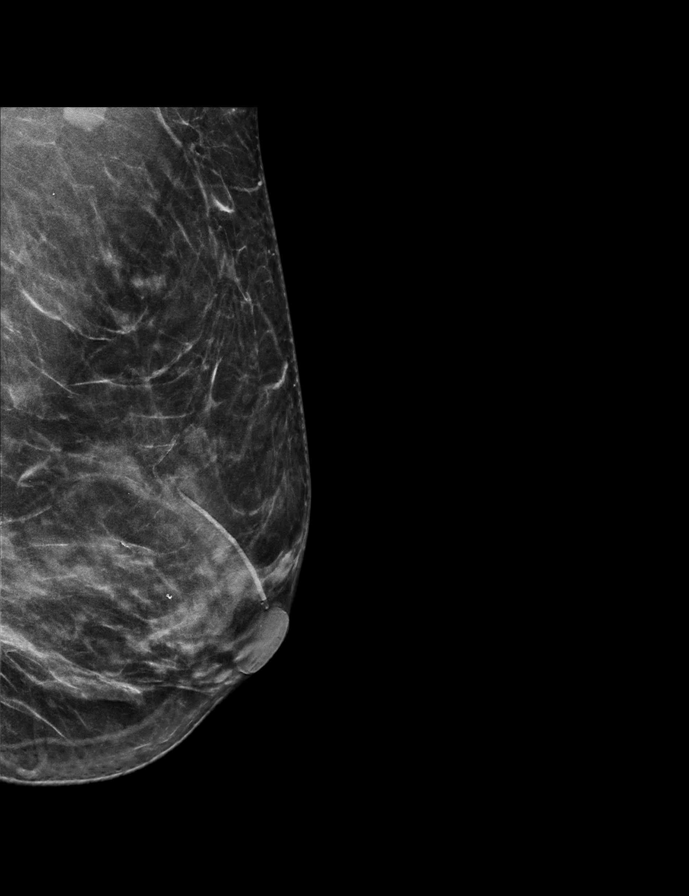

[L CC synth-2D]
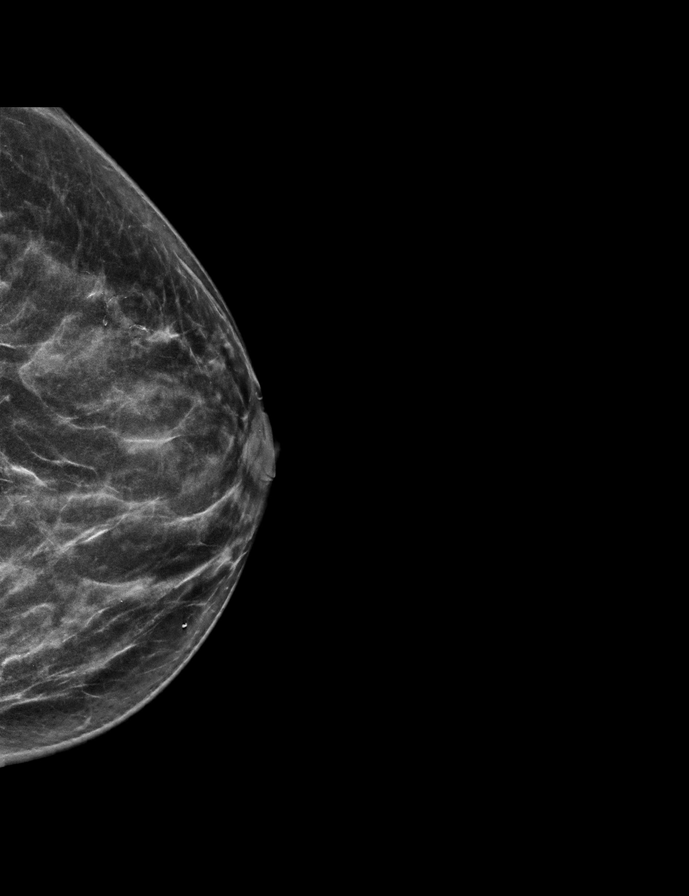

[L MLO tomo · 2 of 56 frames shown]
[frame 19/56]
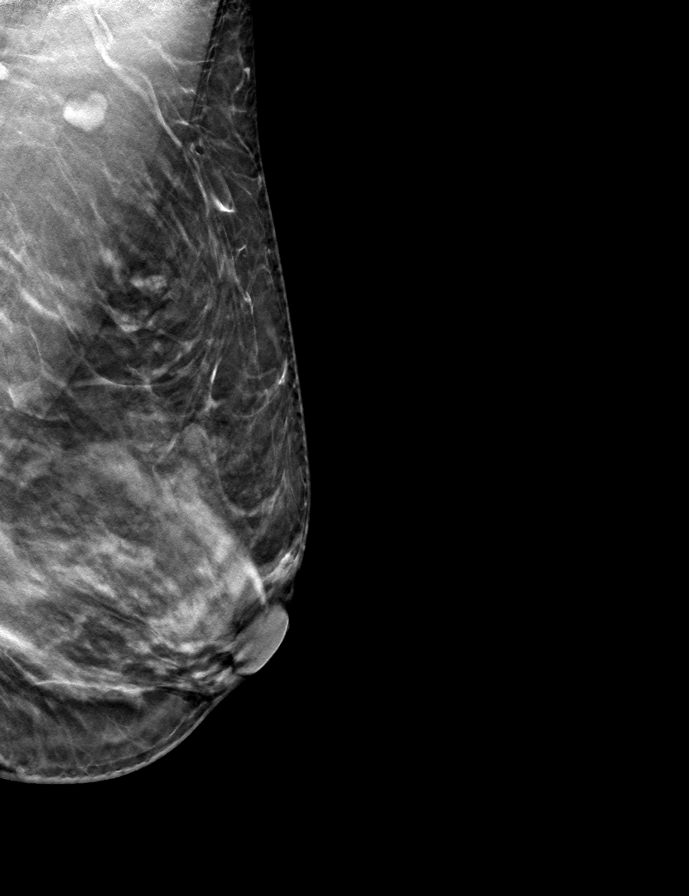
[frame 29/56]
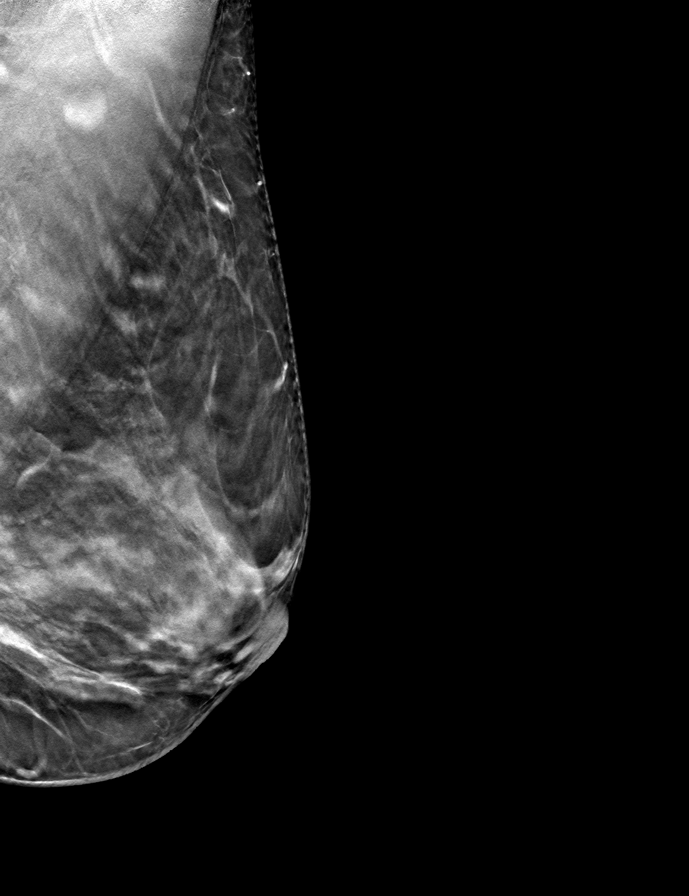

[R CC tomo · tomo slice 30/59.0]
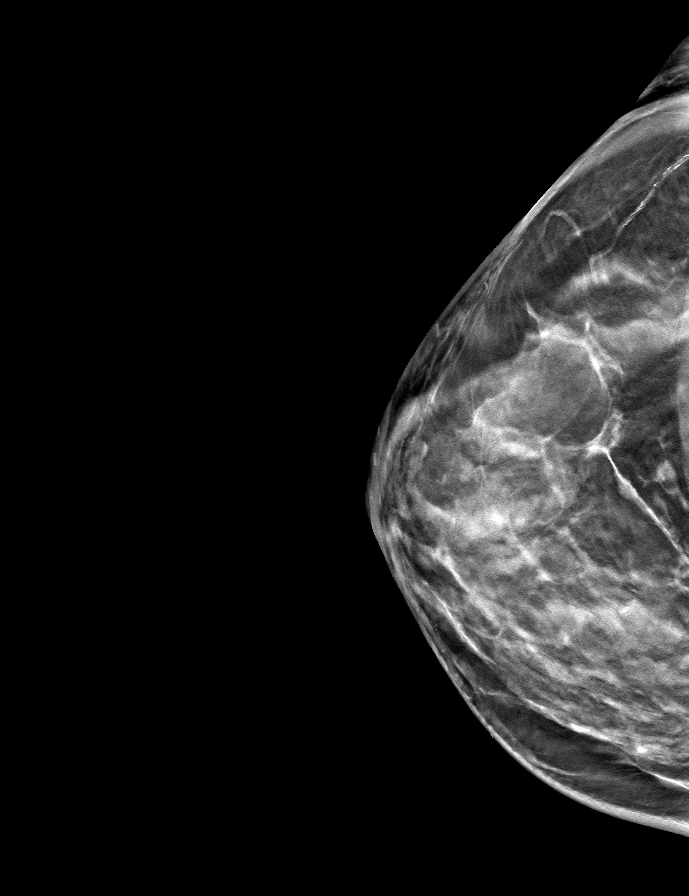

[L CC tomo · tomo slice 29/58.0]
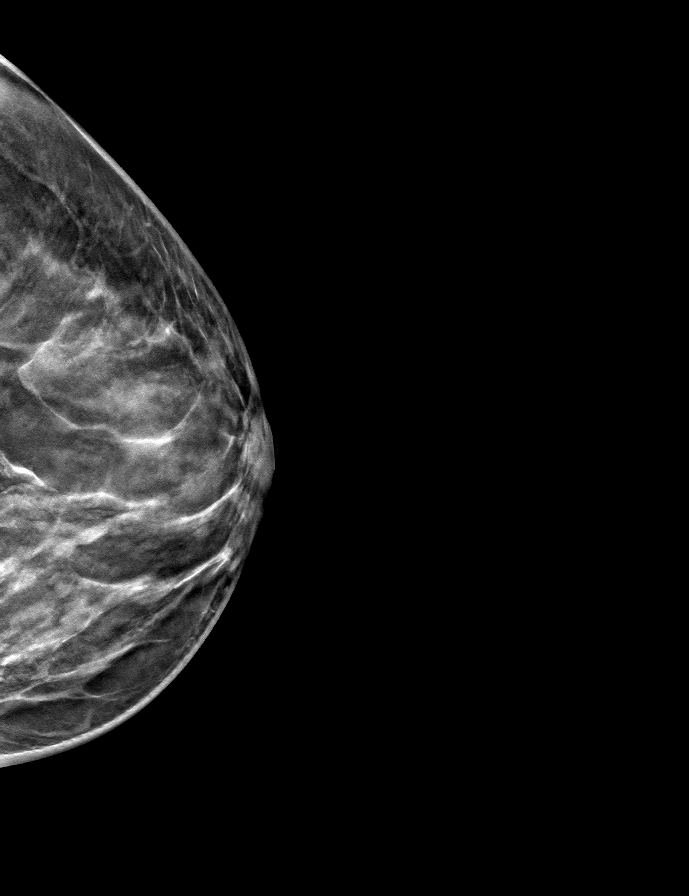

[R MLO tomo · tomo slice 28/55.0]
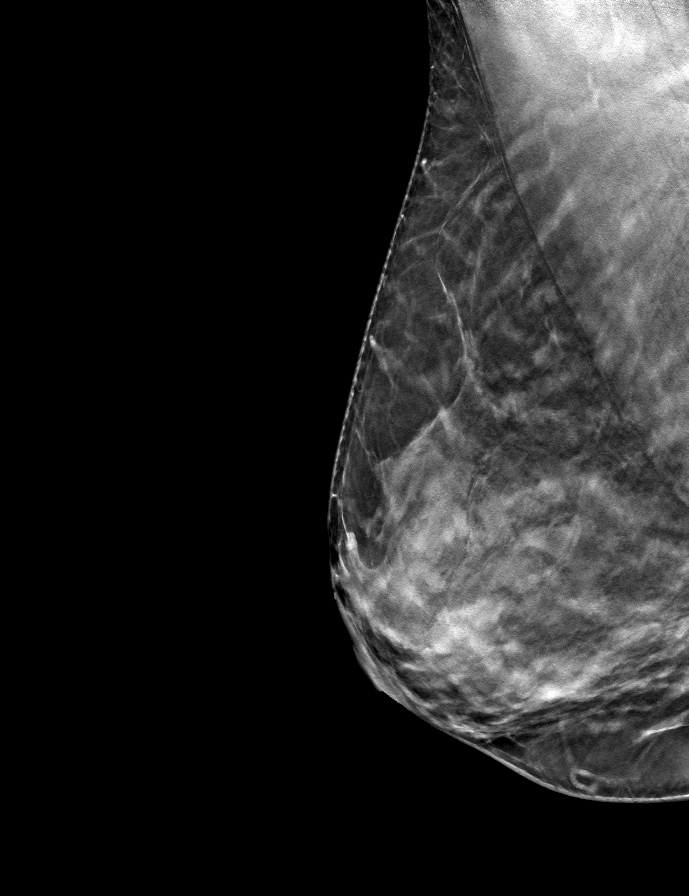

[9 of 24 positions shown; findings below may reference images not displayed]

ACR Breast Density Category c: The breast tissue is heterogeneously
dense, which may obscure small masses.
FINDINGS: There are no findings suspicious for malignancy. Images were
processed with CAD.
IMPRESSION: No mammographic evidence of malignancy. A result letter of this
screening mammogram will be mailed directly to the patient.

RECOMMENDATION:
Screening mammogram in one year. (Code:FT-U-LHB)

BI-RADS CATEGORY  1: Negative.

## 2019-04-08 ENCOUNTER — Other Ambulatory Visit: Payer: Self-pay | Admitting: Family Medicine

## 2019-04-08 ENCOUNTER — Encounter: Payer: Self-pay | Admitting: Family Medicine

## 2019-04-08 ENCOUNTER — Ambulatory Visit (INDEPENDENT_AMBULATORY_CARE_PROVIDER_SITE_OTHER): Payer: 59 | Admitting: Family Medicine

## 2019-04-08 VITALS — Temp 97.7°F

## 2019-04-08 DIAGNOSIS — U071 COVID-19: Secondary | ICD-10-CM

## 2019-04-08 NOTE — Telephone Encounter (Signed)
The last time I saw her- we discussed tramadol use for headaches and she said she was not needing as much I do want to decrease dosing if possible since it is controlled and can be habit forming   Please ask on average how often she is using it and how many pills she has left at this time  Thanks

## 2019-04-08 NOTE — Progress Notes (Signed)
    I connected with [name] on 04/08/19 at  2:00 PM EST by video and verified that I am speaking with the correct person using two identifiers.   I discussed the limitations, risks, security and privacy concerns of performing an evaluation and management service by video and the availability of in person appointments. I also discussed with the patient that there may be a patient responsible charge related to this service. The patient expressed understanding and agreed to proceed.  Patient location: Home Provider Location: [place] [facility] Participants: Lesleigh Noe and Emmit Pomfret   Subjective:     [name] is a 54 y.o. female presenting for COVID positive (test positive on 03/31/2019. Has back pain, loss of smell and taste, headache and was having fever.)     URI  This is a new problem. Associated symptoms include congestion, coughing (mild), headaches and a sore throat.     Still having difficulty catching her breath and fatigue  Breathing symptoms - worse with talking a lot has to catch her breath.   Started with sore throat - initially thought was acid reflux   Review of Systems  Constitutional: Negative for chills and fever (resolve).  HENT: Positive for congestion, postnasal drip and sore throat.   Respiratory: Positive for cough (mild) and shortness of breath.   Musculoskeletal: Positive for back pain.  Neurological: Positive for headaches.     Social History   Tobacco Use  Smoking Status Never Smoker  Smokeless Tobacco Never Used        Objective:   BP Readings from Last 3 Encounters:  03/12/19 118/74  12/17/18 118/76  03/13/18 116/68   Wt Readings from Last 3 Encounters:  03/12/19 166 lb 3.2 oz (75.4 kg)  12/17/18 166 lb (75.3 kg)  03/20/18 156 lb (70.8 kg)    Temp 97.7 F (36.5 C) Comment: per patient  LMP 11/24/2010    Physical Exam Constitutional:      Appearance: Normal appearance. She is not ill-appearing.  HENT:   Head: Normocephalic and atraumatic.     Right Ear: External ear normal.     Left Ear: External ear normal.  Eyes:     Conjunctiva/sclera: Conjunctivae normal.  Pulmonary:     Effort: No respiratory distress.     Comments: After speaking for a few sentences she paused and took several deep breaths. No accessory neck muscle uses. Normal WOB Neurological:     Mental Status: She is alert. Mental status is at baseline.  Psychiatric:        Mood and Affect: Mood normal.        Behavior: Behavior normal.        Thought Content: Thought content normal.        Judgment: Judgment normal.            Assessment & Plan:   Problem List Items Addressed This Visit    None    Visit Diagnoses    COVID-19 virus infection    -  Primary     Discussed time course for infection Symptomatic care - rest, pain relieves ER and return precautions discussed as biggest concern was fatigue and breathing difficulty   Return if symptoms worsen or fail to improve.  Lesleigh Noe, MD

## 2019-04-08 NOTE — Telephone Encounter (Signed)
Name of Medication: Tramadol Name of Pharmacy: [facility] or Written Date and Quantity: 03/08/19 #120 tabs with 0 refills  Last Office Visit and Type: CPE on 03/12/19 and doxy today (Covid) Next Office Visit and Type: none scheduled  :

## 2019-04-09 NOTE — Telephone Encounter (Signed)
Patient contacted the office back. Patient states Tramadol was originally prescribed for her by Dr. Lorelei Pont who see's her for her neck pain. She states she was doing PT for this, but it is too expensive and she can no longer do PT. She states she takes 2 tablets a day, and she only has 1 tablet left.

## 2019-04-09 NOTE — Telephone Encounter (Signed)
Pt notified of Dr. Marliss Coots comments and pt verbalized understanding

## 2019-04-09 NOTE — Telephone Encounter (Signed)
Left VM requesting pt to call the office back 

## 2019-04-09 NOTE — Telephone Encounter (Signed)
Thanks for clarifying  I wrote for 60 pills- this is a month supply since she takes 2 pills daily   This may be habit forming (we discussed that before) so only use if needed

## 2019-04-15 ENCOUNTER — Telehealth: Payer: Self-pay | Admitting: *Deleted

## 2019-04-15 ENCOUNTER — Ambulatory Visit (INDEPENDENT_AMBULATORY_CARE_PROVIDER_SITE_OTHER): Payer: 59 | Admitting: Family Medicine

## 2019-04-15 VITALS — BP 128/84 | HR 83 | Temp 98.7°F | Ht 63.0 in

## 2019-04-15 DIAGNOSIS — R053 Chronic cough: Secondary | ICD-10-CM

## 2019-04-15 DIAGNOSIS — R0602 Shortness of breath: Secondary | ICD-10-CM | POA: Diagnosis not present

## 2019-04-15 DIAGNOSIS — R0789 Other chest pain: Secondary | ICD-10-CM | POA: Diagnosis not present

## 2019-04-15 DIAGNOSIS — U071 COVID-19: Secondary | ICD-10-CM

## 2019-04-15 DIAGNOSIS — R05 Cough: Secondary | ICD-10-CM

## 2019-04-15 MED ORDER — ALBUTEROL SULFATE HFA 108 (90 BASE) MCG/ACT IN AERS: 2.0000 | INHALATION_SPRAY | RESPIRATORY_TRACT | Status: DC | PRN

## 2019-04-15 NOTE — Telephone Encounter (Signed)
Patient advised of everything. Patient states SOB was there to begin with but is worse. Patient is going to respiratory clinic this evening. Appointment made for patient.

## 2019-04-15 NOTE — Patient Instructions (Signed)
For x-ray imaging: [facility]  [place], [place], [place] [address] Enter through [place] and request x-ray department. You will be contacted either via phone or via [place] with your x-ray result. Open 8:00-5:00, tomorrow .     How to Use a Metered Dose Inhaler A metered dose inhaler is a handheld device for taking medicine that must be breathed into the lungs (inhaled). The device can be used to deliver a variety of inhaled medicines, including:  Quick relief or rescue medicines, such as bronchodilators.  Controller medicines, such as corticosteroids. The medicine is delivered by pushing down on a metal canister to release a preset amount of spray and medicine. Each device contains the amount of medicine that is needed for a preset number of uses (inhalations). Your health care provider may recommend that you use a spacer with your inhaler to help you take the medicine more effectively. A spacer is a plastic tube with a mouthpiece on one end and an opening that connects to the inhaler on the other end. A spacer holds the medicine in a tube for a short time, which allows you to inhale more medicine. What are the risks? If you do not use your inhaler correctly, medicine might not reach your lungs to help you breathe. Inhaler medicine can cause side effects, such as:  Mouth or throat infection.  Cough.  Hoarseness.  Headache.  Nausea and vomiting.  Lung infection (pneumonia) in people who have a lung condition called COPD. How to use a metered dose inhaler without a spacer  1. Remove the cap from the inhaler. 2. If you are using the inhaler for the first time, shake it for 5 seconds, turn it away from your face, then release 4 puffs into the air. This is called priming. 3. Shake the inhaler for 5 seconds. 4. Position the inhaler so the top of the canister faces up. 5. Put your index finger on the top of the medicine canister. Support the bottom of the  inhaler with your thumb. 6. Breathe out normally and as completely as possible, away from the inhaler. 7. Either place the inhaler between your teeth and close your lips tightly around the mouthpiece, or hold the inhaler 1-2 inches (2.5-5 cm) away from your open mouth. Keep your tongue down out of the way. If you are unsure which technique to use, ask your health care provider. 8. Press the canister down with your index finger to release the medicine, then inhale deeply and slowly through your mouth (not your nose) until your lungs are completely filled. Inhaling should take 4-6 seconds. 9. Hold the medicine in your lungs for 5-10 seconds (10 seconds is best). This helps the medicine get into the small airways of your lungs. 10. With your lips in a tight circle (pursed), breathe out slowly. 11. Repeat steps 3-10 until you have taken the number of puffs that your health care provider directed. Wait about 1 minute between puffs or as directed. 12. Put the cap on the inhaler. 13. If you are using a steroid inhaler, rinse your mouth with water, gargle, and spit out the water. Do not swallow the water. How to use a metered dose inhaler with a spacer  1. Remove the cap from the inhaler. 2. If you are using the inhaler for the first time, shake it for 5 seconds, turn it away from your face, then release 4 puffs into the air. This is called priming. 3. Shake the inhaler  for 5 seconds. 4. Place the open end of the spacer onto the inhaler mouthpiece. 5. Position the inhaler so the top of the canister faces up and the spacer mouthpiece faces you. 6. Put your index finger on the top of the medicine canister. Support the bottom of the inhaler and the spacer with your thumb. 7. Breathe out normally and as completely as possible, away from the spacer. 8. Place the spacer between your teeth and close your lips tightly around it. Keep your tongue down out of the way. 9. Press the canister down with your index  finger to release the medicine, then inhale deeply and slowly through your mouth (not your nose) until your lungs are completely filled. Inhaling should take 4-6 seconds. 10. Hold the medicine in your lungs for 5-10 seconds (10 seconds is best). This helps the medicine get into the small airways of your lungs. 11. With your lips in a tight circle (pursed), breathe out slowly. 12. Repeat steps 3-11 until you have taken the number of puffs that your health care provider directed. Wait about 1 minute between puffs or as directed. 13. Remove the spacer from the inhaler and put the cap on the inhaler. 14. If you are using a steroid inhaler, rinse your mouth with water, gargle, and spit out the water. Do not swallow the water. Follow these instructions at home:  Take your inhaled medicine only as told by your health care provider. Do not use the inhaler more than directed by your health care provider.  Keep all follow-up visits as told by your health care provider. This is important.  If your inhaler has a counter, you can check it to determine how full your inhaler is. If your inhaler does not have a counter, ask your health care provider when you will need to refill your inhaler and write the refill date on a calendar or on your inhaler canister. Note that you cannot know when an inhaler is empty by shaking it.  Follow directions on the package insert for care and cleaning of your inhaler and spacer. Contact a health care provider if:  Symptoms are only partially relieved with your inhaler.  You are having trouble using your inhaler.  You have an increase in phlegm.  You have headaches. Get help right away if:  You feel little or no relief after using your inhaler.  You have dizziness.  You have a fast heart rate.  You have chills or a fever.  You have night sweats.  There is blood in your phlegm. Summary  A metered dose inhaler is a handheld device for taking medicine that must be  breathed into the lungs (inhaled).  The medicine is delivered by pushing down on a metal canister to release a preset amount of spray and medicine.  Each device contains the amount of medicine that is needed for a preset number of uses (inhalations). This information is not intended to replace advice given to you by your health care provider. Make sure you discuss any questions you have with your health care provider. Document Revised: 03/03/2017 Document Reviewed: 02/09/2016 Elsevier Patient Education  [place] of Breath, Adult Shortness of breath means you have trouble breathing. Shortness of breath could be a sign of a medical problem. Follow these instructions at home:   Watch for any changes in your symptoms.  Do not use any products that contain nicotine or tobacco, such as cigarettes, e-cigarettes, and  chewing tobacco.  Do not smoke. Smoking can cause shortness of breath. If you need help to quit smoking, ask your doctor.  Avoid things that can make it harder to breathe, such as: ? Mold. ? Dust. ? Air pollution. ? Chemical smells. ? Things that can cause allergy symptoms (allergens), if you have allergies.  Keep your living space clean. Use products that help remove mold and dust.  Rest as needed. Slowly return to your normal activities.  Take over-the-counter and prescription medicines only as told by your doctor. This includes oxygen therapy and inhaled medicines.  Keep all follow-up visits as told by your doctor. This is important. Contact a doctor if:  Your condition does not get better as soon as expected.  You have a hard time doing your normal activities, even after you rest.  You have new symptoms. Get help right away if:  Your shortness of breath gets worse.  You have trouble breathing when you are resting.  You feel light-headed or you pass out (faint).  You have a cough that is not helped by medicines.  You cough up  blood.  You have pain with breathing.  You have pain in your chest, arms, shoulders, or belly (abdomen).  You have a fever.  You cannot walk up stairs.  You cannot exercise the way you normally do. These symptoms may represent a serious problem that is an emergency. Do not wait to see if the symptoms will go away. Get medical help right away. Call your local emergency services (911 in the [place] Do not drive yourself to the hospital. Summary  Shortness of breath is when you have trouble breathing enough air. It can be a sign of a medical problem.  Avoid things that make it hard for you to breathe, such as smoking, pollution, mold, and dust.  Watch for any changes in your symptoms. Contact your doctor if you do not get better or you get worse. This information is not intended to replace advice given to you by your health care provider. Make sure you discuss any questions you have with your health care provider. Document Revised: 08/21/2017 Document Reviewed: 08/21/2017 Elsevier Patient Education  [place].

## 2019-04-15 NOTE — Telephone Encounter (Signed)
Patient called stating that she did a visit with Dr. Einar Pheasant last week. Patient stated that she has tested positive twice for covid on 04/30/18 and one day last week. Patent stated that she does not have a fever,still having diarrhea, but that is better. Patient stated that she is having breathing issues, but not bad enough to go to the ER/ Patient stated that she is doing some breathing exercises, but wants to know what else she can do for the SOB?  Patient stated that the SOB comes and goes and can even occur while sitting.  ER precautions given to patient and she verbalized understanding. Pharmacy [facility]Scientist, product/process development

## 2019-04-15 NOTE — Telephone Encounter (Signed)
She could consider purchasing an incentive spirometer (<$10) which can help encourage taking deep breaths.   There have been some studies which have shown that laying on your stomach can improve respiratory status in covid -- I would only do this if her symptoms do not worsen and she notices improvement.   She could also schedule another virtual visit or since her symptoms are worsening may be a candidate for the respiratory clinic to be evaluated in person.

## 2019-04-15 NOTE — Progress Notes (Signed)
Patient ID: [name], female    DOB: [birth year], 53 y.o.   MRN: [id number]  PCP: Abner Greenspan, MD  Chief Complaint  Patient presents with  . covid 19    SOB and Chest tightness     Subjective:  HPI [name] is a 54 y.o. female  presents to [facility] clinic today for further evaluation of COVID-19 related symptoms.   Diagnosed with COVID-19 on with a confirmed positive test  03/31/19, although reports symptoms were significant prior to positive test date. She had a direct exposure from her daughter that was positive following a trip out of the country.  Initial symptoms involved fever, fatigue, shortness of breath, cough, and fatigue.  Current symptoms include dyspnea, cyclic cough, chest tightness, decreased energy level when compared to baseline, right ear pain and occasional nausea.  Patient in normal good state of health is physical active of cardio exercises most days of the week. She complains of SOB with climbing a flight of stairs, walking to the mailbox, and with basic exertional activities. No history of asthma, smoking, or COPD.  Patient has managed symptoms with the following: natural remedies and humidification.  High Risk Complications for [id number] include: history of brain aneurysm   Patient previously admitted for complications of XX123456: N Social History   Socioeconomic History  . Marital status: Married    Spouse name: [name]  . Number of children: 3  . Years of education: College  . Highest education level: Not on file  Occupational History  . Occupation: Therapist, music: [employer]  Tobacco Use  . Smoking status: Never Smoker  . Smokeless tobacco: Never Used  Substance and Sexual Activity  . Alcohol use: Yes    Alcohol/week: 0.0 standard drinks    Comment: Rare  . Drug use: No  . Sexual activity: Yes    Partners: Male    Birth control/protection: Surgical    Comment: 1st intercourse 54 yo-Fewer than 5 partners  Other Topics  Concern  . Not on file  Social History Narrative   Pt lives at home with her spouse.   Caffeine Use: 1-2 cups daily; sodas occasionally   Social Determinants of Health   Financial Resource Strain:   . Difficulty of Paying Living Expenses: Not on file  Food Insecurity:   . Worried About Charity fundraiser in the Last Year: Not on file  . Ran Out of Food in the Last Year: Not on file  Transportation Needs:   . Lack of Transportation (Medical): Not on file  . Lack of Transportation (Non-Medical): Not on file  Physical Activity:   . Days of Exercise per Week: Not on file  . Minutes of Exercise per Session: Not on file  Stress:   . Feeling of Stress : Not on file  Social Connections:   . Frequency of Communication with Friends and Family: Not on file  . Frequency of Social Gatherings with Friends and Family: Not on file  . Attends Religious Services: Not on file  . Active Member of Clubs or Organizations: Not on file  . Attends Archivist Meetings: Not on file  . Marital Status: Not on file  Intimate Partner Violence:   . Fear of Current or Ex-Partner: Not on file  . Emotionally Abused: Not on file  . Physically Abused: Not on file  . Sexually Abused: Not on file    Family History  Problem Relation Age of Onset  .  Anesthesia problems Mother   . Hypertension Mother   . Cancer Mother        Lymphoma  . Aneurysm Father   . Breast cancer Maternal Aunt 16  . Cancer Paternal Aunt        uterine CA  . Lupus Cousin   . Prostate cancer Brother   . Colon cancer Neg Hx   . Esophageal cancer Neg Hx   . Stomach cancer Neg Hx   . Rectal cancer Neg Hx    Review of Systems Pertinent negatives listed in HPI Patient Active Problem List   Diagnosis Date Noted  . Elevated glucose 03/12/2019  . Mild hyperlipidemia 03/13/2018  . Early satiety 01/25/2018  . Chronic idiopathic constipation 01/24/2018  . Hernia, abdominal 12/14/2015  . Chronic daily headache 05/28/2015  .  Family history of brain aneurysm 01/07/2013  . Eczema 12/21/2012  . Routine general medical examination at a health care facility 08/08/2012  . GERD 08/20/2008  . RENAL CALCULUS, HX OF 08/20/2008    Allergies  Allergen Reactions  . Banana Itching    Throat itches  . Morphine Itching    Prior to Admission medications   Medication Sig Start Date End Date Taking? Authorizing Provider  linaclotide Rolan Lipa) 145 MCG CAPS capsule Take 1 capsule (145 mcg total) by mouth daily before breakfast. 11/21/17   Ladene Artist, MD  omeprazole (PRILOSEC) 20 MG capsule Take 1 capsule (20 mg total) every morning by mouth. 02/15/17   Ladene Artist, MD  propranolol ER (INDERAL LA) 80 MG 24 hr capsule Take 1 capsule (80 mg total) by mouth daily. 01/05/17   Pieter Partridge, DO  SUMAtriptan (IMITREX) 100 MG tablet Take 1 tablet earliest onset of migraine.  May repeat x1 in 2 hours if headache persists or recurs. 01/05/17   Pieter Partridge, DO  tiZANidine (ZANAFLEX) 2 MG tablet Take 1 tablet (2 mg total) by mouth at bedtime. 07/10/17   Pieter Partridge, DO  traMADol (ULTRAM) 50 MG tablet TAKE 1 TABLET(50 MG) BY MOUTH EVERY 6 HOURS AS NEEDED 04/09/19   Tower, Wynelle Fanny, MD    Past Medical, Surgical Family and Social History reviewed and updated.    Objective:   Today's Vitals   04/15/19 1916  BP: 128/84  Pulse: 83  Temp: 98.7 F (37.1 C)  SpO2: 99%  Height: 5\' 3"  (1.6 m)    Wt Readings from Last 3 Encounters:  03/12/19 166 lb 3.2 oz (75.4 kg)  12/17/18 166 lb (75.3 kg)  03/20/18 156 lb (70.8 kg)   Physical Exam Constitutional:      General: She is not in acute distress.    Appearance: Normal appearance. She is not ill-appearing, toxic-appearing or diaphoretic.  Cardiovascular:     Rate and Rhythm: Normal rate and regular rhythm.  Pulmonary:     Effort: No respiratory distress.     Breath sounds: No stridor. No wheezing, rhonchi or rales.     Comments: Lung exam grossly intact Musculoskeletal:      Cervical back: Normal range of motion.  Lymphadenopathy:     Cervical: No cervical adenopathy.  Neurological:     General: No focal deficit present.     Mental Status: She is alert.  Psychiatric:        Mood and Affect: Mood normal.     Assessment & Plan:  1. COVID-19 -Current symptoms are likely post-acute sequale related to diagnosis of COVID-19. Continue to management symptomatically only. Lung exam today  is grossly intact and no concerning findings noted on exam. -CXR ordered given activity intolerance to rule any bronchial changes vs. PNA. -Will trial Albuterol 2 puffs every 4-6 hours as needed for SOB.  2. Shortness of breath - DG Chest 2 View; Future -See #1 plan details    3. Persistent cough for 3 weeks or longer -See plan details #1 -Recommended OTC antitussive medication if cough doesn't improve with albuterol and homeopathic treatments  Meds ordered this encounter  Medications  . albuterol (VENTOLIN HFA) 108 (90 Base) MCG/ACT inhaler    Sig: Inhale 2 puffs into the lungs every 4 (four) hours as needed for wheezing or shortness of breath (cough, shortness of breath or wheezing.).     -The patient was given clear instructions to go to ER or return to medical center if symptoms do not improve, worsen or new problems develop. The patient verbalized understanding.    Molli Barrows, FNP-C [facility], PRN Provider  [facility]. [place], [place] [facility] Phone: [phone] Clinic Fax: [phone] Clinic Hours: 5:30 pm -7:30 pm (Monday, Wednesday, and Friday)

## 2019-04-16 ENCOUNTER — Ambulatory Visit (HOSPITAL_COMMUNITY)
Admission: RE | Admit: 2019-04-16 | Discharge: 2019-04-16 | Disposition: A | Discharge status: Home / Self Care | Payer: 59 | Source: Ambulatory Visit | Attending: Family Medicine | Admitting: Family Medicine

## 2019-04-16 ENCOUNTER — Other Ambulatory Visit: Payer: Self-pay

## 2019-04-16 DIAGNOSIS — R0602 Shortness of breath: Secondary | ICD-10-CM | POA: Insufficient documentation

## 2019-05-08 ENCOUNTER — Other Ambulatory Visit: Payer: Self-pay | Admitting: Family Medicine

## 2019-05-08 MED ORDER — TRAMADOL HCL 50 MG PO TABS: 50.0000 mg | ORAL_TABLET | Freq: Two times a day (BID) | ORAL | 0 refills | Status: DC | PRN

## 2019-05-08 NOTE — Telephone Encounter (Signed)
Name of Medication: Tramadol Name of Pharmacy: [facility] or Written Date and Quantity: 04/08/18 #60 tabs with 0 refills  Last Office Visit and Type: CPE on 03/12/19 and (doxy 04/08/19:Covid) Next Office Visit and Type: none scheduled

## 2019-06-03 ENCOUNTER — Other Ambulatory Visit: Payer: Self-pay | Admitting: Family Medicine

## 2019-06-03 NOTE — Telephone Encounter (Signed)
This is early-send back when due

## 2019-06-03 NOTE — Telephone Encounter (Signed)
Name of [place] Name of [facility] or Written Date and Quantity:05/08/19 #60 tabs with 0 refills Last Office Visit and Type:CPE on 03/12/19 and (doxy 04/08/19:Covid) Next Office Visit and Type:none scheduled

## 2019-06-05 MED ORDER — TRAMADOL HCL 50 MG PO TABS: 50.0000 mg | ORAL_TABLET | Freq: Two times a day (BID) | ORAL | 0 refills | Status: DC | PRN

## 2019-07-02 ENCOUNTER — Other Ambulatory Visit: Payer: Self-pay | Admitting: Family Medicine

## 2019-07-02 DIAGNOSIS — Z1231 Encounter for screening mammogram for malignant neoplasm of breast: Secondary | ICD-10-CM

## 2019-07-03 ENCOUNTER — Other Ambulatory Visit: Payer: Self-pay | Admitting: *Deleted

## 2019-07-03 MED ORDER — TRAMADOL HCL 50 MG PO TABS: 50.0000 mg | ORAL_TABLET | Freq: Two times a day (BID) | ORAL | 0 refills | Status: DC | PRN

## 2019-07-03 NOTE — Telephone Encounter (Signed)
Name of Medication:Tramadol Name of [facility] or Written Date and Quantity:06/05/19 #60tabs with 0 refills Last Office Visit and Type:CPE on 03/12/19 and(doxy1/4/21:Covid) Next Office Visit and Type:none scheduled  FYI med will be due on 07/06/19 but it's a Saturday and we are closed on 07/05/19 for good Friday, will route to PCP for approval

## 2019-07-16 ENCOUNTER — Telehealth: Payer: Self-pay | Admitting: *Deleted

## 2019-07-16 NOTE — Telephone Encounter (Signed)
Patient left called stating that she had covid back In January she thinks. Patient stated that she took the J & J vaccine on June 22, 2019.  Patient stated that when she had covid she had terrible leg pain. Patient stated that the leg pain went away after she got over covid. Patient stated that she started again about 2-3 weeks ago with leg and thigh pain. Patient stated that the leg pain seems to occur when she is in bed asleep at night and it wakes her up. Patient stated yesterday she had some cramps in her right side.  Patient stated that she is not having in side pain or cramps today. Patient stated that she is concerned about her legs and wants to know what she should do.

## 2019-07-16 NOTE — Telephone Encounter (Signed)
Please schedule an in office appt  ?

## 2019-07-16 NOTE — Telephone Encounter (Signed)
Patient scheduled appointment on 07/17/19 at 12:00.

## 2019-07-17 ENCOUNTER — Ambulatory Visit (INDEPENDENT_AMBULATORY_CARE_PROVIDER_SITE_OTHER): Payer: 59 | Admitting: Family Medicine

## 2019-07-17 ENCOUNTER — Encounter: Payer: Self-pay | Admitting: Family Medicine

## 2019-07-17 ENCOUNTER — Ambulatory Visit
Admission: RE | Admit: 2019-07-17 | Discharge: 2019-07-17 | Disposition: A | Discharge status: Home / Self Care | Payer: 59 | Source: Ambulatory Visit | Attending: Family Medicine | Admitting: Family Medicine

## 2019-07-17 ENCOUNTER — Other Ambulatory Visit: Payer: Self-pay

## 2019-07-17 DIAGNOSIS — M79604 Pain in right leg: Secondary | ICD-10-CM | POA: Insufficient documentation

## 2019-07-17 NOTE — Assessment & Plan Note (Signed)
Pain in R anterior/superior thigh in pt who received the J and J covid vaccine on 3/26  Will send for venous doppler to r/o DVT for that reason but exam is not consistent with DVT Given the area and exercise frequency -suspect hip issue (though this started up when inactive with covid in dec) Pt takes tramadol prn Recommend elevation If venous doppler is neg would consider xray of hip

## 2019-07-17 NOTE — Progress Notes (Signed)
Subjective:    Patient ID: [name], female    DOB: 10-18-1965, 54 y.o.   MRN: [id number]  This visit occurred during the SARS-CoV-2 public health emergency.  Safety protocols were in place, including screening questions prior to the visit, additional usage of staff PPE, and extensive cleaning of exam room while observing appropriate contact time as indicated for disinfecting solutions.    HPI Pt c/o of R leg pain    She had covid in December  Had a lot of back pain with that  Then pain started radiating to the R leg and her leg felt restless  It improved some   Is better during the day  Worse at night   Had the J and J vaccine on 3/26  Leg hurts- worse at night  Wakes her up  Upper leg and it radiates down   The pain is achy in nature  Has to flex hip and knee to get relief at night  No swelling in her leg now (possibly had some in ankle weeks back) No redness or heat  No lumps  Has tender area in anterior upper thigh/ just below the pelvis   Yesterday had a cramp in her side (R side) as well   Back no longer hurts   She does work out a lot   Patient Active Problem List   Diagnosis Date Noted  . Right leg pain 07/17/2019  . Elevated glucose 03/12/2019  . Mild hyperlipidemia 03/13/2018  . Early satiety 01/25/2018  . Chronic idiopathic constipation 01/24/2018  . Hernia, abdominal 12/14/2015  . Chronic daily headache 05/28/2015  . Family history of brain aneurysm 01/07/2013  . Eczema 12/21/2012  . Routine general medical examination at a health care facility 08/08/2012  . GERD 08/20/2008  . RENAL CALCULUS, HX OF 08/20/2008   Past Medical History:  Diagnosis Date  . 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 GERD (gastroesophageal reflux disease)   . Hiatal hernia   . HPV in female    also some abnormal paps in past  . PPD positive    Hx of  . Renal lithiasis 2013  . Sickle cell trait [facility])    Past Surgical History:  Procedure  Laterality Date  . APPENDECTOMY     1990's  . BREAST EXCISIONAL BIOPSY Left 2014  . BREAST SURGERY     removal of L breast mass / benign /age 11-20  . CESAREAN SECTION     x3  . CYSTOSCOPY  2008   L RPG, stone extraction ([place])  . ESOPHAGOGASTRODUODENOSCOPY  06/15/2012   Several----normal   . FOOT SURGERY  JULY 2013   RIGHT  . LAPAROSCOPIC VAGINAL HYSTERECTOMY  12/02/2010   menorrhagia, leiomyomata  . LAPAROSCOPY N/A 05/24/2012   Procedure: LAPAROSCOPY OPERATIVE;  Surgeon: Anastasio Auerbach, MD;  Location: [facility];  Service: Gynecology;  Laterality: N/A;  . PELVIC LAPAROSCOPY     x2 for cysts r/o endometriosis  . SALPINGOOPHORECTOMY Right 05/24/2012   Procedure: SALPINGO OOPHORECTOMY;  Surgeon: Anastasio Auerbach, MD;  Location: [facility];  Service: Gynecology;  Laterality: Right;  . TONSILLECTOMY AND ADENOIDECTOMY     age 11  . TUBAL LIGATION     BTL   Social History   Tobacco Use  . Smoking status: Never Smoker  . Smokeless tobacco: Never Used  Substance Use Topics  . Alcohol use: Yes    Alcohol/week: 0.0 standard drinks    Comment: Rare  .  Drug use: No   Family History  Problem Relation Age of Onset  . Anesthesia problems Mother   . Hypertension Mother   . Cancer Mother        Lymphoma  . Aneurysm Father   . Breast cancer Maternal Aunt 63  . Cancer Paternal Aunt        uterine CA  . Lupus Cousin   . Prostate cancer Brother   . Colon cancer Neg Hx   . Esophageal cancer Neg Hx   . Stomach cancer Neg Hx   . Rectal cancer Neg Hx    Allergies  Allergen Reactions  . Banana Itching    Throat itches  . Morphine Itching   Current Outpatient Medications on File Prior to Visit  Medication Sig Dispense Refill  . linaclotide (LINZESS) 145 MCG CAPS capsule Take 1 capsule (145 mcg total) by mouth daily before breakfast. (Patient taking differently: Take 145 mcg by mouth daily as needed. ) 30 capsule 11  . SUMAtriptan (IMITREX) 100 MG tablet Take 1 tablet earliest onset  of migraine.  May repeat x1 in 2 hours if headache persists or recurs. 10 tablet 5  . tiZANidine (ZANAFLEX) 2 MG tablet Take 1 tablet (2 mg total) by mouth at bedtime. 30 tablet 2  . traMADol (ULTRAM) 50 MG tablet Take 1 tablet (50 mg total) by mouth 2 (two) times daily as needed for severe pain. 60 tablet 0   No current facility-administered medications on file prior to visit.    Review of Systems  Constitutional: Negative for activity change, appetite change, fatigue, fever and unexpected weight change.  HENT: Negative for congestion, ear pain, rhinorrhea, sinus pressure and sore throat.   Eyes: Negative for pain, redness and visual disturbance.  Respiratory: Negative for cough, shortness of breath and wheezing.   Cardiovascular: Negative for chest pain and palpitations.  Gastrointestinal: Negative for abdominal pain, blood in stool, constipation and diarrhea.  Endocrine: Negative for polydipsia and polyuria.  Genitourinary: Negative for dysuria, frequency and urgency.  Musculoskeletal: Negative for arthralgias, back pain and myalgias.       R upper leg pain /hip pain  Skin: Negative for pallor and rash.  Allergic/Immunologic: Negative for environmental allergies.  Neurological: Positive for headaches. Negative for dizziness, syncope, light-headedness and numbness.  Hematological: Negative for adenopathy. Does not bruise/bleed easily.  Psychiatric/Behavioral: Negative for decreased concentration and dysphoric mood. The patient is not nervous/anxious.        Objective:   Physical Exam Constitutional:      General: She is not in acute distress.    Appearance: Normal appearance. She is well-developed and normal weight. She is not ill-appearing.  HENT:     Head: Normocephalic and atraumatic.  Eyes:     General: No scleral icterus.    Conjunctiva/sclera: Conjunctivae normal.     Pupils: Pupils are equal, round, and reactive to light.  Cardiovascular:     Rate and Rhythm: Normal rate  and regular rhythm.  Pulmonary:     Effort: Pulmonary effort is normal.     Breath sounds: Normal breath sounds. No wheezing or rales.  Abdominal:     General: Bowel sounds are normal. There is no distension.     Palpations: Abdomen is soft.     Tenderness: There is no abdominal tenderness.  Musculoskeletal:        General: Tenderness present.     Cervical back: Normal range of motion and neck supple.     Lumbar back: No edema,  spasms, tenderness or bony tenderness. Normal range of motion.     Right hip: Tenderness present. No deformity, bony tenderness or crepitus. Normal range of motion. Normal strength.     Right upper leg: Tenderness present. No swelling, deformity or bony tenderness.     Comments: Mild tenderness of anterior upper R thigh w/o palpable cord or swelling or warmth  No other tenderness Pain is reproduced with hip ext rotation  Neg homan's sign for pain    Lymphadenopathy:     Cervical: No cervical adenopathy.  Skin:    General: Skin is warm and dry.     Coloration: Skin is not pale.     Findings: No erythema or rash.  Neurological:     Mental Status: She is alert.     Cranial Nerves: No cranial nerve deficit.     Sensory: No sensory deficit.     Motor: No atrophy or abnormal muscle tone.     Coordination: Coordination normal.     Deep Tendon Reflexes: Reflexes are normal and symmetric.     Comments: Negative SLR  Psychiatric:        Mood and Affect: Mood normal.           Assessment & Plan:   Problem List Items Addressed This Visit      Other   Right leg pain    Pain in R anterior/superior thigh in pt who received the J and J covid vaccine on 3/26  Will send for venous doppler to r/o DVT for that reason but exam is not consistent with DVT Given the area and exercise frequency -suspect hip issue (though this started up when inactive with covid in dec) Pt takes tramadol prn Recommend elevation If venous doppler is neg would consider xray of hip        Relevant Orders   US Venous Img Lower Unilateral Right (Completed)

## 2019-07-17 NOTE — Patient Instructions (Addendum)
We are going to arrange a venous ultrasound of your leg to rule out a blood clot   Given area of pain - a hip xray will be helpful if that is negative   If symptoms suddenly worsen in the meantime please let us know

## 2019-07-18 ENCOUNTER — Telehealth: Payer: Self-pay | Admitting: *Deleted

## 2019-07-18 DIAGNOSIS — M79604 Pain in right leg: Secondary | ICD-10-CM

## 2019-07-18 DIAGNOSIS — R1031 Right lower quadrant pain: Secondary | ICD-10-CM | POA: Insufficient documentation

## 2019-07-18 NOTE — Telephone Encounter (Signed)
Called pt to advised of [place] of leg and Dr. Marliss Coots comments and no answer and pt's VM Box was full

## 2019-07-18 NOTE — Telephone Encounter (Signed)
-----   Message from [name], [place] sent at 07/18/2019  3:13 PM EDT ----- Pt viewed her results on mychart. She will come back to the office on Monday 07/22/19 to get the xray, please put order in. Pt did ask if she could also get xrays of her back as well as her hip just to make sure we are not overlooking something.

## 2019-07-22 ENCOUNTER — Ambulatory Visit (INDEPENDENT_AMBULATORY_CARE_PROVIDER_SITE_OTHER)
Admission: RE | Admit: 2019-07-22 | Discharge: 2019-07-22 | Disposition: A | Discharge status: Home / Self Care | Payer: 59 | Source: Ambulatory Visit | Attending: Family Medicine | Admitting: Family Medicine

## 2019-07-22 DIAGNOSIS — R1031 Right lower quadrant pain: Secondary | ICD-10-CM

## 2019-07-22 DIAGNOSIS — M79604 Pain in right leg: Secondary | ICD-10-CM | POA: Diagnosis not present

## 2019-07-24 ENCOUNTER — Ambulatory Visit (INDEPENDENT_AMBULATORY_CARE_PROVIDER_SITE_OTHER): Payer: 59 | Admitting: Family Medicine

## 2019-07-24 ENCOUNTER — Other Ambulatory Visit: Payer: Self-pay

## 2019-07-24 ENCOUNTER — Encounter: Payer: Self-pay | Admitting: Family Medicine

## 2019-07-24 VITALS — BP 130/82 | HR 77 | Temp 98.3°F | Ht 63.0 in | Wt 168.8 lb

## 2019-07-24 DIAGNOSIS — M5416 Radiculopathy, lumbar region: Secondary | ICD-10-CM | POA: Diagnosis not present

## 2019-07-24 DIAGNOSIS — M5441 Lumbago with sciatica, right side: Secondary | ICD-10-CM | POA: Diagnosis not present

## 2019-07-24 MED ORDER — MONTELUKAST SODIUM 10 MG PO TABS: 10.0000 mg | ORAL_TABLET | Freq: Every day | ORAL | 3 refills | Status: DC

## 2019-07-24 MED ORDER — TRAMADOL HCL 50 MG PO TABS: 50.0000 mg | ORAL_TABLET | Freq: Two times a day (BID) | ORAL | 0 refills | Status: DC | PRN

## 2019-07-24 MED ORDER — PREDNISONE 20 MG PO TABS: ORAL_TABLET | ORAL | 0 refills | Status: DC

## 2019-07-24 NOTE — Progress Notes (Signed)
Cindy Hilgert T. Eduard Penkala, MD, [place] at [facility] [place], [address]  Phone: [phone]  FAX: [phone]  [name] - 54 y.o. female  MRN [id number]  Date of Birth: January 05, 1966  Date: 07/24/2019  PCP: Abner Greenspan, MD  Referral: Abner Greenspan, MD  Chief Complaint  Patient presents with  . Back Pain  . Leg Pain  . Pelvic Pain    This visit occurred during the SARS-CoV-2 public health emergency.  Safety protocols were in place, including screening questions prior to the visit, additional usage of staff PPE, and extensive cleaning of exam room while observing appropriate contact time as indicated for disinfecting solutions.   Subjective:   [name] is a 54 y.o. very pleasant female patient with Body mass index is 29.89 kg/m. who presents with the following:  She is having pain in the buttocks as well as in the posterior aspect of her pelvis.  She also has pain down the anterior and lateral aspect of her thigh which goes down to the ankle at this point.  She is hurting quite a bit when she is sitting as well as when she is walking.  She also had some pain when she had COVID-19 in December.  At that point her leg did start to hurt.  Currently when she moves it does hurt quite a bit.  It does come and go, and multiple months ago she fell and struck her back.  She does have some tingling and pins-and-needles sensation, but no severe acute pain, no numbness and no weakness   Review of Systems is noted in the HPI, as appropriate   Objective:   BP 130/82   Pulse 77   Temp 98.3 F (36.8 C) (Temporal)   Ht 5\' 3"  (1.6 m)   Wt 168 lb 12 oz (76.5 kg)   LMP 11/24/2010   SpO2 98%   BMI 29.89 kg/m    GEN: No acute distress; alert,appropriate. PULM: Breathing comfortably in no respiratory distress PSYCH: Normally interactive.   HIP EXAM: SIDE: Bilateral ROM: Abduction,  Flexion, Internal and External range of motion: Full Pain with terminal IROM and EROM: No GTB: NT SLR: NEG Knees: No effusion FABER: NT REVERSE FABER: NT, neg Piriformis: NT at direct palpation Str: flexion: 5/5 abduction: 5/5 adduction: 5/5 Strength testing non-tender   Range of motion at  the waist: Flexion: normal Extension: normal Lateral bending: normal Rotation: all normal  No echymosis or edema Rises to examination table with no difficulty Gait: non antalgic  Inspection/Deformity: N Paraspinus Tenderness: L3-S1, right greater than left  B Ankle Dorsiflexion (L5,4): 5/5 B Great Toe Dorsiflexion (L5,4): 5/5 Heel Walk (L5): WNL Toe Walk (S1): WNL Rise/Squat (L4): WNL  SENSORY B Medial Foot (L4): WNL B Dorsum (L5): WNL B Lateral (S1): WNL Light Touch: WNL Pinprick: WNL  REFLEXES Knee (L4): 2+ Ankle (S1): 2+   B SLR, seated: Posterior pain B SLR, supine: Posterior pain B FABER: Posterior pain B Greater Troch: NT B Log Roll: neg B Sciatic Notch: NT  Radiology: DG Lumbar Spine Complete  Result Date: 07/22/2019 CLINICAL DATA:  Low back, pelvic and right leg pain. Possible radiculopathy. EXAM: LUMBAR SPINE - COMPLETE 4+ VIEW COMPARISON:  Abdominopelvic CT 01/14/2018. FINDINGS: There are 5 lumbar type vertebral bodies. The alignment is normal. The disc spaces are preserved. No evidence of acute fracture or pars defect. Mild facet degenerative  changes inferiorly. IMPRESSION: Mild lumbar spondylosis.  No acute osseous findings. Electronically Signed   By: Richardean Sale M.D.   On: 07/22/2019 14:52   US Venous Img Lower Unilateral Right  Result Date: 07/17/2019 CLINICAL DATA:  Pain x3 weeks EXAM: RIGHT LOWER EXTREMITY VENOUS DOPPLER ULTRASOUND TECHNIQUE: Gray-scale sonography with compression, as well as color and duplex ultrasound, were performed to evaluate the deep venous system(s) from the level of the common femoral vein through the popliteal and proximal  calf veins. COMPARISON:  None. FINDINGS: VENOUS Normal compressibility of the common femoral, superficial femoral, and popliteal veins, as well as the visualized calf veins. Visualized portions of profunda femoral vein and great saphenous vein unremarkable. No filling defects to suggest DVT on grayscale or color Doppler imaging. Doppler waveforms show normal direction of venous flow, normal respiratory phasicity and response to augmentation. Limited views of the contralateral common femoral vein are unremarkable. OTHER None. Limitations: none IMPRESSION: No femoropopliteal DVT nor evidence of DVT within the visualized calf veins. If clinical symptoms are inconsistent or if there are persistent or worsening symptoms, further imaging (possibly involving the iliac veins) may be warranted. Electronically Signed   By: Lucrezia Europe M.D.   On: 07/17/2019 16:58   DG Hip Unilat W OR W/O Pelvis 2-3 Views Right  Result Date: 07/22/2019 CLINICAL DATA:  Right groin pain radiating to the right leg EXAM: DG HIP (WITH OR WITHOUT PELVIS) 2-3V RIGHT COMPARISON:  None. FINDINGS: There is slight cortical irregularity along the right superior femoral head-neck junction likely artifactual with a tiny marginal osteophyte. There is no other fracture or dislocation. There is no evidence of arthropathy or other focal bone abnormality. IMPRESSION: Slight cortical irregularity along the right superior femoral head-neck junction likely artifactual with a tiny marginal osteophyte. Correlate with point tenderness. Otherwise no acute osseous injury of the right hip. Electronically Signed   By: Kathreen Devoid   On: 07/22/2019 14:55    Assessment and Plan:     ICD-10-CM   1. Lumbar radiculopathy, acute  M54.16   2. Acute right-sided low back pain with right-sided sciatica  M54.41    Classic lumbar radiculopathy.  Anatomy explained.  10 days of prednisone.  Refill tramadol, reasonable.  At this point she is hesitant with any kind of muscle  relaxer, as they seem to make her drowsy for extended period of time.  Neurovascularly intact throughout.  Follow-up: Return in about 1 month (around 08/23/2019).  Meds ordered this encounter  Medications  . montelukast (SINGULAIR) 10 MG tablet    Sig: Take 1 tablet (10 mg total) by mouth at bedtime.    Dispense:  90 tablet    Refill:  3  . traMADol (ULTRAM) 50 MG tablet    Sig: Take 1 tablet (50 mg total) by mouth 2 (two) times daily as needed for severe pain.    Dispense:  60 tablet    Refill:  0  . predniSONE (DELTASONE) 20 MG tablet    Sig: 2 tabs po daily for 5 days, then 1 tab po daily for 5 days    Dispense:  15 tablet    Refill:  0   Medications Discontinued During This Encounter  Medication Reason  . linaclotide (LINZESS) 145 MCG CAPS capsule Duplicate  . traMADol (ULTRAM) 50 MG tablet Reorder   No orders of the defined types were placed in this encounter.   Signed,  Maud Deed. Aanyah Loa, MD   Outpatient Encounter Medications as of 07/24/2019  Medication  Sig  . linaclotide (LINZESS) 145 MCG CAPS capsule Take 145 mcg by mouth daily as needed.  . SUMAtriptan (IMITREX) 100 MG tablet Take 1 tablet earliest onset of migraine.  May repeat x1 in 2 hours if headache persists or recurs.  Marland Kitchen tiZANidine (ZANAFLEX) 2 MG tablet Take 1 tablet (2 mg total) by mouth at bedtime.  . traMADol (ULTRAM) 50 MG tablet Take 1 tablet (50 mg total) by mouth 2 (two) times daily as needed for severe pain.  . [DISCONTINUED] traMADol (ULTRAM) 50 MG tablet Take 1 tablet (50 mg total) by mouth 2 (two) times daily as needed for severe pain.  . montelukast (SINGULAIR) 10 MG tablet Take 1 tablet (10 mg total) by mouth at bedtime.  . predniSONE (DELTASONE) 20 MG tablet 2 tabs po daily for 5 days, then 1 tab po daily for 5 days  . [DISCONTINUED] linaclotide (LINZESS) 145 MCG CAPS capsule Take 1 capsule (145 mcg total) by mouth daily before breakfast. (Patient taking differently: Take 145 mcg by mouth daily  as needed. )   No facility-administered encounter medications on file as of 07/24/2019.

## 2019-08-05 ENCOUNTER — Other Ambulatory Visit: Payer: Self-pay

## 2019-08-05 ENCOUNTER — Ambulatory Visit
Admission: RE | Admit: 2019-08-05 | Discharge: 2019-08-05 | Disposition: A | Discharge status: Home / Self Care | Payer: 59 | Source: Ambulatory Visit | Attending: Family Medicine | Admitting: Family Medicine

## 2019-08-05 DIAGNOSIS — Z1231 Encounter for screening mammogram for malignant neoplasm of breast: Secondary | ICD-10-CM

## 2019-08-16 ENCOUNTER — Other Ambulatory Visit: Payer: Self-pay | Admitting: Family Medicine

## 2019-08-16 MED ORDER — TRAMADOL HCL 50 MG PO TABS: 50.0000 mg | ORAL_TABLET | Freq: Two times a day (BID) | ORAL | 0 refills | Status: DC | PRN

## 2019-08-16 NOTE — Telephone Encounter (Signed)
Patient requesting refill on Tramadol.- a week early.  Last filled on 07/24/19 #60 with 0 refill. LOV 07/24/19 with Dr Lorelei Pont and next appointment on 08/26/19 with Dr Glori Bickers

## 2019-08-16 NOTE — Telephone Encounter (Signed)
Refilled  She has needed more often due to radiculopathy

## 2019-08-26 ENCOUNTER — Ambulatory Visit: Payer: 59 | Admitting: Family Medicine

## 2019-09-12 ENCOUNTER — Other Ambulatory Visit: Payer: Self-pay | Admitting: Family Medicine

## 2019-09-12 NOTE — Telephone Encounter (Signed)
Name of Medication: Tramadol Name of Pharmacy: [facility] or Written Date and Quantity: 08/16/19 #60 tabs 0 refill Last Office Visit and Type: appt with [facility] 07/24/19 Next Office Visit and Type:appt with [facility] 09/16/19

## 2019-09-13 ENCOUNTER — Ambulatory Visit: Payer: 59 | Admitting: Family Medicine

## 2019-09-13 MED ORDER — TRAMADOL HCL 50 MG PO TABS: 50.0000 mg | ORAL_TABLET | Freq: Two times a day (BID) | ORAL | 0 refills | Status: DC | PRN

## 2019-09-16 ENCOUNTER — Ambulatory Visit: Payer: 59 | Admitting: Family Medicine

## 2019-09-23 ENCOUNTER — Ambulatory Visit: Payer: 59 | Admitting: Family Medicine

## 2019-09-24 ENCOUNTER — Other Ambulatory Visit: Payer: Self-pay

## 2019-09-24 ENCOUNTER — Encounter: Payer: Self-pay | Admitting: Family Medicine

## 2019-09-24 ENCOUNTER — Ambulatory Visit (INDEPENDENT_AMBULATORY_CARE_PROVIDER_SITE_OTHER): Payer: 59 | Admitting: Family Medicine

## 2019-09-24 VITALS — BP 110/60 | HR 75 | Temp 98.3°F | Ht 63.0 in | Wt 164.2 lb

## 2019-09-24 DIAGNOSIS — M5441 Lumbago with sciatica, right side: Secondary | ICD-10-CM

## 2019-09-24 DIAGNOSIS — M5416 Radiculopathy, lumbar region: Secondary | ICD-10-CM | POA: Diagnosis not present

## 2019-09-24 MED ORDER — AMITRIPTYLINE HCL 10 MG PO TABS: 10.0000 mg | ORAL_TABLET | Freq: Every day | ORAL | 1 refills | Status: DC

## 2019-09-24 NOTE — Patient Instructions (Signed)
(  Over the counter Motrin, Advil, or Generic Ibuprofen 200 mg tablets. 3-4 tablets by mouth 3 times a day. This equals a prescription strength dose.)   You can take this with the Tramadol

## 2019-09-24 NOTE — Progress Notes (Signed)
Faraz Ponciano T. Kerryann Allaire, MD, [place] at [facility] [place], [address]  Phone: [phone]  FAX: [phone]  [name] - 54 y.o. female  MRN [id number]  Date of Birth: May 27, 1965  Date: 09/24/2019  PCP: Abner Greenspan, MD  Referral: Abner Greenspan, MD  Chief Complaint  Patient presents with  . Follow-up    Lumbar Radiculopathy    This visit occurred during the SARS-CoV-2 public health emergency.  Safety protocols were in place, including screening questions prior to the visit, additional usage of staff PPE, and extensive cleaning of exam room while observing appropriate contact time as indicated for disinfecting solutions.   Subjective:   [name] is a 54 y.o. very pleasant female patient with Body mass index is 29.1 kg/m. who presents with the following:  She is here in follow-up secondary to lumbar radiculopathy.  Last time I did treat her with some prednisone and other conservative measures.  R leg and is the one leg.  When lying in the bed and it will be worse when she is sleeping.  However, she is improved compared to her last office time. Learned to tolerate. First thinkin the morning it will bother her but not better.  Primarily bothers her at night, but this is not every night.  Usually last only about 10 or 15 minutes.  She is not having any numbness, tingling, but she is having pain in the thigh primarily. No bowel or bladder incontinence  Not every night. In the morning, 10-15 minutes.   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   Not sleeping    08/16/2019 Last OV with Owens Loffler, MD  She is having pain in the buttocks as well as in the posterior aspect of her pelvis.  She also has pain down the anterior and lateral aspect of her thigh which goes down to the ankle at this point.  She is hurting quite a bit when she is sitting as well as when she is walking.   She also had some pain when she had COVID-19 in December.  At that point her leg did start to hurt.  Currently when she moves it does hurt quite a bit.   It does come and go, and multiple months ago she fell and struck her back.   She does have some tingling and pins-and-needles sensation, but no severe acute pain, no numbness and no weakness   Review of Systems is noted in the HPI, as appropriate   Objective:   BP 110/60   Pulse 75   Temp 98.3 F (36.8 C) (Temporal)   Ht 5\' 3"  (1.6 m)   Wt 164 lb 4 oz (74.5 kg)   LMP 11/24/2010   SpO2 98%   BMI 29.10 kg/m    GEN: No acute distress; alert,appropriate. PULM: Breathing comfortably in no respiratory distress PSYCH: Normally interactive.   Range of motion at  the waist: Flexion: normal Extension: normal Lateral bending: normal Rotation: all normal  No echymosis or edema Rises to examination table with no difficulty Gait: non antalgic  Inspection/Deformity: N Paraspinus Tenderness: L3-S1 on the right  B Ankle Dorsiflexion (L5,4): 5/5 B Great Toe Dorsiflexion (L5,4): 5/5 Heel Walk (L5): WNL Toe Walk (S1): WNL Rise/Squat (L4): WNL  SENSORY B Medial Foot (L4): WNL B Dorsum (L5): WNL B Lateral (S1): WNL Light Touch: WNL Pinprick: WNL  REFLEXES Knee (L4): 2+ Ankle (  S1): 2+  B SLR, seated: neg B SLR, supine: neg B FABER: neg B Reverse FABER: neg B Greater Troch: NT B Log Roll: neg B Stork: NT B Sciatic Notch: NT   Radiology: CLINICAL DATA:  Right groin pain radiating to the right leg   EXAM: DG HIP (WITH OR WITHOUT PELVIS) 2-3V RIGHT   COMPARISON:  None.   FINDINGS: There is slight cortical irregularity along the right superior femoral head-neck junction likely artifactual with a tiny marginal osteophyte. There is no other fracture or dislocation. There is no evidence of arthropathy or other focal bone abnormality.   IMPRESSION: Slight cortical irregularity along the right superior  femoral head-neck junction likely artifactual with a tiny marginal osteophyte. Correlate with point tenderness.   Otherwise no acute osseous injury of the right hip.     Electronically Signed   By: Kathreen Devoid   On: 07/22/2019 14:55   CLINICAL DATA:  Low back, pelvic and right leg pain. Possible radiculopathy.   EXAM: LUMBAR SPINE - COMPLETE 4+ VIEW   COMPARISON:  Abdominopelvic CT 01/14/2018.   FINDINGS: There are 5 lumbar type vertebral bodies. The alignment is normal. The disc spaces are preserved. No evidence of acute fracture or pars defect. Mild facet degenerative changes inferiorly.   IMPRESSION: Mild lumbar spondylosis.  No acute osseous findings.     Electronically Signed   By: Richardean Sale M.D.   On: 07/22/2019 14:52   Assessment and Plan:     ICD-10-CM   1. Lumbar radiculopathy, acute  M54.16   2. Acute right-sided low back pain with right-sided sciatica  M54.41    She is improving, but still having some pain primarily in the thigh.  Add tricyclic antidepressant to help with neuropathic pain.  Give this more time to hopefully have some disc resorption  Follow-up: Return in about 2 months (around 11/24/2019).  Meds ordered this encounter  Medications  . amitriptyline (ELAVIL) 10 MG tablet    Sig: Take 1-2 tablets (10-20 mg total) by mouth at bedtime.    Dispense:  60 tablet    Refill:  1   Medications Discontinued During This Encounter  Medication Reason  . predniSONE (DELTASONE) 20 MG tablet Completed Course   No orders of the defined types were placed in this encounter.   Signed,  Maud Deed. Morad Tal, MD   Outpatient Encounter Medications as of 09/24/2019  Medication Sig  . linaclotide (LINZESS) 145 MCG CAPS capsule Take 145 mcg by mouth daily as needed.  . montelukast (SINGULAIR) 10 MG tablet Take 1 tablet (10 mg total) by mouth at bedtime.  . SUMAtriptan (IMITREX) 100 MG tablet Take 1 tablet earliest onset of migraine.  May repeat x1  in 2 hours if headache persists or recurs.  Marland Kitchen tiZANidine (ZANAFLEX) 2 MG tablet Take 1 tablet (2 mg total) by mouth at bedtime.  . traMADol (ULTRAM) 50 MG tablet Take 1 tablet (50 mg total) by mouth 2 (two) times daily as needed for severe pain.  Marland Kitchen amitriptyline (ELAVIL) 10 MG tablet Take 1-2 tablets (10-20 mg total) by mouth at bedtime.  . [DISCONTINUED] predniSONE (DELTASONE) 20 MG tablet 2 tabs po daily for 5 days, then 1 tab po daily for 5 days   No facility-administered encounter medications on file as of 09/24/2019.

## 2019-10-10 ENCOUNTER — Other Ambulatory Visit: Payer: Self-pay | Admitting: Family Medicine

## 2019-10-10 NOTE — Telephone Encounter (Signed)
Name of Medication: Tramadol Name of Pharmacy: [facility] or Written Date and Quantity: 08/16/19 #60 tabs 0 refill Last Office Visit and Type: appt with [facility] 09/24/19 Next Office Visit and Type:appt with [facility] 11/25/19

## 2019-10-11 ENCOUNTER — Other Ambulatory Visit: Payer: Self-pay | Admitting: Family Medicine

## 2019-10-11 MED ORDER — TRAMADOL HCL 50 MG PO TABS: 50.0000 mg | ORAL_TABLET | Freq: Two times a day (BID) | ORAL | 0 refills | Status: DC | PRN

## 2019-11-08 ENCOUNTER — Other Ambulatory Visit: Payer: Self-pay | Admitting: Family Medicine

## 2019-11-08 MED ORDER — TRAMADOL HCL 50 MG PO TABS: 50.0000 mg | ORAL_TABLET | Freq: Two times a day (BID) | ORAL | 0 refills | Status: DC | PRN

## 2019-11-08 NOTE — Telephone Encounter (Signed)
Refill request Last office visit 09/24/19 acute Last refill 10/11/19 #60 Upcoming appointment with Dr. Lorelei Pont 11/25/19 See allergy/contraindication

## 2019-11-24 NOTE — Progress Notes (Signed)
Orestes Geiman T. Alyric Parkin, MD, [place] at [facility] [place], [address]  Phone: [phone]   FAX: [phone]  Cande Mastropietro - 54 y.o. female   MRN [id number]   Date of Birth: 07-12-1965  Date: 11/25/2019   PCP: Abner Greenspan, MD   Referral: Abner Greenspan, MD  Chief Complaint  Patient presents with   Follow-up    Lumbar Radiculopathy    This visit occurred during the SARS-CoV-2 public health emergency.  Safety protocols were in place, including screening questions prior to the visit, additional usage of staff PPE, and extensive cleaning of exam room while observing appropriate contact time as indicated for disinfecting solutions.   Subjective:   [name] is a 54 y.o. very pleasant female patient with Body mass index is 29.98 kg/m. who presents with the following:  She is here to follow-up about her back pain and radicular symptoms.  She has been having some referred pain to the posterior of her pelvis.  This is all in and about the right leg.  At the time I last saw her she had improved compared to the prior office visit.  Her primary issue is intensive pain at nighttime.  At that point she was not having any numbness or tingling or strength changes.  Will happen when she lays down for a while.  Has to use the bathroom.  Some days does not even notice it.    She is able to do her normal activity, she does have some pain in the gym.  It is quite calm compared to other times and I have seen her.  She is stiff in the morning sometimes.  She is no longer having radicular pain, she denies numbness or tingling as well as weakness.  Wt Readings from Last 3 Encounters:  11/25/19 169 lb 4 oz (76.8 kg)  09/24/19 164 lb 4 oz (74.5 kg)  07/24/19 168 lb 12 oz (76.5 kg)     09/24/2019 Last OV with Owens Loffler, MD  She is here in follow-up secondary to lumbar radiculopathy.  Last time I  did treat her with some prednisone and other conservative measures.  R leg and is the one leg.  When lying in the bed and it will be worse when she is sleeping.  However, she is improved compared to her last office time. Learned to tolerate. First thinkin the morning it will bother her but not better.  Primarily bothers her at night, but this is not every night.  Usually last only about 10 or 15 minutes.  She is not having any numbness, tingling, but she is having pain in the thigh primarily. No bowel or bladder incontinence  Not every night. In the morning, 10-15 minutes.   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   Not sleeping    08/16/2019 Last OV with Owens Loffler, MD  She is having pain in the buttocks as well as in the posterior aspect of her pelvis. She also has pain down the anterior and lateral aspect of her thigh which goes down to the ankle at this point. She is hurting quite a bit when she is sitting as well as when she is walking. She also had some pain when she had COVID-19 in December. At that point her leg did start to hurt. Currently when she moves it does hurt quite a bit.  It does come and  go, and multiple months ago she fell and struck her back.  She does have some tingling and pins-and-needles sensation, but no severe acute pain, no numbness and no weakness  Review of Systems is noted in the HPI, as appropriate   Objective:   BP 110/60    Pulse 80    Temp 98.1 F (36.7 C) (Temporal)    Ht 5\' 3"  (1.6 m)    Wt 169 lb 4 oz (76.8 kg)    LMP 11/24/2010    SpO2 99%    BMI 29.98 kg/m    GEN: No acute distress; alert,appropriate. PULM: Breathing comfortably in no respiratory distress PSYCH: Normally interactive.   Range of motion at  the waist: Flexion: normal Extension: normal Lateral bending: normal Rotation: all normal  No echymosis or edema Rises to examination table with no difficulty Gait: non antalgic  Inspection/Deformity: N Paraspinus  Tenderness: Very mild L3-S1 bilaterally  B Ankle Dorsiflexion (L5,4): 5/5 B Great Toe Dorsiflexion (L5,4): 5/5 Heel Walk (L5): WNL Toe Walk (S1): WNL Rise/Squat (L4): WNL  SENSORY B Medial Foot (L4): WNL B Dorsum (L5): WNL B Lateral (S1): WNL Light Touch: WNL Pinprick: WNL  REFLEXES Knee (L4): 2+ Ankle (S1): 2+  B SLR, seated: neg B SLR, supine: neg B Greater Troch: NT B Log Roll: neg B Sciatic Notch: NT   Radiology: No results found.  Assessment and Plan:     ICD-10-CM   1. Lumbar radiculopathy, acute  M54.16   2. Acute right-sided low back pain with right-sided sciatica  M54.41    Radiculopathy has stopped.  Occasional intermittent back pain that often is not even present daily.  Work on weight loss and continue with fitness.  Follow-up with me as needed only.  Meds ordered this encounter  Medications   linaclotide (LINZESS) 145 MCG CAPS capsule    Sig: Take 1 capsule (145 mcg total) by mouth daily as needed.    Dispense:  90 capsule    Refill:  1   Medications Discontinued During This Encounter  Medication Reason   linaclotide (LINZESS) 145 MCG CAPS capsule Reorder   No orders of the defined types were placed in this encounter.   Signed,  Maud Deed. Kinjal Neitzke, MD   Outpatient Encounter Medications as of 11/25/2019  Medication Sig   amitriptyline (ELAVIL) 10 MG tablet Take 1-2 tablets (10-20 mg total) by mouth at bedtime.   linaclotide (LINZESS) 145 MCG CAPS capsule Take 1 capsule (145 mcg total) by mouth daily as needed.   montelukast (SINGULAIR) 10 MG tablet Take 1 tablet (10 mg total) by mouth at bedtime.   SUMAtriptan (IMITREX) 100 MG tablet Take 1 tablet earliest onset of migraine.  May repeat x1 in 2 hours if headache persists or recurs.   tiZANidine (ZANAFLEX) 2 MG tablet Take 1 tablet (2 mg total) by mouth at bedtime.   traMADol (ULTRAM) 50 MG tablet Take 1 tablet (50 mg total) by mouth 2 (two) times daily as needed for severe pain.    [DISCONTINUED] linaclotide (LINZESS) 145 MCG CAPS capsule Take 145 mcg by mouth daily as needed.   No facility-administered encounter medications on file as of 11/25/2019.

## 2019-11-25 ENCOUNTER — Encounter: Payer: Self-pay | Admitting: Family Medicine

## 2019-11-25 ENCOUNTER — Ambulatory Visit (INDEPENDENT_AMBULATORY_CARE_PROVIDER_SITE_OTHER): Payer: 59 | Admitting: Family Medicine

## 2019-11-25 ENCOUNTER — Other Ambulatory Visit: Payer: Self-pay

## 2019-11-25 VITALS — BP 110/60 | HR 80 | Temp 98.1°F | Ht 63.0 in | Wt 169.2 lb

## 2019-11-25 DIAGNOSIS — M5416 Radiculopathy, lumbar region: Secondary | ICD-10-CM

## 2019-11-25 DIAGNOSIS — M5441 Lumbago with sciatica, right side: Secondary | ICD-10-CM | POA: Diagnosis not present

## 2019-11-25 MED ORDER — LINACLOTIDE 145 MCG PO CAPS: 145.0000 ug | ORAL_CAPSULE | Freq: Every day | ORAL | 1 refills | Status: DC | PRN

## 2019-11-25 NOTE — Patient Instructions (Signed)
Intermittent fasting  Most common -  Fast then eat during an 8 hour window (or a 6 hour window)

## 2019-12-09 ENCOUNTER — Other Ambulatory Visit: Payer: Self-pay | Admitting: Family Medicine

## 2019-12-10 MED ORDER — TRAMADOL HCL 50 MG PO TABS: 50.0000 mg | ORAL_TABLET | Freq: Two times a day (BID) | ORAL | 0 refills | Status: DC | PRN

## 2019-12-12 ENCOUNTER — Telehealth: Payer: Self-pay | Admitting: *Deleted

## 2019-12-12 NOTE — Telephone Encounter (Signed)
PA for Tramadol done at [email or web address] I will await a response  

## 2019-12-16 ENCOUNTER — Other Ambulatory Visit: Payer: Self-pay | Admitting: Family Medicine

## 2019-12-16 NOTE — Telephone Encounter (Signed)
Last office visit 11/25/2019 for lumbar radiculopathy with Dr. Lorelei Pont.  Last refilled 09/24/2019 for #60 with 1 refill.  No future appointments with PCP.

## 2019-12-19 ENCOUNTER — Encounter: Payer: 59 | Admitting: Obstetrics and Gynecology

## 2019-12-19 DIAGNOSIS — Z0289 Encounter for other administrative examinations: Secondary | ICD-10-CM

## 2020-01-07 ENCOUNTER — Other Ambulatory Visit: Payer: Self-pay | Admitting: Family Medicine

## 2020-01-07 MED ORDER — TRAMADOL HCL 50 MG PO TABS: 50.0000 mg | ORAL_TABLET | Freq: Two times a day (BID) | ORAL | 0 refills | Status: DC | PRN

## 2020-01-07 NOTE — Telephone Encounter (Signed)
Name of Medication: Tramadol Name of Pharmacy: [facility] or Written Date and Quantity: 12/10/19 #60 tabs with 0 refills Last Office Visit and Type: Dr. Lorelei Pont on 11/25/19, R leg pain on 07/17/19 (PCP) Next Office Visit and Type: none sheduled

## 2020-02-03 ENCOUNTER — Encounter: Payer: 59 | Admitting: Obstetrics and Gynecology

## 2020-02-06 ENCOUNTER — Other Ambulatory Visit: Payer: Self-pay | Admitting: Family Medicine

## 2020-02-06 MED ORDER — TRAMADOL HCL 50 MG PO TABS: 50.0000 mg | ORAL_TABLET | Freq: Two times a day (BID) | ORAL | 0 refills | Status: DC | PRN

## 2020-02-06 NOTE — Telephone Encounter (Signed)
Refill request for pending Rx last OV 4/14/21last refill 01/07/20, patient seen Dr. Frederico Hamman on 11/25/19. Please advise

## 2020-02-24 ENCOUNTER — Ambulatory Visit: Payer: 59 | Attending: Internal Medicine

## 2020-02-24 DIAGNOSIS — Z23 Encounter for immunization: Secondary | ICD-10-CM

## 2020-02-24 NOTE — Progress Notes (Signed)
   Covid-19 Vaccination Clinic  Name:  [name]    MRN: [id number] DOB: [birth year]  02/24/2020  [name] was observed post Covid-19 immunization for 15 minutes without incident. She was provided with Vaccine Information Sheet and instruction to access the V-Safe system.   [name] was instructed to call 911 with any severe reactions post vaccine: Marland Kitchen Difficulty breathing  . Swelling of face and throat  . A fast heartbeat  . A bad rash all over body  . Dizziness and weakness   Immunizations Administered    Name Date Dose VIS Date Route   Pfizer COVID-19 Vaccine 02/24/2020  2:40 PM 0.3 mL 01/22/2020 Intramuscular   Manufacturer: [place]   Lot: [id number]   [place]: [id number]

## 2020-03-06 ENCOUNTER — Other Ambulatory Visit: Payer: Self-pay | Admitting: Family Medicine

## 2020-03-06 MED ORDER — TRAMADOL HCL 50 MG PO TABS: 50.0000 mg | ORAL_TABLET | Freq: Two times a day (BID) | ORAL | 0 refills | Status: DC | PRN

## 2020-03-06 NOTE — Telephone Encounter (Signed)
Name of Medication: Tramadol Name of Pharmacy: [place] or Written Date and Quantity: 02/06/20 #60 tabs with 0 refills Last Office Visit and Type: 11/25/19 appt with Dr. Lorelei Pont Next Office Visit and Type: none scheduled

## 2020-03-20 ENCOUNTER — Telehealth: Payer: Self-pay

## 2020-03-20 NOTE — Telephone Encounter (Signed)
Aware, will watch for correspondence  

## 2020-03-20 NOTE — Telephone Encounter (Signed)
Pt left v/m that she thinks she has a kidney stone; pt in a lot of pain. I spoke with pt; no available appts at [facility]. Pt said started on 03/19/20 and has lower rt back pain that goes around to rt lower abd. Pt voiding frequently and pt does not feel like she finishes when she stops urinating. Pt does not want to go to ED and will go to [facility] on [address] now. FYI to Dr Glori Bickers.

## 2020-03-30 ENCOUNTER — Encounter: Payer: 59 | Admitting: Obstetrics and Gynecology

## 2020-04-05 ENCOUNTER — Other Ambulatory Visit: Payer: Self-pay | Admitting: Family Medicine

## 2020-04-06 ENCOUNTER — Other Ambulatory Visit: Payer: Self-pay | Admitting: Family Medicine

## 2020-04-07 MED ORDER — TRAMADOL HCL 50 MG PO TABS: 50.0000 mg | ORAL_TABLET | Freq: Two times a day (BID) | ORAL | 0 refills | Status: DC | PRN

## 2020-04-07 NOTE — Telephone Encounter (Signed)
Name of Medication: Tramadol Name of Pharmacy: [facility] [address] Last Fill or Written Date and Quantity: 03/09/20 #60 tabs with 0 refills Last Office Visit and Type: 11/25/19 appt with Dr. Patsy Lager Next Office Visit and Type: none scheduled

## 2020-04-27 ENCOUNTER — Encounter: Payer: 59 | Admitting: Obstetrics and Gynecology

## 2020-05-07 ENCOUNTER — Other Ambulatory Visit: Payer: Self-pay | Admitting: Family Medicine

## 2020-05-07 MED ORDER — TRAMADOL HCL 50 MG PO TABS: 50.0000 mg | ORAL_TABLET | Freq: Two times a day (BID) | ORAL | 0 refills | Status: DC | PRN

## 2020-05-07 NOTE — Telephone Encounter (Signed)
Name of [place] Name of Pharmacy:[facility] [place] or Written Date and Quantity:04/07/20 #60 tabs with 0 refills Last Office Visit and Type:11/25/19 appt with Dr. Lorelei Pont Next Office Visit and Type:none scheduled

## 2020-05-20 ENCOUNTER — Encounter: Payer: Self-pay | Admitting: Obstetrics & Gynecology

## 2020-05-20 ENCOUNTER — Ambulatory Visit (INDEPENDENT_AMBULATORY_CARE_PROVIDER_SITE_OTHER): Payer: 59 | Admitting: Obstetrics & Gynecology

## 2020-05-20 ENCOUNTER — Other Ambulatory Visit: Payer: Self-pay

## 2020-05-20 VITALS — BP 124/80 | Ht 63.0 in | Wt 174.0 lb

## 2020-05-20 DIAGNOSIS — N951 Menopausal and female climacteric states: Secondary | ICD-10-CM

## 2020-05-20 DIAGNOSIS — E6609 Other obesity due to excess calories: Secondary | ICD-10-CM

## 2020-05-20 DIAGNOSIS — Z90710 Acquired absence of both cervix and uterus: Secondary | ICD-10-CM

## 2020-05-20 DIAGNOSIS — Z01419 Encounter for gynecological examination (general) (routine) without abnormal findings: Secondary | ICD-10-CM

## 2020-05-20 DIAGNOSIS — Z6830 Body mass index (BMI) 30.0-30.9, adult: Secondary | ICD-10-CM

## 2020-05-20 DIAGNOSIS — Z1272 Encounter for screening for malignant neoplasm of vagina: Secondary | ICD-10-CM

## 2020-05-20 NOTE — Progress Notes (Signed)
[name] Apr 29, 1965 [id number]   History:    55 y.o. G3P2103 female   RP:  Established patient for annual gynecologic exam.    HPI:  Status post LAVH 2012 with subsequent RSO 2014. Perimenopausal.  No significant menopausal symptoms. Chronic right lower quadrant pain.  Had been evaluated several times in the past.  Most recently with October 2019 CT scan which was normal with exception of mild right renal pelviectasis.  Followed by Urology.  Breasts normal.  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  She saw general surgeon who discussed excision versus observation and they both agreed to follow.  Last Pap with Dr Chancy Hurter on 12/2018.  History of abnormal Pap smears a number of years ago with normal Pap smears since.  Past medical history,surgical history, family history and social history were all reviewed and documented in the EPIC chart.  Gynecologic History Patient's last menstrual period was 11/24/2010.  Obstetric History OB History  Gravida Para Term Preterm AB Living  3 3 2 1   3   SAB IAB Ectopic Multiple Live Births          3    # Outcome Date GA Lbr Len/2nd Weight Sex Delivery Anes PTL Lv  3 Preterm     M CS-Unspec  Y LIV  2 Term     F CS-Unspec  N LIV  1 Term     M CS-Unspec  N LIV     ROS: A ROS was performed and pertinent positives and negatives are included in the history. GENERAL: No fevers or chills. HEENT: No change in vision, no earache, sore throat or sinus congestion. NECK: No pain or stiffness. CARDIOVASCULAR: No chest pain or pressure. No palpitations. PULMONARY: No shortness of breath, cough or wheeze. GASTROINTESTINAL: No abdominal pain, nausea, vomiting or diarrhea, melena or bright red blood per rectum. GENITOURINARY: No urinary frequency, urgency, hesitancy or dysuria. MUSCULOSKELETAL: No joint or muscle pain, no back pain, no recent trauma. DERMATOLOGIC: No rash, no itching, no lesions. ENDOCRINE: No polyuria, polydipsia, no  heat or cold intolerance. No recent change in weight. HEMATOLOGICAL: No anemia or easy bruising or bleeding. NEUROLOGIC: No headache, seizures, numbness, tingling or weakness. PSYCHIATRIC: No depression, no loss of interest in normal activity or change in sleep pattern.     Exam:   BP 124/80   Ht 5\' 3"  (1.6 m)   Wt 174 lb (78.9 kg)   LMP 11/24/2010   BMI 30.82 kg/m   Body mass index is 30.82 kg/m.  General appearance : Well developed well nourished female. No acute distress HEENT: Eyes: no retinal hemorrhage or exudates,  Neck supple, trachea midline, no carotid bruits, no thyroidmegaly Lungs: Clear to auscultation, no rhonchi or wheezes, or rib retractions  Heart: Regular rate and rhythm, no murmurs or gallops Breast:Examined in sitting and supine position were symmetrical in appearance, no palpable masses or tenderness,  no skin retraction, no nipple inversion, no nipple discharge, no skin discoloration, no axillary or supraclavicular lymphadenopathy Abdomen: no palpable masses or tenderness, no rebound or guarding Extremities: no edema or skin discoloration or tenderness  Pelvic: Vulva: Normal             Vagina: No gross lesions or discharge.  Pap reflex done.  Cervix/Uterus absent  Adnexa  Without masses or tenderness  Anus: Normal   Assessment/Plan:  55 y.o. female for annual exam   1. Encounter for Papanicolaou smear of vagina as part  of routine gynecological examination Gynecologic exam status post total hysterectomy.  Pap reflex done at the vaginal vault.  Breast exam normal.  Screening mammogram May 2021 was negative.  Colonoscopy September 2019.  Health labs with family physician.  2. S/P laparoscopic assisted vaginal hysterectomy (LAVH)  3. Perimenopause Status post LAVH.  Probably perimenopausal.  Will check [place] level today. - FSH  4. Class 1 obesity due to excess calories without serious comorbidity with body mass index (BMI) of 30.0 to 30.9 in adult Recommend  a lower calorie/carb diet.  Intermittent fasting to consider.  Aerobic activities 5 times a week and light weightlifting every 2 days.  Princess Bruins MD, 2:57 PM 05/20/2020

## 2020-05-21 LAB — PAP IG W/ RFLX HPV ASCU

## 2020-05-21 LAB — FOLLICLE STIMULATING HORMONE: FSH: 15.8 m[IU]/mL

## 2020-05-26 ENCOUNTER — Other Ambulatory Visit: Payer: Self-pay

## 2020-05-26 MED ORDER — FLUCONAZOLE 150 MG PO TABS: 150.0000 mg | ORAL_TABLET | Freq: Once | ORAL | 0 refills | Status: AC

## 2020-06-03 ENCOUNTER — Other Ambulatory Visit: Payer: Self-pay | Admitting: Family Medicine

## 2020-06-03 MED ORDER — TRAMADOL HCL 50 MG PO TABS: 50.0000 mg | ORAL_TABLET | Freq: Two times a day (BID) | ORAL | 0 refills | Status: DC | PRN

## 2020-06-03 NOTE — Telephone Encounter (Signed)
Name of [place] Name of Pharmacy:[facility] [place] or Written Date and Quantity:05/07/20 #60 tabs with 0 refills Last Office Visit and Type:11/25/19 appt with Dr. Lorelei Pont Next Office Visit and Type:none scheduled

## 2020-06-29 ENCOUNTER — Telehealth: Payer: Self-pay

## 2020-06-29 NOTE — Telephone Encounter (Signed)
Pt left v/m that her lt breast has felt irritated for 2 - 3 days and when pt squeezed left nipple there was a miky discharge that came out that had blood in it.  No discharge now and no soreness. No CP and no fever. Pt thinks it is time for yearly mammogram. Pt scheduled appt with R Baity NP 06/30/20 at 3PM. Sending note to Avie Echevaria NP and DR Glori Bickers as PCP.no covid symptoms or exposure per pt.

## 2020-06-30 ENCOUNTER — Encounter: Payer: Self-pay | Admitting: Internal Medicine

## 2020-06-30 ENCOUNTER — Other Ambulatory Visit: Payer: Self-pay

## 2020-06-30 ENCOUNTER — Ambulatory Visit (INDEPENDENT_AMBULATORY_CARE_PROVIDER_SITE_OTHER): Payer: 59 | Admitting: Internal Medicine

## 2020-06-30 VITALS — BP 116/72 | HR 76 | Temp 97.6°F | Wt 173.0 lb

## 2020-06-30 DIAGNOSIS — N6452 Nipple discharge: Secondary | ICD-10-CM | POA: Diagnosis not present

## 2020-06-30 DIAGNOSIS — N644 Mastodynia: Secondary | ICD-10-CM | POA: Diagnosis not present

## 2020-06-30 NOTE — Telephone Encounter (Signed)
Will discuss at upcoming appt.

## 2020-06-30 NOTE — Progress Notes (Signed)
Subjective:    Patient ID: [name], female    DOB: 1965/11/17, 55 y.o.   MRN: [id number]  HPI  Pt presents to the clinic today with c/o nipple tenderness of her left breast and nipple discharge. She noticed this 1 week ago, it has not occurred since that time. She has not noticed any masses or lumps within the breast. Her last mammogram was 08/2019, normal. She has no family history of breast cancer.   Review of Systems      Past Medical History:  Diagnosis Date  . 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 GERD (gastroesophageal reflux disease)   . Hiatal hernia   . HPV in female    also some abnormal paps in past  . PPD positive    Hx of  . Renal lithiasis 2013  . Sickle cell trait ([place])     Current Outpatient Medications  Medication Sig Dispense Refill  . amitriptyline (ELAVIL) 10 MG tablet TAKE 1 TO 2 TABLETS(10 TO 20 MG) BY MOUTH AT BEDTIME (Patient not taking: Reported on 05/20/2020) 60 tablet 5  . linaclotide (LINZESS) 145 MCG CAPS capsule Take 1 capsule (145 mcg total) by mouth daily as needed. 90 capsule 1  . montelukast (SINGULAIR) 10 MG tablet Take 1 tablet (10 mg total) by mouth at bedtime. 90 tablet 3  . SUMAtriptan (IMITREX) 100 MG tablet Take 1 tablet earliest onset of migraine.  May repeat x1 in 2 hours if headache persists or recurs. (Patient not taking: Reported on 05/20/2020) 10 tablet 5  . tiZANidine (ZANAFLEX) 2 MG tablet Take 1 tablet (2 mg total) by mouth at bedtime. 30 tablet 2  . traMADol (ULTRAM) 50 MG tablet Take 1 tablet (50 mg total) by mouth 2 (two) times daily as needed for severe pain. 60 tablet 0   No current facility-administered medications for this visit.    Allergies  Allergen Reactions  . Banana Itching    Throat itches  . Morphine Itching    Family History  Problem Relation Age of Onset  . Anesthesia problems Mother   . Hypertension Mother   . Cancer Mother        Lymphoma  . Aneurysm Father   . Breast cancer  Maternal Aunt 67  . Cancer Paternal Aunt        uterine CA  . Lupus Cousin   . Prostate cancer Brother   . Colon cancer Neg Hx   . Esophageal cancer Neg Hx   . Stomach cancer Neg Hx   . Rectal cancer Neg Hx     Social History   Socioeconomic History  . Marital status: Married    Spouse name: [name]  . Number of children: 3  . Years of education: College  . Highest education level: Not on file  Occupational History  . Occupation: Therapist, music: [employer]  Tobacco Use  . Smoking status: Never Smoker  . Smokeless tobacco: Never Used  Vaping Use  . Vaping Use: Never used  Substance and Sexual Activity  . Alcohol use: Yes    Alcohol/week: 0.0 standard drinks    Comment: Rare  . Drug use: No  . Sexual activity: Yes    Partners: Male    Birth control/protection: Surgical    Comment: 1st intercourse 55 yo-Fewer than 5 partners, MARRIED- 72 YRS  Other Topics Concern  . Not on file  Social History Narrative   Pt lives  at home with her spouse.   Caffeine Use: 1-2 cups daily; sodas occasionally   Social Determinants of Health   Financial Resource Strain: Not on file  Food Insecurity: Not on file  Transportation Needs: Not on file  Physical Activity: Not on file  Stress: Not on file  Social Connections: Not on file  Intimate Partner Violence: Not on file     Constitutional: Denies fever, malaise, fatigue, headache or abrupt weight changes.  Respiratory: Denies difficulty breathing, shortness of breath, cough or sputum production.   Cardiovascular: Denies chest pain, chest tightness, palpitations or swelling in the hands or feet.  Skin: Pt reports nipple tenderness of left breast and nipple discharge. Denies redness, rashes, lesions or ulcercations.   No other specific complaints in a complete review of systems (except as listed in HPI above).  Objective:   Physical Exam   BP 116/72   Pulse 76   Temp 97.6 F (36.4 C) (Temporal) Comment: 97.6  Wt 173  lb (78.5 kg)   LMP 11/24/2010   SpO2 98%   BMI 30.65 kg/m   Wt Readings from Last 3 Encounters:  05/20/20 174 lb (78.9 kg)  11/25/19 169 lb 4 oz (76.8 kg)  09/24/19 164 lb 4 oz (74.5 kg)    General: Appears her stated age, well developed, well nourished in NAD. Breasts:  Symmetrical. Breast without masses or lumps. Scant bloody discharge expressed from the left nipple. No axillary lymphadenopathy. Cardiovascular: Normal rate. Pulmonary/Chest: Normal effort. Neurological: Alert and oriented.   BMET    Component Value Date/Time   NA 139 03/05/2019 0753   K 3.5 03/05/2019 0753   CL 106 03/05/2019 0753   CO2 25 03/05/2019 0753   GLUCOSE 118 (H) 03/05/2019 0753   BUN 8 03/05/2019 0753   CREATININE 0.87 03/05/2019 0753   CREATININE 0.73 04/20/2012 1522   CALCIUM 8.7 03/05/2019 0753   GFRNONAA >60 09/05/2015 1853   GFRAA >60 09/05/2015 1853    Lipid Panel     Component Value Date/Time   CHOL 184 03/05/2019 0753   TRIG 66.0 03/05/2019 0753   HDL 41.90 03/05/2019 0753   CHOLHDL 4 03/05/2019 0753   VLDL 13.2 03/05/2019 0753   LDLCALC 129 (H) 03/05/2019 0753    CBC    Component Value Date/Time   WBC 7.7 03/05/2019 0753   RBC 4.79 03/05/2019 0753   HGB 12.6 03/05/2019 0753   HCT 38.1 03/05/2019 0753   PLT 314.0 03/05/2019 0753   MCV 79.5 03/05/2019 0753   MCH 25.9 (L) 09/05/2015 1853   MCHC 32.9 03/05/2019 0753   RDW 14.2 03/05/2019 0753   LYMPHSABS 3.4 03/05/2019 0753   MONOABS 0.6 03/05/2019 0753   EOSABS 0.2 03/05/2019 0753   BASOSABS 0.1 03/05/2019 0753    Hgb A1C No results found for: HGBA1C         Assessment & Plan:   Nipple Pain, Nipple Discharge, Left:  Will obtain diagnostic mammogram Will obtain ultrasound of bilateral breasts  Will follow up after imaging, return precautions discussed  Webb Silversmith, NP This visit occurred during the SARS-CoV-2 public health emergency.  Safety protocols were in place, including screening questions prior  to the visit, additional usage of staff PPE, and extensive cleaning of exam room while observing appropriate contact time as indicated for disinfecting solutions.

## 2020-06-30 NOTE — Patient Instructions (Signed)
Galactorrhea Galactorrhea is the flow of a milky fluid (discharge) from the breast. It is different from normal milk in nursing mothers. The fluid can be white, yellow, or green. This condition can be caused by many things. Most cases are not serious and do not require treatment. Watch your condition to make sure it goes away. What are the causes?  Irritation of the breast. This may be caused by: ? Injury on the breast. ? Touching breasts during sex. ? Clothes rubbing against the nipple.  Some medicines, birth control pills, or herbs.  Changes in hormones.  Stress. What are the signs or symptoms? The main symptom of this condition is a milky discharge from the breast. The discharge may be white, yellow, or green. How is this treated? You may get well without treatment. Your doctor will watch your condition to make sure that it gets better. Sometimes your doctor will decide that you need treatment. If you need treatment:  The doctor will treat any injury or irritation on your breast.  The doctor will treat you for problems in your hormones.  You may be asked to stop some medicines, if they are causing your symptoms. Follow these instructions at home: Breast care  Watch your condition for any changes.  Do not squeeze your breasts or nipples.  Avoid touching your breasts when you are having sex.  Perform a breast self-exam once a month.  Avoid clothes that rub on your nipples.  Use breast pads to absorb the fluid.  Wear a support bra or a breast binder.   General instructions  Take over-the-counter and prescription medicines only as told by your doctor.  Keep all follow-up visits. Contact a doctor if:  You have hot flashes.  You have vaginal dryness.  You have no desire for sex.  You stop having periods, or have periods that are irregular or far apart.  You have headaches.  You cannot see well. Get help right away if:  Your breast discharge is bloody or  yellowish and looks like pus.  You have breast pain.  You feel a lump in your breast.  Your breast shows wrinkling or dimpling.  Your breast becomes red and swollen. Summary  Galactorrhea is the flow of a milky fluid (discharge) from the breast.  This condition may be caused by many things, but it is not usually serious.  Watch your condition carefully to make sure that it goes away.  Get help right away if the fluid is bloody or yellowish, or if you have a lump, pain, or skin changes on your breast. This information is not intended to replace advice given to you by your health care provider. Make sure you discuss any questions you have with your health care provider. Document Revised: 01/20/2020 Document Reviewed: 01/20/2020 Elsevier Patient Education  2021 [employer].

## 2020-07-03 ENCOUNTER — Other Ambulatory Visit: Payer: Self-pay | Admitting: Family Medicine

## 2020-07-03 MED ORDER — TRAMADOL HCL 50 MG PO TABS
50.0000 mg | ORAL_TABLET | Freq: Two times a day (BID) | ORAL | 0 refills | Status: DC | PRN
Start: 2020-07-03 — End: 2020-08-04

## 2020-07-03 NOTE — Telephone Encounter (Signed)
Name of [place] Name of Pharmacy:[facility] [place] or Written Date and Quantity:06/03/20#60 tabs with 0 refills Last Office Visit and Type:06/30/20 Breast issues (with Rollene Fare) Next Office Visit and Type:none scheduled

## 2020-08-04 ENCOUNTER — Other Ambulatory Visit: Payer: Self-pay | Admitting: Family Medicine

## 2020-08-04 MED ORDER — TRAMADOL HCL 50 MG PO TABS: 50.0000 mg | ORAL_TABLET | Freq: Two times a day (BID) | ORAL | 0 refills | Status: DC | PRN

## 2020-08-04 NOTE — Telephone Encounter (Signed)
Name of [place] Name of Pharmacy:[facility] [place] or Written Date and Quantity:07/03/20#60 tabs with 0 refills Last Office Visit and Type:06/30/20 Breast issues (with Rollene Fare) Next Office Visit and Type:none scheduled

## 2020-08-10 ENCOUNTER — Other Ambulatory Visit: Payer: 59

## 2020-09-02 ENCOUNTER — Other Ambulatory Visit: Payer: Self-pay | Admitting: Family Medicine

## 2020-09-02 MED ORDER — TRAMADOL HCL 50 MG PO TABS: 50.0000 mg | ORAL_TABLET | Freq: Two times a day (BID) | ORAL | 0 refills | Status: DC | PRN

## 2020-09-02 NOTE — Telephone Encounter (Signed)
Name of [place] Name of Pharmacy:[facility] [place] or Written Date and Quantity:08/04/20#60 tabs with 0 refills Last Office Visit and Type:06/30/20 Breast issues (with Rollene Fare) Next Office Visit and Type:none scheduled

## 2020-09-10 ENCOUNTER — Other Ambulatory Visit: Payer: 59

## 2020-10-01 ENCOUNTER — Other Ambulatory Visit: Payer: Self-pay | Admitting: Family Medicine

## 2020-10-01 MED ORDER — TRAMADOL HCL 50 MG PO TABS: 50.0000 mg | ORAL_TABLET | Freq: Two times a day (BID) | ORAL | 0 refills | Status: DC | PRN

## 2020-10-01 NOTE — Telephone Encounter (Signed)
Last office visit 06/30/2020 with [name] for Nipple Discharge.  Last refilled 09/02/2020 for #60 with no refills.  No future appointments with PCP.

## 2020-10-11 ENCOUNTER — Telehealth: Payer: Self-pay | Admitting: Family Medicine

## 2020-10-12 ENCOUNTER — Ambulatory Visit (INDEPENDENT_AMBULATORY_CARE_PROVIDER_SITE_OTHER): Payer: 59 | Admitting: Family

## 2020-10-12 ENCOUNTER — Encounter: Payer: Self-pay | Admitting: Family

## 2020-10-12 ENCOUNTER — Other Ambulatory Visit: Payer: Self-pay

## 2020-10-12 VITALS — BP 151/83 | HR 87 | Temp 98.1°F | Ht 63.0 in | Wt 177.2 lb

## 2020-10-12 DIAGNOSIS — M5412 Radiculopathy, cervical region: Secondary | ICD-10-CM

## 2020-10-12 MED ORDER — PREDNISONE 20 MG PO TABS: 40.0000 mg | ORAL_TABLET | Freq: Every day | ORAL | 0 refills | Status: DC

## 2020-10-12 MED ORDER — TRAMADOL HCL 50 MG PO TABS: 50.0000 mg | ORAL_TABLET | Freq: Two times a day (BID) | ORAL | 0 refills | Status: DC | PRN

## 2020-10-12 NOTE — Telephone Encounter (Signed)
Last CPE was 03/12/2019 pt has had multiple acute appts with other providers but no recent or future appt with PCP will refill singulair once and route to support pool to get a CPE or at least a med refill f/u appt scheduled with PCP soon

## 2020-10-12 NOTE — Patient Instructions (Signed)

## 2020-10-13 NOTE — Telephone Encounter (Signed)
Spoke with patient scheduled CPE with fasting labs 

## 2020-10-15 NOTE — Progress Notes (Signed)
Acute Office Visit  Subjective:    Patient ID: [name], female    DOB: [birth year], 55 y.o.   MRN: [id number]  Chief Complaint  Patient presents with  . Wrist Pain    Left wrist; started yesterday. Pt says that she has not been able to close her fist. She complains of pain from left shoulder down to wrist.     HPI Patient is in today with c/o pain in her left arm from her shoulder down to her wrist that began yesterday. She reports waking up and feeling this way. She shared her bed with her grandson. Pain is now more focused in her lower left arm and wrist. She has been using a wrist brace but unsure if it really helps. Denies injury. Has a history of neck pain/injury in the past.   Past Medical History:  Diagnosis Date  . 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 GERD (gastroesophageal reflux disease)   . Hiatal hernia   . HPV in female    also some abnormal paps in past  . PPD positive    Hx of  . Renal lithiasis 2013  . Sickle cell trait [facility])     Past Surgical History:  Procedure Laterality Date  . APPENDECTOMY     1990's  . BREAST EXCISIONAL BIOPSY Left 2014  . BREAST SURGERY     removal of L breast mass / benign /age [age]  . CESAREAN SECTION     x3  . CYSTOSCOPY  2008   L RPG, stone extraction ([place])  . ESOPHAGOGASTRODUODENOSCOPY  06/15/2012   Several----normal   . FOOT SURGERY  JULY 2013   RIGHT  . LAPAROSCOPIC VAGINAL HYSTERECTOMY  12/02/2010   menorrhagia, leiomyomata  . LAPAROSCOPY N/A 05/24/2012   Procedure: LAPAROSCOPY OPERATIVE;  Surgeon: Anastasio Auerbach, MD;  Location: [facility];  Service: Gynecology;  Laterality: N/A;  . PELVIC LAPAROSCOPY     x2 for cysts r/o endometriosis  . SALPINGOOPHORECTOMY Right 05/24/2012   Procedure: SALPINGO OOPHORECTOMY;  Surgeon: Anastasio Auerbach, MD;  Location: [facility];  Service: Gynecology;  Laterality: Right;  . TONSILLECTOMY AND ADENOIDECTOMY     age 39  . TUBAL LIGATION     BTL    Family  History  Problem Relation Age of Onset  . Anesthesia problems Mother   . Hypertension Mother   . Cancer Mother        Lymphoma  . Aneurysm Father   . Breast cancer Maternal Aunt 20  . Cancer Paternal Aunt        uterine CA  . Lupus Cousin   . Prostate cancer Brother   . Colon cancer Neg Hx   . Esophageal cancer Neg Hx   . Stomach cancer Neg Hx   . Rectal cancer Neg Hx     Social History   Socioeconomic History  . Marital status: Married    Spouse name: [name]  . Number of children: 3  . Years of education: College  . Highest education level: Not on file  Occupational History  . Occupation: Therapist, music: [employer]  Tobacco Use  . Smoking status: Never  . Smokeless tobacco: Never  Vaping Use  . Vaping Use: Never used  Substance and Sexual Activity  . Alcohol use: Yes    Alcohol/week: 0.0 standard drinks    Comment: Rare  . Drug use: No  . Sexual activity: Yes  Partners: Male    Birth control/protection: Surgical    Comment: 1st intercourse 55 yo-Fewer than 5 partners, MARRIED- 46 YRS  Other Topics Concern  . Not on file  Social History Narrative   Pt lives at home with her spouse.   Caffeine Use: 1-2 cups daily; sodas occasionally   Social Determinants of Health   Financial Resource Strain: Not on file  Food Insecurity: Not on file  Transportation Needs: Not on file  Physical Activity: Not on file  Stress: Not on file  Social Connections: Not on file  Intimate Partner Violence: Not on file    Outpatient Medications Prior to Visit  Medication Sig Dispense Refill  . amitriptyline (ELAVIL) 10 MG tablet TAKE 1 TO 2 TABLETS(10 TO 20 MG) BY MOUTH AT BEDTIME 60 tablet 5  . linaclotide (LINZESS) 145 MCG CAPS capsule Take 1 capsule (145 mcg total) by mouth daily as needed. 90 capsule 1  . SUMAtriptan (IMITREX) 100 MG tablet Take 1 tablet earliest onset of migraine.  May repeat x1 in 2 hours if headache persists or recurs. (Patient taking  differently: Take 1 tablet earliest onset of migraine.  May repeat x1 in 2 hours if headache persists or recurs.) 10 tablet 5  . tiZANidine (ZANAFLEX) 2 MG tablet Take 1 tablet (2 mg total) by mouth at bedtime. 30 tablet 2  . montelukast (SINGULAIR) 10 MG tablet Take 1 tablet (10 mg total) by mouth at bedtime. 90 tablet 3  . traMADol (ULTRAM) 50 MG tablet Take 1 tablet (50 mg total) by mouth 2 (two) times daily as needed for severe pain. 60 tablet 0   No facility-administered medications prior to visit.    Allergies  Allergen Reactions  . Banana Itching    Throat itches  . Morphine Itching    Review of Systems  Constitutional: Negative.   Respiratory: Negative.    Cardiovascular: Negative.   Musculoskeletal:  Positive for arthralgias.       Left arm and hand pain  Skin: Negative.   Allergic/Immunologic: Negative.   Neurological:  Positive for weakness.       Left hand weakness  Psychiatric/Behavioral: Negative.    All other systems reviewed and are negative.     Objective:    Physical Exam Vitals and nursing note reviewed.  Constitutional:      Appearance: Normal appearance.  Cardiovascular:     Rate and Rhythm: Normal rate and regular rhythm.     Pulses: Normal pulses.     Heart sounds: Normal heart sounds.  Pulmonary:     Effort: Pulmonary effort is normal.     Breath sounds: Normal breath sounds.  Musculoskeletal:        General: Tenderness (left wrist and forearm) present. No swelling.     Cervical back: Normal range of motion and neck supple. No tenderness.  Skin:    General: Skin is warm and dry.  Neurological:     General: No focal deficit present.     Mental Status: She is alert and oriented to person, place, and time.     Motor: Weakness present.     Comments: Left hand decreased ROM and weakness noted . Unable to fully close her hand due to pain.   Psychiatric:        Mood and Affect: Mood normal.        Behavior: Behavior normal.   BP (!) 151/83    Pulse 87   Temp 98.1 F (36.7 C) (Temporal)   Ht  5\' 3"  (1.6 m)   Wt 177 lb 3.2 oz (80.4 kg)   LMP 11/24/2010   SpO2 100%   BMI 31.39 kg/m  Wt Readings from Last 3 Encounters:  10/12/20 177 lb 3.2 oz (80.4 kg)  06/30/20 173 lb (78.5 kg)  05/20/20 174 lb (78.9 kg)    Health Maintenance Due  Topic Date Due  . HIV Screening  Never done  . Hepatitis C Screening  Never done  . Zoster Vaccines- Shingrix (1 of 2) Never done  . COVID-19 Vaccine (2 - Pfizer series) 03/16/2020  . MAMMOGRAM  08/04/2020    There are no preventive care reminders to display for this patient.   Lab Results  Component Value Date   TSH 1.87 03/05/2019   Lab Results  Component Value Date   WBC 7.7 03/05/2019   HGB 12.6 03/05/2019   HCT 38.1 03/05/2019   MCV 79.5 03/05/2019   PLT 314.0 03/05/2019   Lab Results  Component Value Date   NA 139 03/05/2019   K 3.5 03/05/2019   CO2 25 03/05/2019   GLUCOSE 118 (H) 03/05/2019   BUN 8 03/05/2019   CREATININE 0.87 03/05/2019   BILITOT 0.6 03/05/2019   ALKPHOS 65 03/05/2019   AST 10 03/05/2019   ALT 9 03/05/2019   PROT 6.6 03/05/2019   ALBUMIN 3.9 03/05/2019   CALCIUM 8.7 03/05/2019   ANIONGAP 6 09/05/2015   GFR 82.35 03/05/2019   Lab Results  Component Value Date   CHOL 184 03/05/2019   Lab Results  Component Value Date   HDL 41.90 03/05/2019   Lab Results  Component Value Date   LDLCALC 129 (H) 03/05/2019   Lab Results  Component Value Date   TRIG 66.0 03/05/2019   Lab Results  Component Value Date   CHOLHDL 4 03/05/2019   No results found for: HGBA1C     Assessment & Plan:   Problem List Items Addressed This Visit   None Visit Diagnoses     Cervical radiculopathy    -  Primary        Meds ordered this encounter  Medications  . predniSONE (DELTASONE) 20 MG tablet    Sig: Take 2 tablets (40 mg total) by mouth daily with breakfast.    Dispense:  10 tablet    Refill:  0  . traMADol (ULTRAM) 50 MG tablet    Sig: Take  1 tablet (50 mg total) by mouth 2 (two) times daily as needed for severe pain.    Dispense:  60 tablet    Refill:  0   Plan: Call the office with any questions or concerns.   Kennyth Arnold, FNP

## 2020-10-23 ENCOUNTER — Ambulatory Visit
Admission: RE | Admit: 2020-10-23 | Discharge: 2020-10-23 | Disposition: A | Discharge status: Home / Self Care | Payer: 59 | Source: Ambulatory Visit | Attending: Internal Medicine | Admitting: Internal Medicine

## 2020-10-23 ENCOUNTER — Other Ambulatory Visit: Payer: Self-pay

## 2020-10-23 DIAGNOSIS — N6452 Nipple discharge: Secondary | ICD-10-CM

## 2020-11-16 ENCOUNTER — Telehealth: Payer: Self-pay | Admitting: Family Medicine

## 2020-11-16 DIAGNOSIS — R7309 Other abnormal glucose: Secondary | ICD-10-CM

## 2020-11-16 DIAGNOSIS — Z0000 Encounter for general adult medical examination without abnormal findings: Secondary | ICD-10-CM

## 2020-11-16 DIAGNOSIS — E785 Hyperlipidemia, unspecified: Secondary | ICD-10-CM

## 2020-11-16 NOTE — Telephone Encounter (Signed)
-----   Message from Cloyd Stagers, RT sent at 11/02/2020  9:55 AM EDT ----- Regarding: Lab Orders for Tuesday 8.16.2022 Please place lab orders for Tuesday 8.16.2022, office visit for physical on Tuesday 8.23.2022 Thank you, Dyke Maes RT(R)

## 2020-11-17 ENCOUNTER — Other Ambulatory Visit (INDEPENDENT_AMBULATORY_CARE_PROVIDER_SITE_OTHER): Payer: 59

## 2020-11-17 ENCOUNTER — Other Ambulatory Visit: Payer: Self-pay

## 2020-11-17 DIAGNOSIS — E785 Hyperlipidemia, unspecified: Secondary | ICD-10-CM | POA: Diagnosis not present

## 2020-11-17 DIAGNOSIS — Z0000 Encounter for general adult medical examination without abnormal findings: Secondary | ICD-10-CM

## 2020-11-17 DIAGNOSIS — R7309 Other abnormal glucose: Secondary | ICD-10-CM | POA: Diagnosis not present

## 2020-11-17 LAB — CBC WITH DIFFERENTIAL/PLATELET
Basophils Absolute: 0 10*3/uL (ref 0.0–0.1)
Basophils Relative: 0.7 % (ref 0.0–3.0)
Eosinophils Absolute: 0.3 10*3/uL (ref 0.0–0.7)
Eosinophils Relative: 4.9 % (ref 0.0–5.0)
HCT: 37.7 % (ref 36.0–46.0)
Hemoglobin: 12.7 g/dL (ref 12.0–15.0)
Lymphocytes Relative: 42.3 % (ref 12.0–46.0)
Lymphs Abs: 3 10*3/uL (ref 0.7–4.0)
MCHC: 33.7 g/dL (ref 30.0–36.0)
MCV: 78.3 fl (ref 78.0–100.0)
Monocytes Absolute: 0.7 10*3/uL (ref 0.1–1.0)
Monocytes Relative: 9.2 % (ref 3.0–12.0)
Neutro Abs: 3.1 10*3/uL (ref 1.4–7.7)
Neutrophils Relative %: 42.9 % — ABNORMAL LOW (ref 43.0–77.0)
Platelets: 323 10*3/uL (ref 150.0–400.0)
RBC: 4.82 Mil/uL (ref 3.87–5.11)
RDW: 14.6 % (ref 11.5–15.5)
WBC: 7.2 10*3/uL (ref 4.0–10.5)

## 2020-11-17 LAB — COMPREHENSIVE METABOLIC PANEL
ALT: 10 U/L (ref 0–35)
AST: 10 U/L (ref 0–37)
Albumin: 3.9 g/dL (ref 3.5–5.2)
Alkaline Phosphatase: 64 U/L (ref 39–117)
BUN: 12 mg/dL (ref 6–23)
CO2: 25 mEq/L (ref 19–32)
Calcium: 9.2 mg/dL (ref 8.4–10.5)
Chloride: 104 mEq/L (ref 96–112)
Creatinine, Ser: 0.99 mg/dL (ref 0.40–1.20)
GFR: 64.46 mL/min (ref >60.00)
Glucose, Bld: 110 mg/dL — ABNORMAL HIGH (ref 70–99)
Potassium: 4.1 mEq/L (ref 3.5–5.1)
Sodium: 137 mEq/L (ref 135–145)
Total Bilirubin: 0.6 mg/dL (ref 0.2–1.2)
Total Protein: 6.3 g/dL (ref 6.0–8.3)

## 2020-11-17 LAB — LIPID PANEL
Cholesterol: 193 mg/dL (ref 0–200)
HDL: 54.1 mg/dL (ref >39.00)
LDL Cholesterol: 124 mg/dL — ABNORMAL HIGH (ref 0–99)
NonHDL: 138.95
Total CHOL/HDL Ratio: 4
Triglycerides: 74 mg/dL (ref 0.0–149.0)
VLDL: 14.8 mg/dL (ref 0.0–40.0)

## 2020-11-17 LAB — TSH: TSH: 2.6 u[IU]/mL (ref 0.35–5.50)

## 2020-11-17 LAB — HEMOGLOBIN A1C: Hgb A1c MFr Bld: 6.3 % (ref 4.6–6.5)

## 2020-11-24 ENCOUNTER — Ambulatory Visit (INDEPENDENT_AMBULATORY_CARE_PROVIDER_SITE_OTHER): Payer: 59 | Admitting: Family Medicine

## 2020-11-24 ENCOUNTER — Other Ambulatory Visit: Payer: Self-pay

## 2020-11-24 ENCOUNTER — Encounter: Payer: Self-pay | Admitting: Family Medicine

## 2020-11-24 VITALS — BP 128/72 | HR 89 | Temp 97.7°F | Ht 62.75 in | Wt 176.3 lb

## 2020-11-24 DIAGNOSIS — R7309 Other abnormal glucose: Secondary | ICD-10-CM | POA: Diagnosis not present

## 2020-11-24 DIAGNOSIS — E785 Hyperlipidemia, unspecified: Secondary | ICD-10-CM

## 2020-11-24 DIAGNOSIS — J301 Allergic rhinitis due to pollen: Secondary | ICD-10-CM

## 2020-11-24 DIAGNOSIS — Z0000 Encounter for general adult medical examination without abnormal findings: Secondary | ICD-10-CM

## 2020-11-24 DIAGNOSIS — R519 Headache, unspecified: Secondary | ICD-10-CM

## 2020-11-24 DIAGNOSIS — J309 Allergic rhinitis, unspecified: Secondary | ICD-10-CM | POA: Insufficient documentation

## 2020-11-24 DIAGNOSIS — K5904 Chronic idiopathic constipation: Secondary | ICD-10-CM | POA: Diagnosis not present

## 2020-11-24 NOTE — Assessment & Plan Note (Signed)
singulaire prn- helps  Encourage allergen avoidance when able

## 2020-11-24 NOTE — Assessment & Plan Note (Signed)
Continues linzess prn

## 2020-11-24 NOTE — Assessment & Plan Note (Signed)
Disc goals for lipids and reasons to control them Rev last labs with pt Rev low sat fat diet in detail Stable LDL of 124 and increased HDL  Diet controlled Plans to cut back on red meat

## 2020-11-24 NOTE — Patient Instructions (Addendum)
If you are interested in the shingles vaccine series (Shingrix), call your insurance or pharmacy to check on coverage and location it must be given.  If affordable - you can schedule it here or at your pharmacy depending on coverage   Get your flu shot in the fall   If nipple discharge comes back please let us know   To prevent diabetes Try to get most of your carbohydrates from produce (with the exception of white potatoes)  Eat less bread/pasta/rice/snack foods/cereals/sweets and other items from the middle of the grocery store (processed carbs)  For cholesterol  Avoid red meat/ fried foods/ egg yolks/ fatty breakfast meats/ butter, cheese and high fat dairy/ and shellfish

## 2020-11-24 NOTE — Assessment & Plan Note (Signed)
Reviewed health habits including diet and exercise and skin cancer prevention Reviewed appropriate screening tests for age  Also reviewed health mt list, fam hx and immunization status , as well as social and family history   See HPI Labs reviewed  Discussed grief/lost son in [place] a year ago  Good self care  May look into shingrix coverage  Had one covid imm with a reaction  Plans flu shot in the fall  Mammogram utd-declines further eval of nipple d/c and it has stopped  Pap utd from gyn 2/22  colonsocoy utd  Commended great exercise  Habits

## 2020-11-24 NOTE — Assessment & Plan Note (Signed)
Lab Results  Component Value Date   HGBA1C 6.3 11/17/2020   disc imp of low glycemic diet and wt loss to prevent DM2  Handouts given Good exercise habits

## 2020-11-24 NOTE — Assessment & Plan Note (Addendum)
Less frequent but headaches last a longer time  Recent cervical radiculopathy may trigger  Has stopped using a pillow  Has tramadol for prn use/warned of potential rebound

## 2020-11-24 NOTE — Progress Notes (Signed)
Subjective:    Patient ID: [name], female    DOB: Sep 17, 1965, 55 y.o.   MRN: [id number]  This visit occurred during the SARS-CoV-2 public health emergency.  Safety protocols were in place, including screening questions prior to the visit, additional usage of staff PPE, and extensive cleaning of exam room while observing appropriate contact time as indicated for disinfecting solutions.   HPI Here for health maintenance exam and to review chronic medical problems    Wt Readings from Last 3 Encounters:  11/24/20 176 lb 5 oz (80 kg)  10/12/20 177 lb 3.2 oz (80.4 kg)  06/30/20 173 lb (78.5 kg)   31.48 kg/m   Lost son in car accident a year ago Getting some counseling  Thinks she is getting by  Has support as well   Taking care of herself    Zoster status -may be interested in shingrix/will check on coverage  Covid -one imm Flu shot-fall Td 12/19   Mammogram 7/22 H/o L nipple discharge - mam and [place] were nl, she declined surgical ref or other intervention  Had d/c only when expressed, now has resolved  Self breast exam-no lumps  Maunt had breast cancer   Mother had lymphoma    Pap Dr Scherrie Bateman 05/20/20 nl  Gets them every November now  She had a hysterectomy for heavy bleeding and fibroids  Prior to that she had some abn paps -not reason for the surgery  Still has one ovary    Colonoscopy 9/19 with 5 y recall  Has chronic constipation  Linzess helps prn    BP Readings from Last 3 Encounters:  11/24/20 128/72  10/12/20 (!) 151/83  06/30/20 116/72   Pulse Readings from Last 3 Encounters:  11/24/20 89  10/12/20 87  06/30/20 76   Eats healthy overall  Goes to the gym 4 d per week   Chronic headaches  A little better -if she gets a slight headache it lasts a long time  Last one was not too severe   Cervical radiculopathy  Better off to not use a pillow  Takes tramadol for these  Still takes singulair  Works well for allergic rhinitis     Past  elevated glucose Lab Results  Component Value Date   HGBA1C 6.3 11/17/2020  Not a big sweet eater Some sugar in coffee (drinking less of that)  Goes to the gym    Hyperlipidemia  Lab Results  Component Value Date   CHOL 193 11/17/2020   CHOL 184 03/05/2019   CHOL 200 03/07/2018   Lab Results  Component Value Date   HDL 54.10 11/17/2020   HDL 41.90 03/05/2019   HDL 51.60 03/07/2018   Lab Results  Component Value Date   LDLCALC 124 (H) 11/17/2020   LDLCALC 129 (H) 03/05/2019   LDLCALC 128 (H) 03/07/2018   Lab Results  Component Value Date   TRIG 74.0 11/17/2020   TRIG 66.0 03/05/2019   TRIG 98.0 03/07/2018   Lab Results  Component Value Date   CHOLHDL 4 11/17/2020   CHOLHDL 4 03/05/2019   CHOLHDL 4 03/07/2018   No results found for: LDLDIRECT Diet controlled Careful with fatty foods Used to eat a lot less red meat   Patient Active Problem List   Diagnosis Date Noted   Allergic rhinitis 11/24/2020   Right leg pain 07/17/2019   Elevated glucose 03/12/2019   Mild hyperlipidemia 03/13/2018   Chronic idiopathic constipation 01/24/2018   Hernia, abdominal 12/14/2015   Chronic  daily headache 05/28/2015   Family history of brain aneurysm 01/07/2013   Eczema 12/21/2012   Routine general medical examination at a health care facility 08/08/2012   GERD 08/20/2008   RENAL CALCULUS, HX OF 08/20/2008   Past Medical History:  Diagnosis Date   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   GERD (gastroesophageal reflux disease)    Hiatal hernia    HPV in female    also some abnormal paps in past   PPD positive    Hx of   Renal lithiasis 2013   Sickle cell trait ([place])    Past Surgical History:  Procedure Laterality Date   APPENDECTOMY     1990's   BREAST EXCISIONAL BIOPSY Left 2014   BREAST SURGERY     removal of L breast mass / benign /age [age]   CESAREAN SECTION     x3   CYSTOSCOPY  2008   L RPG, stone extraction ([place])    ESOPHAGOGASTRODUODENOSCOPY  06/15/2012   Several----normal    FOOT SURGERY  JULY 2013   RIGHT   LAPAROSCOPIC VAGINAL HYSTERECTOMY  12/02/2010   menorrhagia, leiomyomata   LAPAROSCOPY N/A 05/24/2012   Procedure: LAPAROSCOPY OPERATIVE;  Surgeon: Anastasio Auerbach, MD;  Location: [facility];  Service: Gynecology;  Laterality: N/A;   PELVIC LAPAROSCOPY     x2 for cysts r/o endometriosis   SALPINGOOPHORECTOMY Right 05/24/2012   Procedure: SALPINGO OOPHORECTOMY;  Surgeon: Anastasio Auerbach, MD;  Location: [facility];  Service: Gynecology;  Laterality: Right;   TONSILLECTOMY AND ADENOIDECTOMY     age 13   TUBAL LIGATION     BTL   Social History   Tobacco Use   Smoking status: Never   Smokeless tobacco: Never  Vaping Use   Vaping Use: Never used  Substance Use Topics   Alcohol use: Yes    Alcohol/week: 0.0 standard drinks    Comment: Rare   Drug use: No   Family History  Problem Relation Age of Onset   Anesthesia problems Mother    Hypertension Mother    Cancer Mother        Lymphoma   Aneurysm Father    Breast cancer Maternal Aunt 20   Cancer Paternal Aunt        uterine CA   Lupus Cousin    Prostate cancer Brother    Colon cancer Neg Hx    Esophageal cancer Neg Hx    Stomach cancer Neg Hx    Rectal cancer Neg Hx    Allergies  Allergen Reactions   Banana Itching    Throat itches   Morphine Itching   Current Outpatient Medications on File Prior to Visit  Medication Sig Dispense Refill   linaclotide (LINZESS) 145 MCG CAPS capsule Take 1 capsule (145 mcg total) by mouth daily as needed. 90 capsule 1   montelukast (SINGULAIR) 10 MG tablet TAKE 1 TABLET(10 MG) BY MOUTH AT BEDTIME 90 tablet 0   traMADol (ULTRAM) 50 MG tablet Take 1 tablet (50 mg total) by mouth 2 (two) times daily as needed for severe pain. 60 tablet 0   No current facility-administered medications on file prior to visit.     Review of Systems  Constitutional:  Positive for fatigue. Negative for activity  change, appetite change, fever and unexpected weight change.  HENT:  Negative for congestion, ear pain, rhinorrhea, sinus pressure and sore throat.   Eyes:  Negative for pain, redness and visual disturbance.  Respiratory:  Negative for cough, shortness of breath and wheezing.   Cardiovascular:  Negative for chest pain and palpitations.  Gastrointestinal:  Negative for abdominal pain, blood in stool, constipation and diarrhea.  Endocrine: Negative for polydipsia and polyuria.  Genitourinary:  Negative for dysuria, frequency and urgency.  Musculoskeletal:  Negative for arthralgias, back pain and myalgias.  Skin:  Negative for pallor and rash.  Allergic/Immunologic: Negative for environmental allergies.  Neurological:  Negative for dizziness, syncope and headaches.  Hematological:  Negative for adenopathy. Does not bruise/bleed easily.  Psychiatric/Behavioral:  Negative for decreased concentration and dysphoric mood. The patient is not nervous/anxious.        Grief       Objective:   Physical Exam Constitutional:      General: She is not in acute distress.    Appearance: Normal appearance. She is well-developed. She is obese. She is not ill-appearing or diaphoretic.  HENT:     Head: Normocephalic and atraumatic.     Right Ear: Tympanic membrane, ear canal and external ear normal.     Left Ear: Tympanic membrane, ear canal and external ear normal.     Nose: Nose normal. No congestion.     Mouth/Throat:     Mouth: Mucous membranes are moist.     Pharynx: Oropharynx is clear. No posterior oropharyngeal erythema.  Eyes:     General: No scleral icterus.    Extraocular Movements: Extraocular movements intact.     Conjunctiva/sclera: Conjunctivae normal.     Pupils: Pupils are equal, round, and reactive to light.  Neck:     Thyroid: No thyromegaly.     Vascular: No carotid bruit or JVD.  Cardiovascular:     Rate and Rhythm: Normal rate and regular rhythm.     Pulses: Normal pulses.      Heart sounds: Normal heart sounds.    No gallop.  Pulmonary:     Effort: Pulmonary effort is normal. No respiratory distress.     Breath sounds: Normal breath sounds. No wheezing.     Comments: Good air exch Chest:     Chest wall: No tenderness.  Abdominal:     General: Bowel sounds are normal. There is no distension or abdominal bruit.     Palpations: Abdomen is soft. There is no mass.     Tenderness: There is no abdominal tenderness.     Hernia: No hernia is present.  Genitourinary:    Comments: Breast exam: No mass, nodules, thickening, tenderness, bulging, retraction, inflamation, nipple discharge or skin changes noted.  No axillary or clavicular LA.     Dense tissue noted  No nipple d/c Musculoskeletal:        General: No tenderness. Normal range of motion.     Cervical back: Normal range of motion and neck supple. No rigidity. No muscular tenderness.     Right lower leg: No edema.     Left lower leg: No edema.  Lymphadenopathy:     Cervical: No cervical adenopathy.  Skin:    General: Skin is warm and dry.     Coloration: Skin is not pale.     Findings: No erythema or rash.     Comments: Few skin tags   Neurological:     Mental Status: She is alert. Mental status is at baseline.     Cranial Nerves: No cranial nerve deficit.     Motor: No abnormal muscle tone.     Coordination: Coordination normal.     Gait: Gait normal.  Deep Tendon Reflexes: Reflexes are normal and symmetric. Reflexes normal.  Psychiatric:        Mood and Affect: Mood normal.     Comments: Slt tearful when discussing death of her son          Assessment & Plan:   Problem List Items Addressed This Visit       Respiratory   Allergic rhinitis    singulaire prn- helps  Encourage allergen avoidance when able        Digestive   Chronic idiopathic constipation    Continues linzess prn         Other   Routine general medical examination at a health care facility - Primary    Reviewed  health habits including diet and exercise and skin cancer prevention Reviewed appropriate screening tests for age  Also reviewed health mt list, fam hx and immunization status , as well as social and family history   See HPI Labs reviewed  Discussed grief/lost son in [place] a year ago  Good self care  May look into shingrix coverage  Had one covid imm with a reaction  Plans flu shot in the fall  Mammogram utd-declines further eval of nipple d/c and it has stopped  Pap utd from gyn 2/22  colonsocoy utd  Commended great exercise  Habits       Chronic daily headache    Less frequent but headaches last a longer time  Recent cervical radiculopathy may trigger  Has stopped using a pillow  Has tramadol for prn use/warned of potential rebound        Mild hyperlipidemia    Disc goals for lipids and reasons to control them Rev last labs with pt Rev low sat fat diet in detail Stable LDL of 124 and increased HDL  Diet controlled Plans to cut back on red meat      Elevated glucose    Lab Results  Component Value Date   HGBA1C 6.3 11/17/2020  disc imp of low glycemic diet and wt loss to prevent DM2  Handouts given Good exercise habits

## 2020-12-01 ENCOUNTER — Other Ambulatory Visit: Payer: Self-pay | Admitting: Family Medicine

## 2020-12-02 ENCOUNTER — Telehealth: Payer: Self-pay | Admitting: *Deleted

## 2020-12-02 NOTE — Telephone Encounter (Signed)
Pt said she doesn't have any pain just weakness with that arm. Pt said it started right after she did some boxing and she doesn't usually normally do boxing. Pt said since coming home her weakness is starting to improve. I went over stroke sxs and she isn't having any sxs and really doesn't think it's a stroke since her arm is starting to feel better. ER precautions still given, pt will rest arm today and if sxs don't continue to improve she will make an appt for eval or if any major concerns she will go to the ER  Pt advise to keep [place] posted   FYI to PCP

## 2020-12-02 NOTE — Telephone Encounter (Signed)
Patient called stating that she went to the gym this morning like she usually does at least 4 days a week. Patient stated that did a little boxing and lifted weights once.  Patient stated that all of a sudden she started with weakness in her right arm. Patient stated that her right arm and hand started shaking. Patient stated that she did not do anything else. Patient stated that she has some weakness in that arm and a little soreness close to the elbow. Patient denies any chest pain or any other symptoms. Patient stated that it is hard for her to make a fist and difficult to write.  Patient stated that this is only affecting  her right arm and hand. Patient stated other than her right hand she feels fine. Patient wants to know if Dr Glori Bickers thinks that she could have done something to her arm at the gym. Patient wants to know what Dr. Glori Bickers would recommend.

## 2020-12-02 NOTE — Telephone Encounter (Signed)
If pain is associated with the weakness, it is likely injury or overuse.  If so rest the affected area and use some ice and make an appt If it is weakness without pain (sudden loss of strength or loss of sensation)-that would warrant ER eval for neuro cause like stroke (in that case immediate eval in ER)

## 2020-12-02 NOTE — Telephone Encounter (Signed)
Name of Medication: Tramadol Name of Pharmacy: [facility] or Written Date and Quantity: 10/12/20 #60 tab/ 0 refills Last Office Visit and Type: CPE on 11/24/20 Next Office Visit and Type: no future

## 2021-01-01 ENCOUNTER — Other Ambulatory Visit: Payer: Self-pay | Admitting: Family Medicine

## 2021-01-03 ENCOUNTER — Other Ambulatory Visit: Payer: Self-pay | Admitting: Family Medicine

## 2021-01-04 MED ORDER — TRAMADOL HCL 50 MG PO TABS: 50.0000 mg | ORAL_TABLET | Freq: Two times a day (BID) | ORAL | 0 refills | Status: DC | PRN

## 2021-01-04 NOTE — Telephone Encounter (Signed)
Name of Medication: Tramadol Name of Pharmacy: [facility] or Written Date and Quantity: 11/3120 #60 tab/ 0 refills Last Office Visit and Type: CPE on 11/24/20 Next Office Visit and Type: no future

## 2021-01-04 NOTE — Telephone Encounter (Signed)
Done today.

## 2021-01-04 NOTE — Telephone Encounter (Signed)
Pt called in to know status of RX refill

## 2021-01-09 ENCOUNTER — Other Ambulatory Visit: Payer: Self-pay | Admitting: Family Medicine

## 2021-02-02 ENCOUNTER — Other Ambulatory Visit: Payer: Self-pay | Admitting: Family Medicine

## 2021-02-03 NOTE — Telephone Encounter (Signed)
Name of Medication: Tramadol Name of Pharmacy: [facility] or Written Date and Quantity: 01/04/21 #60 tab/ 0 refills Last Office Visit and Type: CPE on 11/24/20 Next Office Visit and Type: no future

## 2021-02-03 NOTE — Telephone Encounter (Signed)
Pt called stating that the [facility] that she get her medicine from is closed so pt want to know if she can get the medication transferred to another [facility]  [address] [place] [id number]

## 2021-02-04 MED ORDER — TRAMADOL HCL 50 MG PO TABS: ORAL_TABLET | ORAL | 0 refills | Status: DC

## 2021-02-04 NOTE — Addendum Note (Signed)
Addended by: Loura Pardon A on: 02/04/2021 06:23 PM   Modules accepted: Orders

## 2021-02-04 NOTE — Telephone Encounter (Signed)
I sent it to the [facility] on [address] - let me know if that is correct  ? If you need to cancel the other one

## 2021-02-04 NOTE — Telephone Encounter (Signed)
Pt called back to check status of request, I see this message was never routed to me or Dr. Glori Bickers.   Dr. Glori Bickers please see pt's request of sending her Tramadol to another pharmacy

## 2021-02-05 NOTE — Telephone Encounter (Signed)
Called pt's old [facility] and it says they are temporarily closed and are not accepting calls so I don't think Rx will be processed

## 2021-03-02 ENCOUNTER — Telehealth (INDEPENDENT_AMBULATORY_CARE_PROVIDER_SITE_OTHER): Payer: 59 | Admitting: Family Medicine

## 2021-03-02 ENCOUNTER — Other Ambulatory Visit (INDEPENDENT_AMBULATORY_CARE_PROVIDER_SITE_OTHER): Payer: 59 | Admitting: Family Medicine

## 2021-03-02 ENCOUNTER — Encounter: Payer: Self-pay | Admitting: Family Medicine

## 2021-03-02 ENCOUNTER — Other Ambulatory Visit: Payer: 59

## 2021-03-02 ENCOUNTER — Other Ambulatory Visit: Payer: Self-pay

## 2021-03-02 VITALS — Temp 98.0°F | Ht 62.75 in | Wt 170.0 lb

## 2021-03-02 DIAGNOSIS — J111 Influenza due to unidentified influenza virus with other respiratory manifestations: Secondary | ICD-10-CM | POA: Insufficient documentation

## 2021-03-02 DIAGNOSIS — U071 COVID-19: Secondary | ICD-10-CM | POA: Insufficient documentation

## 2021-03-02 LAB — POC INFLUENZA A&B (BINAX/QUICKVUE)
Influenza A, POC: NEGATIVE
Influenza B, POC: NEGATIVE

## 2021-03-02 LAB — POC COVID19 BINAXNOW: SARS Coronavirus 2 Ag: POSITIVE — AB

## 2021-03-02 MED ORDER — HYDROCOD POLST-CPM POLST ER 10-8 MG/5ML PO SUER: 5.0000 mL | Freq: Two times a day (BID) | ORAL | 0 refills | Status: DC | PRN

## 2021-03-02 MED ORDER — MOLNUPIRAVIR EUA 200MG CAPSULE: 4.0000 | ORAL_CAPSULE | Freq: Two times a day (BID) | ORAL | 0 refills | Status: AC

## 2021-03-02 NOTE — Assessment & Plan Note (Addendum)
ADDENDUM ==> COVID swab returned positive. Reviewed currently approved EUA treatments.  Reviewed expected course of illness, anticipated course of recovery, as well as red flags to suggest COVID pneumonia and/or to seek urgent in-person care. Reviewed latest CDC isolation/quarantine guidelines.  Encouraged fluids and rest. Reviewed further supportive care measures at home including vit C 500mg  bid, vit D 2000 IU daily, zinc 100mg  daily, tylenol PRN, pepcid 20mg  BID PRN.   Recommend:  Rx Molnupiravir course

## 2021-03-02 NOTE — Assessment & Plan Note (Signed)
Anticipate flu or flu-like viral illness.  Supportive care reviewed. Rx tussionex cough syrup which has previously been effective, with sedation precautions.  I did ask her to come this afternoon for COVID and flu swabs.  Red flags to seek urgent in person care reviewed.

## 2021-03-02 NOTE — Addendum Note (Signed)
Addended by: Ria Bush on: 03/02/2021 04:56 PM   Modules accepted: Orders

## 2021-03-02 NOTE — Progress Notes (Signed)
Ordering flu/covid swabs for recent VV

## 2021-03-02 NOTE — Progress Notes (Addendum)
Patient ID: [name], female    DOB: 1965-10-28, 55 y.o.   MRN: [id number]  Virtual visit completed through [employer], a video enabled telemedicine application. Due to national recommendations of social distancing due to COVID-19, a virtual visit is felt to be most appropriate for this patient at this time. Reviewed limitations, risks, security and privacy concerns of performing a virtual visit and the availability of in person appointments. I also reviewed that there may be a patient responsible charge related to this service. The patient agreed to proceed.   Patient location: home Provider location: [place] at [facility], office Persons participating in this virtual visit: patient, provider   If any vitals were documented, they were collected by patient at home unless specified below.    Temp 98 F (36.7 C)   Ht 5' 2.75" (1.594 m)   Wt 170 lb (77.1 kg)   LMP 11/24/2010   BMI 30.35 kg/m    CC: cough Subjective:   HPI: [name] is a 55 y.o. female presenting on 03/02/2021 for Cough (C/o cough, body aches and fever-max 100.4.  Sxs started 02/27/21. Neg home COVID test on 02/28/21.  Last COVID booster 02/2020, no flu shot. )   Takes tramadol for chronic headaches.  2 wk h/o morning headaches that were new.  4d h/o mostly dry cough, body aches, nausea with anorexia, low grade fever Tmax 100.4. Some R ear pain. PNDrainage, head congestion.   No tooth pain, ST, dyspnea or wheezing, loss of taste/smell.  Treating with allegra, alka seltzer plus day/night, tylenol cold/flu and advil for headache.   Neg home COVID test on 02/28/2021.  No asthma or smoking history.      Relevant past medical, surgical, family and social history reviewed and updated as indicated. Interim medical history since our last visit reviewed. Allergies and medications reviewed and updated. Outpatient Medications Prior to Visit  Medication Sig Dispense Refill   linaclotide (LINZESS) 145 MCG CAPS  capsule Take 1 capsule (145 mcg total) by mouth daily as needed. 90 capsule 1   montelukast (SINGULAIR) 10 MG tablet TAKE 1 TABLET(10 MG) BY MOUTH AT BEDTIME 90 tablet 2   traMADol (ULTRAM) 50 MG tablet TAKE 1 TABLET(50 MG) BY MOUTH EVERY 12 HOURS AS NEEDED FOR SEVERE PAIN 60 tablet 0   No facility-administered medications prior to visit.     Per HPI unless specifically indicated in ROS section below Review of Systems Objective:  Temp 98 F (36.7 C)   Ht 5' 2.75" (1.594 m)   Wt 170 lb (77.1 kg)   LMP 11/24/2010   BMI 30.35 kg/m   Wt Readings from Last 3 Encounters:  03/02/21 170 lb (77.1 kg)  11/24/20 176 lb 5 oz (80 kg)  10/12/20 177 lb 3.2 oz (80.4 kg)       Physical exam: Gen: alert, NAD, tired but not toxic appearing Pulm: speaks in complete sentences without increased work of breathing, intermittent cough with fits present Psych: normal mood, normal thought content      Assessment & Plan:   Problem List Items Addressed This Visit     Influenza-like illness    Anticipate flu or flu-like viral illness.  Supportive care reviewed. Rx tussionex cough syrup which has previously been effective, with sedation precautions.  I did ask her to come this afternoon for COVID and flu swabs.  Red flags to seek urgent in person care reviewed.        COVID-19 virus infection - Primary  ADDENDUM ==> COVID swab returned positive. Reviewed currently approved EUA treatments.  Reviewed expected course of illness, anticipated course of recovery, as well as red flags to suggest COVID pneumonia and/or to seek urgent in-person care. Reviewed latest CDC isolation/quarantine guidelines.  Encouraged fluids and rest. Reviewed further supportive care measures at home including vit C 500mg  bid, vit D 2000 IU daily, zinc 100mg  daily, tylenol PRN, pepcid 20mg  BID PRN.   Recommend:  Rx Molnupiravir course      Relevant Medications   molnupiravir EUA (LAGEVRIO) 200 mg CAPS capsule     Meds  ordered this encounter  Medications   chlorpheniramine-HYDROcodone (TUSSIONEX PENNKINETIC ER) 10-8 MG/5ML SUER    Sig: Take 5 mLs by mouth every 12 (twelve) hours as needed for cough (sedation precautions). Caution of sedation, do not take when working or driving    Dispense:  115 mL    Refill:  0   molnupiravir EUA (LAGEVRIO) 200 mg CAPS capsule    Sig: Take 4 capsules (800 mg total) by mouth 2 (two) times daily for 5 days.    Dispense:  40 capsule    Refill:  0    No orders of the defined types were placed in this encounter.   I discussed the assessment and treatment plan with the patient. The patient was provided an opportunity to ask questions and all were answered. The patient agreed with the plan and demonstrated an understanding of the instructions. The patient was advised to call back or seek an in-person evaluation if the symptoms worsen or if the condition fails to improve as anticipated.  Follow up plan: No follow-ups on file.  Ria Bush, MD

## 2021-03-06 ENCOUNTER — Other Ambulatory Visit: Payer: Self-pay | Admitting: Family Medicine

## 2021-03-08 ENCOUNTER — Other Ambulatory Visit: Payer: Self-pay | Admitting: Family Medicine

## 2021-03-08 MED ORDER — TRAMADOL HCL 50 MG PO TABS: ORAL_TABLET | ORAL | 0 refills | Status: DC

## 2021-03-08 NOTE — Telephone Encounter (Signed)
Name of Medication: Tramadol Name of Pharmacy: [facility] or Written Date and Quantity: 02/04/21 #60 tab/ 0 refills Last Office Visit and Type: CPE on 11/24/20 Next Office Visit and Type: no future

## 2021-04-04 ENCOUNTER — Other Ambulatory Visit: Payer: Self-pay | Admitting: Family Medicine

## 2021-04-05 ENCOUNTER — Other Ambulatory Visit: Payer: Self-pay | Admitting: Family Medicine

## 2021-04-06 MED ORDER — TRAMADOL HCL 50 MG PO TABS: ORAL_TABLET | ORAL | 0 refills | Status: DC

## 2021-04-06 NOTE — Telephone Encounter (Signed)
Name of Medication: Tramadol Name of Pharmacy: [facility] or Written Date and Quantity: 03/08/21 #60 tab/ 0 refills Last Office Visit and Type: 03/02/21 virtual acte appt (CPE on 11/24/20) Next Office Visit and Type: no future

## 2021-05-08 ENCOUNTER — Other Ambulatory Visit: Payer: Self-pay | Admitting: Family Medicine

## 2021-05-10 MED ORDER — TRAMADOL HCL 50 MG PO TABS: ORAL_TABLET | ORAL | 0 refills | Status: DC

## 2021-05-10 NOTE — Telephone Encounter (Signed)
Name of Medication: Tramadol Name of Pharmacy: [facility] or Written Date and Quantity: 04/06/21 #60 tab/ 0 refills Last Office Visit and Type: 03/02/21 virtual acte appt (CPE on 11/24/20) Next Office Visit and Type: no future

## 2021-06-02 ENCOUNTER — Other Ambulatory Visit: Payer: Self-pay | Admitting: Family Medicine

## 2021-06-02 NOTE — Telephone Encounter (Signed)
Name of Medication: Tramadol ?Name of Pharmacy: [facility] ?Last Fill or Written Date and Quantity: 05/10/21 #60 tab/ 0 refills ?Last Office Visit and Type: 03/02/21 virtual acte appt (CPE on 11/24/20) ?Next Office Visit and Type: no future ?

## 2021-06-03 NOTE — Telephone Encounter (Signed)
Patient is calling needing to speak with a nurse about the medications called. No message left. Please advise

## 2021-06-04 MED ORDER — TRAMADOL HCL 50 MG PO TABS: ORAL_TABLET | ORAL | 0 refills | Status: DC

## 2021-07-02 ENCOUNTER — Other Ambulatory Visit: Payer: Self-pay | Admitting: Family Medicine

## 2021-07-02 MED ORDER — TRAMADOL HCL 50 MG PO TABS: ORAL_TABLET | ORAL | 0 refills | Status: DC

## 2021-07-02 NOTE — Telephone Encounter (Signed)
Refill request for traMADol (ULTRAM) 50 MG tablet ? ?LOV - 03/02/21 VV w/ Dr. Darnell Level ?Next OV - not scheduled ?Last refill - 06/04/21 #60/0 ? ?

## 2021-08-01 ENCOUNTER — Other Ambulatory Visit: Payer: Self-pay | Admitting: Family Medicine

## 2021-08-02 ENCOUNTER — Other Ambulatory Visit: Payer: Self-pay | Admitting: Family Medicine

## 2021-08-02 NOTE — Telephone Encounter (Signed)
Is this okay to refill  ?  Lasted ov 11/23/21 ?Lasted filled 07/02/21 ?Next ov none  ?

## 2021-08-02 NOTE — Telephone Encounter (Signed)
Name of Medication: Tramadol ?Name of Pharmacy: [facility] ?Last Fill or Written Date and Quantity: 07/02/21 #60 tabs/ 0 refill ?Last Office Visit and Type: CPE on 11/24/20 ?Next Office Visit and Type: none scheduled  ? ?  ?

## 2021-08-31 ENCOUNTER — Other Ambulatory Visit: Payer: Self-pay | Admitting: Family Medicine

## 2021-08-31 MED ORDER — TRAMADOL HCL 50 MG PO TABS: ORAL_TABLET | ORAL | 0 refills | Status: DC

## 2021-08-31 NOTE — Telephone Encounter (Signed)
Name of Medication: Tramadol Name of Pharmacy: [facility] or Written Date and Quantity: 08/03/58 #60 tabs/ 0 refill Last Office Visit and Type: CPE on 11/24/20 Next Office Visit and Type: CPE on 11/29/21

## 2021-09-30 ENCOUNTER — Other Ambulatory Visit: Payer: Self-pay | Admitting: Family Medicine

## 2021-10-01 MED ORDER — TRAMADOL HCL 50 MG PO TABS: ORAL_TABLET | ORAL | 0 refills | Status: DC

## 2021-10-01 NOTE — Telephone Encounter (Signed)
Name of Medication: Tramadol Name of Pharmacy: [facility] or Written Date and Quantity: 08/31/21 #60 tabs/ 0 refill Last Office Visit and Type: CPE on 11/24/20 Next Office Visit and Type: CPE on 11/29/21

## 2021-10-04 ENCOUNTER — Other Ambulatory Visit: Payer: Self-pay | Admitting: Family Medicine

## 2021-10-31 ENCOUNTER — Other Ambulatory Visit: Payer: Self-pay | Admitting: Family Medicine

## 2021-11-01 MED ORDER — TRAMADOL HCL 50 MG PO TABS: ORAL_TABLET | ORAL | 0 refills | Status: DC

## 2021-11-01 NOTE — Telephone Encounter (Signed)
Last ov 11/24/20 Last filled 10/31/21 Next ov 11/29/21

## 2021-11-21 ENCOUNTER — Telehealth: Payer: Self-pay | Admitting: Family Medicine

## 2021-11-21 DIAGNOSIS — Z0000 Encounter for general adult medical examination without abnormal findings: Secondary | ICD-10-CM

## 2021-11-21 DIAGNOSIS — R7309 Other abnormal glucose: Secondary | ICD-10-CM

## 2021-11-21 DIAGNOSIS — E785 Hyperlipidemia, unspecified: Secondary | ICD-10-CM

## 2021-11-21 NOTE — Telephone Encounter (Signed)
-----   Message from Ellamae Sia sent at 11/11/2021  3:36 PM EDT ----- Regarding: Lab orders for Monday, 8.21.23 Patient is scheduled for CPX labs, please order future labs, Thanks , Karna Christmas

## 2021-11-22 ENCOUNTER — Other Ambulatory Visit (INDEPENDENT_AMBULATORY_CARE_PROVIDER_SITE_OTHER): Payer: 59

## 2021-11-22 DIAGNOSIS — E785 Hyperlipidemia, unspecified: Secondary | ICD-10-CM | POA: Diagnosis not present

## 2021-11-22 DIAGNOSIS — Z0000 Encounter for general adult medical examination without abnormal findings: Secondary | ICD-10-CM

## 2021-11-22 DIAGNOSIS — R7309 Other abnormal glucose: Secondary | ICD-10-CM

## 2021-11-22 LAB — CBC WITH DIFFERENTIAL/PLATELET
Basophils Absolute: 0.1 10*3/uL (ref 0.0–0.1)
Basophils Relative: 1 % (ref 0.0–3.0)
Eosinophils Absolute: 0.3 10*3/uL (ref 0.0–0.7)
Eosinophils Relative: 5.3 % — ABNORMAL HIGH (ref 0.0–5.0)
HCT: 38.6 % (ref 36.0–46.0)
Hemoglobin: 12.8 g/dL (ref 12.0–15.0)
Lymphocytes Relative: 42.8 % (ref 12.0–46.0)
Lymphs Abs: 2.5 10*3/uL (ref 0.7–4.0)
MCHC: 33 g/dL (ref 30.0–36.0)
MCV: 80 fl (ref 78.0–100.0)
Monocytes Absolute: 0.5 10*3/uL (ref 0.1–1.0)
Monocytes Relative: 7.8 % (ref 3.0–12.0)
Neutro Abs: 2.5 10*3/uL (ref 1.4–7.7)
Neutrophils Relative %: 43.1 % (ref 43.0–77.0)
Platelets: 338 10*3/uL (ref 150.0–400.0)
RBC: 4.83 Mil/uL (ref 3.87–5.11)
RDW: 14.6 % (ref 11.5–15.5)
WBC: 5.8 10*3/uL (ref 4.0–10.5)

## 2021-11-22 LAB — COMPREHENSIVE METABOLIC PANEL
ALT: 12 U/L (ref 0–35)
AST: 15 U/L (ref 0–37)
Albumin: 4.1 g/dL (ref 3.5–5.2)
Alkaline Phosphatase: 68 U/L (ref 39–117)
BUN: 12 mg/dL (ref 6–23)
CO2: 24 mEq/L (ref 19–32)
Calcium: 8.8 mg/dL (ref 8.4–10.5)
Chloride: 107 mEq/L (ref 96–112)
Creatinine, Ser: 0.98 mg/dL (ref 0.40–1.20)
GFR: 64.79 mL/min (ref >60.00)
Glucose, Bld: 108 mg/dL — ABNORMAL HIGH (ref 70–99)
Potassium: 4.5 mEq/L (ref 3.5–5.1)
Sodium: 140 mEq/L (ref 135–145)
Total Bilirubin: 0.5 mg/dL (ref 0.2–1.2)
Total Protein: 6.3 g/dL (ref 6.0–8.3)

## 2021-11-22 LAB — LIPID PANEL
Cholesterol: 185 mg/dL (ref 0–200)
HDL: 52.4 mg/dL (ref >39.00)
LDL Cholesterol: 114 mg/dL — ABNORMAL HIGH (ref 0–99)
NonHDL: 132.64
Total CHOL/HDL Ratio: 4
Triglycerides: 94 mg/dL (ref 0.0–149.0)
VLDL: 18.8 mg/dL (ref 0.0–40.0)

## 2021-11-22 LAB — HEMOGLOBIN A1C: Hgb A1c MFr Bld: 6.2 % (ref 4.6–6.5)

## 2021-11-22 LAB — TSH: TSH: 1.41 u[IU]/mL (ref 0.35–5.50)

## 2021-11-29 ENCOUNTER — Other Ambulatory Visit: Payer: Self-pay | Admitting: Family Medicine

## 2021-11-29 ENCOUNTER — Encounter: Payer: Self-pay | Admitting: Family Medicine

## 2021-11-29 ENCOUNTER — Ambulatory Visit (INDEPENDENT_AMBULATORY_CARE_PROVIDER_SITE_OTHER): Payer: 59 | Admitting: Family Medicine

## 2021-11-29 VITALS — BP 138/78 | HR 83 | Temp 97.3°F | Ht 63.0 in | Wt 182.1 lb

## 2021-11-29 DIAGNOSIS — E785 Hyperlipidemia, unspecified: Secondary | ICD-10-CM

## 2021-11-29 DIAGNOSIS — Z1231 Encounter for screening mammogram for malignant neoplasm of breast: Secondary | ICD-10-CM

## 2021-11-29 DIAGNOSIS — R519 Headache, unspecified: Secondary | ICD-10-CM | POA: Diagnosis not present

## 2021-11-29 DIAGNOSIS — R7309 Other abnormal glucose: Secondary | ICD-10-CM

## 2021-11-29 DIAGNOSIS — Z0000 Encounter for general adult medical examination without abnormal findings: Secondary | ICD-10-CM | POA: Diagnosis not present

## 2021-11-29 DIAGNOSIS — M542 Cervicalgia: Secondary | ICD-10-CM

## 2021-11-29 DIAGNOSIS — G8929 Other chronic pain: Secondary | ICD-10-CM

## 2021-11-29 DIAGNOSIS — K5904 Chronic idiopathic constipation: Secondary | ICD-10-CM | POA: Diagnosis not present

## 2021-11-29 MED ORDER — LINACLOTIDE 290 MCG PO CAPS: 290.0000 ug | ORAL_CAPSULE | Freq: Every day | ORAL | 1 refills | Status: AC | PRN

## 2021-11-29 NOTE — Assessment & Plan Note (Signed)
Ongoing From neck pain  Has seen neuro Takes tramadol

## 2021-11-29 NOTE — Progress Notes (Signed)
Subjective:    Patient ID: [name], female    DOB: March 05, 1966, 56 y.o.   MRN: [id number]  HPI Here for health maintenance exam and to review chronic medical problems    Wt Readings from Last 3 Encounters:  11/29/21 182 lb 2 oz (82.6 kg)  03/02/21 170 lb (77.1 kg)  11/24/20 176 lb 5 oz (80 kg)   32.26 kg/m  Doing ok  Feels fair  Working  Was able to travel a bit this summer   Exercise - not as much/ trying to get back to it  Likes cardio and [employer] History  Administered Date(s) Administered   Influenza,inj,Quad PF,6+ Mos 12/21/2012, 12/14/2015, 04/18/2017, 03/13/2018, 01/17/2019   Influenza-Unspecified 01/03/2014   Janssen (J&J) SARS-COV-2 Vaccination 06/22/2019   PFIZER(Purple Top)SARS-COV-2 Vaccination 02/24/2020   Td 07/12/2007, 03/13/2018   Health Maintenance Due  Topic Date Due   HIV Screening  Never done   Hepatitis C Screening  Never done   Zoster Vaccines- Shingrix (1 of 2) Never done   COVID-19 Vaccine (3 - Booster for Janssen series) 04/20/2020   MAMMOGRAM  10/23/2021   INFLUENZA VACCINE  11/02/2021    Shingrix:  may be interested   Flu shot : will get in the fall   Mammogram - 10/2020 , has one scheduled for 12/22/21 Self breast exam: no lumps , has had nipple d/c yet but it was nothing - gyn watches it   M aunt had breast cancer  Mom has lymphoma and doing well   Pap at gyn nl 05/2020    Colonoscopy 12/2017 with 5 y recall  Takes linzess for chronic constipation , always a struggle  It works some of the time  Some chronic abd pain   GI inc linzess to 290 mg   The tramadol actually helps constipation also (does not cause constipation like most people)   Still takes tramadol for chronic neck pain (that causes head aches)  It really helps  Does not want surg on neck  Has to go without a pillow     BP Readings from Last 3 Encounters:  11/29/21 138/78  11/24/20 128/72  10/12/20 (!) 151/83   Pulse Readings from Last  3 Encounters:  11/29/21 83  11/24/20 89  10/12/20 87   Elevated glucose Lab Results  Component Value Date   HGBA1C 6.2 11/22/2021  Down from 6.3  She has sugar in coffee daily but cut it back/less now  Sometimes sweets   Hyperlipidemia  Lab Results  Component Value Date   CHOL 185 11/22/2021   CHOL 193 11/17/2020   CHOL 184 03/05/2019   Lab Results  Component Value Date   HDL 52.40 11/22/2021   HDL 54.10 11/17/2020   HDL 41.90 03/05/2019   Lab Results  Component Value Date   LDLCALC 114 (H) 11/22/2021   LDLCALC 124 (H) 11/17/2020   LDLCALC 129 (H) 03/05/2019   Lab Results  Component Value Date   TRIG 94.0 11/22/2021   TRIG 74.0 11/17/2020   TRIG 66.0 03/05/2019   Lab Results  Component Value Date   CHOLHDL 4 11/22/2021   CHOLHDL 4 11/17/2020   CHOLHDL 4 03/05/2019   No results found for: "LDLDIRECT"  Last visit discussed cutting back on red meat   The 10-year ASCVD risk score (Arnett DK, et al., 2019) is: 4.1%   Values used to calculate the score:     Age: 26 years     Sex: Female  Is Non-Hispanic African American: Yes     Diabetic: No     Tobacco smoker: No     Systolic Blood Pressure: 161 mmHg     Is BP treated: No     HDL Cholesterol: 52.4 mg/dL     Total Cholesterol: 185 mg/dL   Lab Results  Component Value Date   CREATININE 0.98 11/22/2021   BUN 12 11/22/2021   NA 140 11/22/2021   K 4.5 11/22/2021   CL 107 11/22/2021   CO2 24 11/22/2021   Lab Results  Component Value Date   ALT 12 11/22/2021   AST 15 11/22/2021   ALKPHOS 68 11/22/2021   BILITOT 0.5 11/22/2021   Lab Results  Component Value Date   TSH 1.41 11/22/2021   Lab Results  Component Value Date   WBC 5.8 11/22/2021   HGB 12.8 11/22/2021   HCT 38.6 11/22/2021   MCV 80.0 11/22/2021   PLT 338.0 11/22/2021   Patient Active Problem List   Diagnosis Date Noted   Allergic rhinitis 11/24/2020   Elevated glucose 03/12/2019   Mild hyperlipidemia 03/13/2018   Chronic  idiopathic constipation 01/24/2018   Hernia, abdominal 12/14/2015   Chronic daily headache 05/28/2015   Chronic neck pain 03/10/2015   Family history of brain aneurysm 01/07/2013   Eczema 12/21/2012   Routine general medical examination at a health care facility 08/08/2012   GERD 08/20/2008   RENAL CALCULUS, HX OF 08/20/2008   Past Medical History:  Diagnosis Date   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 watched   GERD (gastroesophageal reflux disease)    Hiatal hernia    HPV in female    also some abnormal paps in past   PPD positive    Hx of   Renal lithiasis 2013   Sickle cell trait ([place])    Past Surgical History:  Procedure Laterality Date   APPENDECTOMY     1990's   BREAST EXCISIONAL BIOPSY Left 2014   BREAST SURGERY     removal of L breast mass / benign /age 60-20   CESAREAN SECTION     x3   CYSTOSCOPY  2008   L RPG, stone extraction ([place])   ESOPHAGOGASTRODUODENOSCOPY  06/15/2012   Several----normal    FOOT SURGERY  JULY 2013   RIGHT   LAPAROSCOPIC VAGINAL HYSTERECTOMY  12/02/2010   menorrhagia, leiomyomata   LAPAROSCOPY N/A 05/24/2012   Procedure: LAPAROSCOPY OPERATIVE;  Surgeon: Anastasio Auerbach, MD;  Location: [facility];  Service: Gynecology;  Laterality: N/A;   PELVIC LAPAROSCOPY     x2 for cysts r/o endometriosis   SALPINGOOPHORECTOMY Right 05/24/2012   Procedure: SALPINGO OOPHORECTOMY;  Surgeon: Anastasio Auerbach, MD;  Location: [facility];  Service: Gynecology;  Laterality: Right;   TONSILLECTOMY AND ADENOIDECTOMY     age 5   TUBAL LIGATION     BTL   Social History   Tobacco Use   Smoking status: Never   Smokeless tobacco: Never  Vaping Use   Vaping Use: Never used  Substance Use Topics   Alcohol use: Yes    Alcohol/week: 0.0 standard drinks of alcohol    Comment: Rare   Drug use: No   Family History  Problem Relation Age of Onset   Anesthesia problems Mother    Hypertension Mother    Cancer Mother        Lymphoma   Aneurysm Father     Prostate cancer Brother    Breast cancer Maternal Aunt 13  Cancer Paternal Aunt        uterine CA   Lupus Cousin    Colon cancer Neg Hx    Esophageal cancer Neg Hx    Stomach cancer Neg Hx    Rectal cancer Neg Hx    Allergies  Allergen Reactions   Banana Itching    Throat itches   Morphine Itching   Current Outpatient Medications on File Prior to Visit  Medication Sig Dispense Refill   montelukast (SINGULAIR) 10 MG tablet TAKE 1 TABLET(10 MG) BY MOUTH AT BEDTIME 90 tablet 2   traMADol (ULTRAM) 50 MG tablet TAKE 1 TABLET(50 MG) BY MOUTH EVERY 12 HOURS AS NEEDED FOR SEVERE PAIN 60 tablet 0   No current facility-administered medications on file prior to visit.     Review of Systems  Constitutional:  Negative for activity change, appetite change, fatigue, fever and unexpected weight change.  HENT:  Negative for congestion, ear pain, rhinorrhea, sinus pressure and sore throat.   Eyes:  Negative for pain, redness and visual disturbance.  Respiratory:  Negative for cough, shortness of breath and wheezing.   Cardiovascular:  Negative for chest pain and palpitations.       Occ feels a flutter in chest   Gastrointestinal:  Negative for abdominal pain, blood in stool, constipation and diarrhea.  Endocrine: Negative for polydipsia and polyuria.  Genitourinary:  Negative for dysuria, frequency and urgency.  Musculoskeletal:  Positive for neck pain and neck stiffness. Negative for arthralgias, back pain and myalgias.       Right knee occ feels like knee cap goes out (fleeting)   Skin:  Negative for pallor and rash.  Allergic/Immunologic: Negative for environmental allergies.  Neurological:  Negative for dizziness, syncope and headaches.  Hematological:  Negative for adenopathy. Does not bruise/bleed easily.  Psychiatric/Behavioral:  Negative for decreased concentration and dysphoric mood. The patient is not nervous/anxious.        Objective:   Physical Exam Constitutional:       General: She is not in acute distress.    Appearance: Normal appearance. She is well-developed. She is obese. She is not ill-appearing or diaphoretic.  HENT:     Head: Normocephalic and atraumatic.     Right Ear: Tympanic membrane, ear canal and external ear normal.     Left Ear: Tympanic membrane, ear canal and external ear normal.     Nose: Nose normal. No congestion.     Mouth/Throat:     Mouth: Mucous membranes are moist.     Pharynx: Oropharynx is clear. No posterior oropharyngeal erythema.  Eyes:     General: No scleral icterus.    Extraocular Movements: Extraocular movements intact.     Conjunctiva/sclera: Conjunctivae normal.     Pupils: Pupils are equal, round, and reactive to light.  Neck:     Thyroid: No thyromegaly.     Vascular: No carotid bruit or JVD.     Comments: Neck is not tender today Cardiovascular:     Rate and Rhythm: Normal rate and regular rhythm.     Pulses: Normal pulses.     Heart sounds: Normal heart sounds. No murmur heard.    No gallop.  Pulmonary:     Effort: Pulmonary effort is normal. No respiratory distress.     Breath sounds: Normal breath sounds. No wheezing.     Comments: Good air exch Chest:     Chest wall: No tenderness.  Abdominal:     General: Bowel sounds are normal. There is  no distension or abdominal bruit.     Palpations: Abdomen is soft. There is no mass.     Tenderness: There is no abdominal tenderness.     Hernia: No hernia is present.  Genitourinary:    Comments: Breast and pelvic exam done by gynecologist  Musculoskeletal:        General: No tenderness. Normal range of motion.     Cervical back: Normal range of motion and neck supple. No rigidity. No muscular tenderness.     Right lower leg: No edema.     Left lower leg: No edema.     Comments: No kyphosis   No knee swelling today    Lymphadenopathy:     Cervical: No cervical adenopathy.  Skin:    General: Skin is warm and dry.     Coloration: Skin is not pale.      Findings: No erythema or rash.     Comments: Few skin tags  Neurological:     Mental Status: She is alert. Mental status is at baseline.     Cranial Nerves: No cranial nerve deficit.     Motor: No abnormal muscle tone.     Coordination: Coordination normal.     Gait: Gait normal.     Deep Tendon Reflexes: Reflexes are normal and symmetric. Reflexes normal.  Psychiatric:        Mood and Affect: Mood normal.        Cognition and Memory: Cognition and memory normal.           Assessment & Plan:   Problem List Items Addressed This Visit       Digestive   Chronic idiopathic constipation    Per pt linzess 290 mg prn works better than 145 (the 290 came last from GI) She states tramadol does not worsen her constipation  Enc high fiber diet with lots of fluids         Other   Chronic daily headache    Ongoing From neck pain  Has seen neuro Takes tramadol      Chronic neck pain    Acute on chronic  Per pt has had injections She depends on tramadol bid (inst to use prn and minimize if possible) Plan to take pillows off bed since she is worse if she accidentally uses one  Encourage exercise and heat/ice       Elevated glucose    Lab Results  Component Value Date   HGBA1C 6.2 11/22/2021  Down from 6.3 disc imp of low glycemic diet and wt loss to prevent DM2        Mild hyperlipidemia    Disc goals for lipids and reasons to control them Rev last labs with pt Rev low sat fat diet in detail LDL is down to 114 HDL still in 50s Diet controlled  Has cut back red meat       Routine general medical examination at a health care facility - Primary    Reviewed health habits including diet and exercise and skin cancer prevention Reviewed appropriate screening tests for age  Also reviewed health mt list, fam hx and immunization status , as well as social and family history   See HPI Labs reviewed  Enc her to return to exercise  Pt plans to check coverage of  shingrix Plans fu shot in the fall  Mammogram is scheduled 9/20 colonsocopy utd 12/2017  No falls or fractures Pap utd from 05/2020 with gyn

## 2021-11-29 NOTE — Assessment & Plan Note (Signed)
Disc goals for lipids and reasons to control them Rev last labs with pt Rev low sat fat diet in detail LDL is down to 114 HDL still in 50s Diet controlled  Has cut back red meat

## 2021-11-29 NOTE — Assessment & Plan Note (Signed)
Lab Results  Component Value Date   HGBA1C 6.2 11/22/2021   Down from 6.3 disc imp of low glycemic diet and wt loss to prevent DM2

## 2021-11-29 NOTE — Patient Instructions (Addendum)
Get back to exercise when you can   Get your flu shot in the fall - sept is a good time   If you are interested in the shingles vaccine series (Shingrix), call your insurance or pharmacy to check on coverage and location it must be given.  If affordable - you can schedule it here or at your pharmacy depending on coverage   Take pillow off your bed and sleep flat if that helps her neck   For weight and general health   Try to get most of your carbohydrates from produce (with the exception of white potatoes)  Eat less bread/pasta/rice/snack foods/cereals/sweets and other items from the middle of the grocery store (processed carbs) Exercise helps also   Avoid red meat and fatty pork and shellfish for cholesterol    I sent in linzess at the 290 mg dose to take as needed   Drink lots of fluids  Get your mammogram as scheduled

## 2021-11-29 NOTE — Assessment & Plan Note (Signed)
Per pt linzess 290 mg prn works better than 145 (the 290 came last from GI) She states tramadol does not worsen her constipation  Enc high fiber diet with lots of fluids

## 2021-11-29 NOTE — Assessment & Plan Note (Signed)
Reviewed health habits including diet and exercise and skin cancer prevention Reviewed appropriate screening tests for age  Also reviewed health mt list, fam hx and immunization status , as well as social and family history   See HPI Labs reviewed  Enc her to return to exercise  Pt plans to check coverage of shingrix Plans fu shot in the fall  Mammogram is scheduled 9/20 colonsocopy utd 12/2017  No falls or fractures Pap utd from 05/2020 with gyn

## 2021-11-29 NOTE — Assessment & Plan Note (Signed)
Acute on chronic  Per pt has had injections She depends on tramadol bid (inst to use prn and minimize if possible) Plan to take pillows off bed since she is worse if she accidentally uses one  Encourage exercise and heat/ice

## 2021-12-01 ENCOUNTER — Other Ambulatory Visit: Payer: Self-pay | Admitting: Family Medicine

## 2021-12-01 MED ORDER — TRAMADOL HCL 50 MG PO TABS: ORAL_TABLET | ORAL | 0 refills | Status: DC

## 2021-12-01 NOTE — Telephone Encounter (Signed)
Refill request Tramadol Last refill 11/01/21 #60 Last office visit 11/29/21

## 2021-12-22 ENCOUNTER — Ambulatory Visit: Payer: 59

## 2021-12-30 ENCOUNTER — Other Ambulatory Visit: Payer: Self-pay | Admitting: Family Medicine

## 2021-12-31 MED ORDER — TRAMADOL HCL 50 MG PO TABS: ORAL_TABLET | ORAL | 0 refills | Status: DC

## 2021-12-31 NOTE — Telephone Encounter (Signed)
Refill request for traMADol (ULTRAM) 50 MG tablet  LOV - 11/29/21 Next OV - not scheduled Last refill - 12/01/21 #60/0

## 2022-01-28 ENCOUNTER — Other Ambulatory Visit: Payer: Self-pay | Admitting: Family Medicine

## 2022-01-28 NOTE — Telephone Encounter (Signed)
Name of Medication: Tramadol Name of Pharmacy: [place] or Written Date and Quantity: 12/31/21 #60 tab/ 0 refills Last Office Visit and Type: CPE on 11/29/21 Next Office Visit and Type: none scheduled

## 2022-02-09 ENCOUNTER — Ambulatory Visit
Admission: RE | Admit: 2022-02-09 | Discharge: 2022-02-09 | Disposition: A | Discharge status: Home / Self Care | Payer: 59 | Source: Ambulatory Visit | Attending: Family Medicine | Admitting: Family Medicine

## 2022-02-09 DIAGNOSIS — Z1231 Encounter for screening mammogram for malignant neoplasm of breast: Secondary | ICD-10-CM

## 2022-02-28 ENCOUNTER — Other Ambulatory Visit: Payer: Self-pay | Admitting: Family Medicine

## 2022-03-01 MED ORDER — TRAMADOL HCL 50 MG PO TABS: ORAL_TABLET | ORAL | 0 refills | Status: DC

## 2022-03-01 NOTE — Telephone Encounter (Signed)
Name of Medication: Tramadol Name of Pharmacy: [place] or Written Date and Quantity: 01/28/22 #60 tab/ 0 refills Last Office Visit and Type: CPE on 11/29/21 Next Office Visit and Type: none scheduled

## 2022-03-10 ENCOUNTER — Telehealth: Payer: Self-pay | Admitting: *Deleted

## 2022-03-10 NOTE — Telephone Encounter (Signed)
Patient called stating for a while she has been having right lower quad pain. Reports issues with constipation as well, she has been taking Linzess for this. 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 Patient said it hurt when sneezing, relates to a sharp stabbing pain that  goes away. I suggested patient schedule office visit for exam by provider. Patient declined reports she has annual exam scheduled on 03/30/22 and believes she can wait. I explained she can come in to address problem now, she still declined. I informed her can call if she wants to be seen sooner for problem only. Patient verbalized she understood.

## 2022-03-15 DIAGNOSIS — Z0289 Encounter for other administrative examinations: Secondary | ICD-10-CM

## 2022-03-30 ENCOUNTER — Encounter: Payer: Self-pay | Admitting: Obstetrics & Gynecology

## 2022-03-30 ENCOUNTER — Other Ambulatory Visit: Payer: Self-pay | Admitting: Family Medicine

## 2022-03-30 ENCOUNTER — Ambulatory Visit (INDEPENDENT_AMBULATORY_CARE_PROVIDER_SITE_OTHER): Payer: 59 | Admitting: Obstetrics & Gynecology

## 2022-03-30 VITALS — BP 122/80 | HR 83 | Temp 98.3°F | Ht 64.0 in | Wt 188.0 lb

## 2022-03-30 DIAGNOSIS — R300 Dysuria: Secondary | ICD-10-CM | POA: Diagnosis not present

## 2022-03-30 DIAGNOSIS — Z90710 Acquired absence of both cervix and uterus: Secondary | ICD-10-CM

## 2022-03-30 DIAGNOSIS — N898 Other specified noninflammatory disorders of vagina: Secondary | ICD-10-CM | POA: Diagnosis not present

## 2022-03-30 DIAGNOSIS — Z01419 Encounter for gynecological examination (general) (routine) without abnormal findings: Secondary | ICD-10-CM

## 2022-03-30 DIAGNOSIS — E6609 Other obesity due to excess calories: Secondary | ICD-10-CM

## 2022-03-30 MED ORDER — FLUCONAZOLE 150 MG PO TABS: 150.0000 mg | ORAL_TABLET | Freq: Once | ORAL | 0 refills | Status: AC

## 2022-03-30 NOTE — Progress Notes (Signed)
Zarin Hagmann 1965/07/24 [id number]   History:    56 y.o.  G3P2103 female    RP:  Established patient for annual gynecologic exam.     HPI:  Status post LAVH 2012 with subsequent RSO 2014. Perimenopausal.  No significant menopausal symptoms. Chronic right lower quadrant pain.  Had been evaluated several times in the past.  Most recently with October 2019 CT scan which was normal with exception of mild right renal pyelectasis.  Followed by Urology. Breasts normal.  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  She saw general surgeon who discussed excision versus observation and they both agreed to follow. Mammo 02/2022 Neg.  Last Pap Neg 05/2020.  History of abnormal Pap smears a number of years ago with normal Pap smears since. No indication to repeat a Pap at this time. Colono 01-01-18.  C/O burning with urination.  BMI 32.27.  Health labs with Fam MD.   Past medical history,surgical history, family history and social history were all reviewed and documented in the EPIC chart.  Gynecologic History Patient's last menstrual period was 11/24/2010.  Obstetric History OB History  Gravida Para Term Preterm AB Living  '3 3 2 1   2  '$ SAB IAB Ectopic Multiple Live Births          3    # Outcome Date GA Lbr Len/2nd Weight Sex Delivery Anes PTL Lv  3 Preterm     M CS-Unspec  Y LIV  2 Term     F CS-Unspec  N LIV  1 Term     M CS-Unspec  N LIV    Obstetric Comments  Son recently passed.     ROS: A ROS was performed and pertinent positives and negatives are included in the history. GENERAL: No fevers or chills. HEENT: No change in vision, no earache, sore throat or sinus congestion. NECK: No pain or stiffness. CARDIOVASCULAR: No chest pain or pressure. No palpitations. PULMONARY: No shortness of breath, cough or wheeze. GASTROINTESTINAL: No abdominal pain, nausea, vomiting or diarrhea, melena or bright red blood per rectum. GENITOURINARY: No urinary frequency, urgency,  hesitancy or dysuria. MUSCULOSKELETAL: No joint or muscle pain, no back pain, no recent trauma. DERMATOLOGIC: No rash, no itching, no lesions. ENDOCRINE: No polyuria, polydipsia, no heat or cold intolerance. No recent change in weight. HEMATOLOGICAL: No anemia or easy bruising or bleeding. NEUROLOGIC: No headache, seizures, numbness, tingling or weakness. PSYCHIATRIC: No depression, no loss of interest in normal activity or change in sleep pattern.     Exam:   BP 122/80   Pulse 83   Ht '5\' 4"'$  (1.626 m)   Wt 188 lb (85.3 kg)   LMP 11/24/2010   SpO2 96%   BMI 32.27 kg/m  Temp 98.3 Body mass index is 32.27 kg/m.  General appearance : Well developed well nourished female. No acute distress HEENT: Eyes: no retinal hemorrhage or exudates,  Neck supple, trachea midline, no carotid bruits, no thyroidmegaly Lungs: Clear to auscultation, no rhonchi or wheezes, or rib retractions  Heart: Regular rate and rhythm, no murmurs or gallops Breast:Examined in sitting and supine position were symmetrical in appearance, no palpable masses or tenderness,  no skin retraction, no nipple inversion, no nipple discharge, no skin discoloration, no axillary or supraclavicular lymphadenopathy Abdomen: no palpable masses or tenderness, no rebound or guarding Extremities: no edema or skin discoloration or tenderness  Pelvic: Vulva: Normal  Vagina: No gross lesions or discharge  Cervix/Uterus absent  Adnexa  Without masses or tenderness  Anus: Normal  U/A:  Not able to pass urine.  Will bring the specimen back tomorrow.   Assessment/Plan:  56 y.o. female for annual exam   1. Well female exam with routine gynecological exam Status post LAVH 2012 with subsequent RSO 2014. Perimenopausal.  No significant menopausal symptoms. Chronic right lower quadrant pain.  Had been evaluated several times in the past.  Most recently with October 2019 CT scan which was normal with exception of mild right renal  pyelectasis.  Followed by Urology. Breasts normal.  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  She saw general surgeon who discussed excision versus observation and they both agreed to follow. Mammo 02/2022 Neg.  Last Pap Neg 05/2020.  History of abnormal Pap smears a number of years ago with normal Pap smears since. No indication to repeat a Pap at this time. Colono 01-01-18.  C/O burning with urination.  BMI 32.27.  Health labs with Fam MD.  2. S/P laparoscopic assisted vaginal hysterectomy (LAVH) and RSO  3. Burning with urination Not able to pass urine, will bring the specimen back tomorrow. - Urinalysis,Complete w/RFL Culture  4. Vagina itching Clinical yeast vaginitis.  Fluconazole 150 mg 1 tab PO.  Prescription sent to pharmacy.  5. Class 1 obesity due to excess calories without serious comorbidity with body mass index (BMI) of 32.0 to 32.9 in adult Weight loss program through her [employer].  Other orders - fluocinonide ointment (LIDEX) 0.05 %; Apply directly to scalp 3 days per week - minoxidil (LONITEN) 2.5 MG tablet; Take 1/2 tablet daily after dinner x 3 months supply - fluconazole (DIFLUCAN) 150 MG tablet; Take 1 tablet (150 mg total) by mouth once for 1 dose.   Princess Bruins MD, 3:58 PM

## 2022-03-31 MED ORDER — TRAMADOL HCL 50 MG PO TABS: ORAL_TABLET | ORAL | 0 refills | Status: DC

## 2022-03-31 NOTE — Telephone Encounter (Signed)
Name of Medication: Tramadol Name of Pharmacy: [place] or Written Date and Quantity: 03/01/22 #60 tab/ 0 refills Last Office Visit and Type: CPE on 11/29/21 Next Office Visit and Type: none scheduled

## 2022-04-02 LAB — URINE CULTURE
MICRO NUMBER:: 14369578
SPECIMEN QUALITY:: ADEQUATE

## 2022-04-02 LAB — URINALYSIS, COMPLETE W/RFL CULTURE
Bilirubin Urine: NEGATIVE
Glucose, UA: NEGATIVE
Hgb urine dipstick: NEGATIVE
Hyaline Cast: NONE SEEN /LPF
Ketones, ur: NEGATIVE
Nitrites, Initial: NEGATIVE
Protein, ur: NEGATIVE
Specific Gravity, Urine: 1.025 (ref 1.001–1.035)
pH: 5.5 (ref 5.0–8.0)

## 2022-04-02 LAB — CULTURE INDICATED

## 2022-04-29 ENCOUNTER — Other Ambulatory Visit: Payer: Self-pay | Admitting: Family Medicine

## 2022-04-29 MED ORDER — TRAMADOL HCL 50 MG PO TABS: ORAL_TABLET | ORAL | 0 refills | Status: DC

## 2022-04-29 NOTE — Telephone Encounter (Signed)
On med as a historical entry, Last Office Visit and Type: CPE on 11/29/21

## 2022-04-29 NOTE — Telephone Encounter (Signed)
I don't think we filled this before  ? From dermatology  What for?

## 2022-04-29 NOTE — Telephone Encounter (Signed)
Name of Medication: Tramadol Name of Pharmacy: [place] or Written Date and Quantity: 03/31/22 #60 tab/ 0 refills Last Office Visit and Type: CPE on 11/29/21 Next Office Visit and Type: none scheduled

## 2022-04-29 NOTE — Telephone Encounter (Signed)
This was not from [place] ? dermatology

## 2022-05-27 ENCOUNTER — Other Ambulatory Visit: Payer: Self-pay | Admitting: Family Medicine

## 2022-05-29 ENCOUNTER — Other Ambulatory Visit: Payer: Self-pay | Admitting: Family Medicine

## 2022-05-31 ENCOUNTER — Telehealth: Payer: Self-pay | Admitting: Family Medicine

## 2022-05-31 MED ORDER — TRAMADOL HCL 50 MG PO TABS: ORAL_TABLET | ORAL | 0 refills | Status: DC

## 2022-05-31 NOTE — Telephone Encounter (Signed)
Prescription Request  05/31/2022  Is this a "Controlled Substance" medicine? No  LOV: 11/29/2021  What is the name of the medication or equipment? traMADol (ULTRAM) 50 MG tablet [id number]   Have you contacted your pharmacy to request a refill? Yes  Pt states pharmacy stated they've sent over multiple refill request  Which pharmacy would you like this sent to?  [facility] [id number] - [address], [place] [address] Phone: [phone] Fax: [phone]     Patient notified that their request is being sent to the clinical staff for review and that they should receive a response within 2 business days.   Please advise at Mobile [phone] (mobile)

## 2022-05-31 NOTE — Telephone Encounter (Signed)
Refill request has already been sent to PCP

## 2022-05-31 NOTE — Telephone Encounter (Signed)
Name of Medication: Tramadol Name of Pharmacy: [facility] [address] Last [place] or Written Date and Quantity: 04/29/22 #60 tab/ 0 refills Last Office Visit and Type: CPE on 11/29/21 Next Office Visit and Type: none schedule

## 2022-06-27 ENCOUNTER — Other Ambulatory Visit: Payer: Self-pay | Admitting: Family Medicine

## 2022-06-27 MED ORDER — TRAMADOL HCL 50 MG PO TABS: ORAL_TABLET | ORAL | 0 refills | Status: DC

## 2022-07-26 ENCOUNTER — Other Ambulatory Visit: Payer: Self-pay | Admitting: Family Medicine

## 2022-07-26 MED ORDER — TRAMADOL HCL 50 MG PO TABS: ORAL_TABLET | ORAL | 0 refills | Status: DC

## 2022-07-26 NOTE — Telephone Encounter (Signed)
Name of Medication: Tramadol Name of Pharmacy: [facility] [address] Last [place] or Written Date and Quantity: 06/27/22 #60 tab/ 0 refills  Last Office Visit and Type: CPE 11/29/21 Next Office Visit and Type: non scheduled

## 2022-07-27 ENCOUNTER — Encounter: Payer: Self-pay | Admitting: Obstetrics & Gynecology

## 2022-07-27 ENCOUNTER — Ambulatory Visit (INDEPENDENT_AMBULATORY_CARE_PROVIDER_SITE_OTHER): Payer: 59 | Admitting: Obstetrics & Gynecology

## 2022-07-27 VITALS — BP 118/78 | HR 74 | Temp 98.7°F

## 2022-07-27 DIAGNOSIS — Z90710 Acquired absence of both cervix and uterus: Secondary | ICD-10-CM | POA: Diagnosis not present

## 2022-07-27 DIAGNOSIS — R102 Pelvic and perineal pain: Secondary | ICD-10-CM

## 2022-07-27 DIAGNOSIS — N951 Menopausal and female climacteric states: Secondary | ICD-10-CM

## 2022-07-27 MED ORDER — ESTRADIOL 0.1 MG/24HR TD PTTW: 1.0000 | MEDICATED_PATCH | TRANSDERMAL | 4 refills | Status: AC

## 2022-07-27 NOTE — Progress Notes (Signed)
    Chenell Lozon Jul 12, 1965 [id number]        57 y.o.  [id number] Married  RP: Left pelvic pain intermittently  HPI:  Status post LAVH 2012 with subsequent RSO 2014. C/O severe hot flushes, would like to start on HRT.  Currently with Left lower abdominal discomfort.  Treating chronic constipation and IBS on Linzess. CT scan at the [facility] about 3 weeks ago showing large stool burden, no left ovarian cyst or mass seen.  Per patient, the left ovary was not identified.   OB History  Gravida Para Term Preterm AB Living  SAB IAB Ectopic Multiple Live Births          3    # Outcome Date GA Lbr Len/2nd Weight Sex Delivery Anes PTL Lv  3 Preterm     M CS-Unspec  Y LIV  2 Term     F CS-Unspec  N LIV  1 Term     M CS-Unspec  N LIV    Obstetric Comments  Son recently passed.    Past medical history,surgical history, problem list, medications, allergies, family history and social history were all reviewed and documented in the EPIC chart.   Directed ROS with pertinent positives and negatives documented in the history of present illness/assessment and plan.  Exam:  Vitals:   07/27/22 0822  BP: 118/78  Pulse: 74  Temp: 98.7 F (37.1 C)  TempSrc: Oral  SpO2: 99%   General appearance:  Normal  Abdomen: Normal  Gynecologic exam: Vulva normal.  Bimanual exam:  Vagina normal.  No pelvic mass felt.  Mild tenderness at left adnexa.  S/P Total Hysterectomy/RSO.   Assessment/Plan:  57 y.o. G3P2102   1. Pelvic pain Status post LAVH 2012 with subsequent RSO 2014. C/O severe hot flushes, would like to start on HRT.  Currently with Left lower abdominal discomfort.  Treating chronic constipation and IBS on Linzess. CT scan at the [facility] about 3 weeks ago showing large stool burden, no left ovarian cyst or mass seen.  Per patient, the left ovary was not identified.  - US Transvaginal Non-OB; Future  2. Menopausal hot flushes Status post LAVH 2012 with subsequent RSO 2014. C/O severe hot  flushes, would like to start on HRT. Will confirm menopause with an [facility] today.  No CI to Estrogen HRT.  Benefit/risks thoroughly reviewed. Usage discussed.  Prescription sent to pharmacy. - FSH  3. S/P laparoscopic assisted vaginal hysterectomy (LAVH) and RSO  Other orders - estradiol (VIVELLE-DOT) 0.1 MG/24HR patch; Place 1 patch (0.1 mg total) onto the skin 2 (two) times a week.   Genia Del MD, 8:42 AM 07/27/2022

## 2022-07-28 ENCOUNTER — Telehealth: Payer: Self-pay

## 2022-07-28 LAB — FOLLICLE STIMULATING HORMONE: FSH: 48.6 m[IU]/mL

## 2022-07-28 NOTE — Telephone Encounter (Signed)
Genia Del, MD  You12 minutes ago (11:42 AM)   No problem with the patch.  I hope that her "Cleanse" is healthy and will not dehydrate her. Dr L     I spoke with patient and informed her.

## 2022-07-28 NOTE — Telephone Encounter (Signed)
Patient called because she is starting with estradiol patch today.  She said she had planned to start a 14 day cleanse starting Sunday and asks if this will interfere with her patch.

## 2022-08-15 ENCOUNTER — Ambulatory Visit (INDEPENDENT_AMBULATORY_CARE_PROVIDER_SITE_OTHER): Payer: 59 | Admitting: Family Medicine

## 2022-08-15 ENCOUNTER — Encounter: Payer: Self-pay | Admitting: Family Medicine

## 2022-08-15 VITALS — BP 120/60 | HR 87 | Temp 98.1°F | Ht 63.0 in | Wt 181.1 lb

## 2022-08-15 DIAGNOSIS — J069 Acute upper respiratory infection, unspecified: Secondary | ICD-10-CM

## 2022-08-15 MED ORDER — BENZONATATE 200 MG PO CAPS: 200.0000 mg | ORAL_CAPSULE | Freq: Three times a day (TID) | ORAL | 0 refills | Status: DC | PRN

## 2022-08-15 MED ORDER — GUAIFENESIN-CODEINE 100-10 MG/5ML PO SOLN: 5.0000 mL | Freq: Four times a day (QID) | ORAL | 0 refills | Status: DC | PRN

## 2022-08-15 NOTE — Progress Notes (Unsigned)
[name], MD, CAQ [facility] at [facility] [address] [place], [address]  Phone: [phone]  FAX: [phone]  [name] - 57 y.o. female  MRN [id number]  Date of Birth: 1966-03-05  Date: 08/15/2022  PCP: Judy Pimple, MD  Referral: Judy Pimple, MD  Chief Complaint  Patient presents with   Cough    Negative Covid Test a couple of days ago   Nasal Congestion   Headache   Subjective:   [name] is a 57 y.o. very pleasant female patient with Body mass index is 32.08 kg/m. who presents with the following:  Grandson and granddaughter have been sick.  Son has also been sick for about a month.  Son has also had an earache.  She is having some significant nasal congestion.  R ear started to hurt.  Put eardrops in and it seemed to get better.  The cough is really bad.   Started about five days ago.   Some stomach pain Not much appetite No diarrhea Like a little bit of a sore throat.   No nausea or vomiting.  Review of Systems is noted in the HPI, as appropriate  Objective:   BP 120/60 (BP Location: Left Arm, Patient Position: Sitting, Cuff Size: Large)   Pulse 87   Temp 98.1 F (36.7 C) (Temporal)   Ht 5\' 3"  (1.6 m)   Wt 181 lb 2 oz (82.2 kg)   LMP 11/24/2010 Comment: sexually active  SpO2 95%   BMI 32.08 kg/m   Gen: WDWN, NAD. Globally Non-toxic HEENT: Throat clear, w/o exudate, R TM clear, L TM - good landmarks, No fluid present. rhinnorhea.  MMM Frontal sinuses: NT Max sinuses: NT NECK: Anterior cervical  LAD is absent CV: RRR, No M/G/R, cap refill <2 sec PULM: Breathing comfortably in no respiratory distress. no wheezing, crackles, rhonchi   Laboratory and Imaging Data:  Assessment and Plan:     ICD-10-CM   1. Viral URI  J06.9      Acute viral syndrome.  Supportive care.  Treat as such.  Medication Management during today's office visit: Meds ordered this encounter   Medications   guaiFENesin-codeine 100-10 MG/5ML syrup    Sig: Take 5 mLs by mouth every 6 (six) hours as needed for cough (before sleep).    Dispense:  120 mL    Refill:  0   benzonatate (TESSALON) 200 MG capsule    Sig: Take 1 capsule (200 mg total) by mouth 3 (three) times daily as needed for cough.    Dispense:  40 capsule    Refill:  0   There are no discontinued medications.  Orders placed today for conditions managed today: No orders of the defined types were placed in this encounter.   Disposition: No follow-ups on file.  Dragon [employer] One speech-to-text software was used for transcription in this dictation.  Possible transcriptional errors can occur using Animal nutritionist.   Signed,  Elpidio Galea. Aryani Daffern, MD   Outpatient Encounter Medications as of 08/15/2022  Medication Sig   benzonatate (TESSALON) 200 MG capsule Take 1 capsule (200 mg total) by mouth 3 (three) times daily as needed for cough.   estradiol (VIVELLE-DOT) 0.1 MG/24HR patch Place 1 patch (0.1 mg total) onto the skin 2 (two) times a week.   fluocinonide ointment (LIDEX) 0.05 % Apply directly to scalp 3 days per week   guaiFENesin-codeine 100-10 MG/5ML syrup Take 5 mLs by  mouth every 6 (six) hours as needed for cough (before sleep).   linaclotide (LINZESS) 290 MCG CAPS capsule Take 1 capsule (290 mcg total) by mouth daily as needed.   minoxidil (LONITEN) 2.5 MG tablet Take 1/2 tablet daily after dinner x 3 months supply   montelukast (SINGULAIR) 10 MG tablet TAKE 1 TABLET(10 MG) BY MOUTH AT BEDTIME   omeprazole (PRILOSEC) 40 MG capsule Take 40 mg by mouth daily.   traMADol (ULTRAM) 50 MG tablet TAKE 1 TABLET(50 MG) BY MOUTH EVERY 12 HOURS AS NEEDED FOR SEVERE PAIN   No facility-administered encounter medications on file as of 08/15/2022.

## 2022-08-25 ENCOUNTER — Other Ambulatory Visit: Payer: Self-pay | Admitting: Family Medicine

## 2022-08-25 MED ORDER — TRAMADOL HCL 50 MG PO TABS: ORAL_TABLET | ORAL | 0 refills | Status: DC

## 2022-08-25 NOTE — Telephone Encounter (Signed)
Name of Medication: Tramadol Name of Pharmacy: [facility] [address] Last [place] or Written Date and Quantity: 07/26/22 #60 tab/ 0 refills  Last Office Visit and Type: acute with Dr. Patsy Lager on 08/20/22 (CPE 11/29/21) Next Office Visit and Type: non scheduled

## 2022-09-01 ENCOUNTER — Ambulatory Visit (INDEPENDENT_AMBULATORY_CARE_PROVIDER_SITE_OTHER): Payer: 59 | Admitting: Obstetrics & Gynecology

## 2022-09-01 ENCOUNTER — Ambulatory Visit (INDEPENDENT_AMBULATORY_CARE_PROVIDER_SITE_OTHER): Payer: 59

## 2022-09-01 ENCOUNTER — Encounter: Payer: Self-pay | Admitting: Obstetrics & Gynecology

## 2022-09-01 VITALS — BP 110/74 | HR 72

## 2022-09-01 DIAGNOSIS — R102 Pelvic and perineal pain: Secondary | ICD-10-CM

## 2022-09-01 NOTE — Progress Notes (Signed)
    [name] 28-May-1965 [id number]        57 y.o.  [id number]   RP: Lt pelvic pain intermittently  HPI:  Status post LAVH 2012 with subsequent RSO 2014. Currently with Left lower abdominal discomfort intermittently.  Treating chronic constipation and IBS on Linzess. CT scan at the [place] in early 07/2022 showing large stool burden, no left ovarian cyst or mass seen.  Per patient, the left ovary was not identified.    OB History  Gravida Para Term Preterm AB Living  3 3 2 1   2   SAB IAB Ectopic Multiple Live Births          3    # Outcome Date GA Lbr Len/2nd Weight Sex Delivery Anes PTL Lv  3 Preterm     M CS-Unspec  Y LIV  2 Term     F CS-Unspec  N LIV  1 Term     M CS-Unspec  N LIV    Obstetric Comments  Son recently passed.    Past medical history,surgical history, problem list, medications, allergies, family history and social history were all reviewed and documented in the EPIC chart.   Directed ROS with pertinent positives and negatives documented in the history of present illness/assessment and plan.  Exam:  Vitals:   09/01/22 1642  BP: 110/74  Pulse: 72  SpO2: 99%   General appearance:  Normal  Pelvic US today: T/V images.  The uterus is surgically absent.  Vaginal cuff normal.  Right ovary is surgically absent.  Left ovary is normal in size with normal follicular pattern.  2 simple follicles are present on the left ovary measured at 1.6 and 1.5 cm.  Normal perfusion to the left ovary.  No pelvic mass or free fluid seen transabdominally or transvaginally.   Assessment/Plan:  57 y.o. G3P2102   1. Lt Pelvic pain intermittently Status post LAVH 2012 with subsequent RSO 2014. Currently with Left lower abdominal discomfort intermittently.  Treating chronic constipation and IBS on Linzess. CT scan at the [place] in early 07/2022 showing large stool burden, no left ovarian cyst or mass seen.  Per patient, the left ovary was not identified.  Left ovary is normal in size with  normal follicular pattern.  2 simple follicles are present on the left ovary measured at 1.6 and 1.5 cm.  Normal perfusion to the left ovary.  No pelvic mass or free fluid seen transabdominally or transvaginally.  Pelvic US findings reviewed with patient.  Patient reassured.  Other orders - hydrochlorothiazide (MICROZIDE) 12.5 MG capsule; TAKE ONE CAPSULE BY MOUTH DAILY FOR FLUID   Genia Del MD, 4:48 PM 09/01/2022

## 2022-09-26 ENCOUNTER — Other Ambulatory Visit: Payer: Self-pay | Admitting: Family Medicine

## 2022-09-27 MED ORDER — TRAMADOL HCL 50 MG PO TABS: ORAL_TABLET | ORAL | 0 refills | Status: DC

## 2022-09-27 NOTE — Telephone Encounter (Signed)
RF request for Lidex LOV: 08/15/22, acute Dr.Copland Next ov: N/A Last written: historical provider 01/04/22  RF request for Minoxidil LOV: 08/15/22, acute Next ov: N/A Last written: historical provider 01/04/22  Requesting: Tramadol Contract: N/A UDS: N/A Last Visit: 08/15/22, acute Next Visit: N/A Last Refill: 08/25/22 (60,0)  Please Advise. Meds pending

## 2022-09-27 NOTE — Telephone Encounter (Signed)
Duplicate request, rx pending approval by provider

## 2022-09-27 NOTE — Telephone Encounter (Signed)
I only prescribe the tramadol  I removed the other meds from the refill que because they are from another provider

## 2022-10-25 ENCOUNTER — Other Ambulatory Visit: Payer: Self-pay | Admitting: Family Medicine

## 2022-10-25 MED ORDER — TRAMADOL HCL 50 MG PO TABS: ORAL_TABLET | ORAL | 0 refills | Status: DC

## 2022-10-25 NOTE — Telephone Encounter (Signed)
Please schedule annual exam for sept with fasting labs prior

## 2022-10-25 NOTE — Telephone Encounter (Signed)
Name of Medication: Tramadol Name of Pharmacy: [facility] Last [place] or Written Date and Quantity: 09/27/22 60 tabs/ 0 refill Last Office Visit and Type: cough 08/15/22 (CPE 11/29/21) Next Office Visit and Type: none scheduled

## 2022-10-25 NOTE — Telephone Encounter (Signed)
Patient has been scheduled

## 2022-11-02 ENCOUNTER — Encounter (INDEPENDENT_AMBULATORY_CARE_PROVIDER_SITE_OTHER): Payer: Self-pay

## 2022-11-22 ENCOUNTER — Telehealth: Payer: Self-pay | Admitting: Family Medicine

## 2022-11-22 DIAGNOSIS — R7309 Other abnormal glucose: Secondary | ICD-10-CM

## 2022-11-22 DIAGNOSIS — Z79899 Other long term (current) drug therapy: Secondary | ICD-10-CM | POA: Insufficient documentation

## 2022-11-22 DIAGNOSIS — Z0000 Encounter for general adult medical examination without abnormal findings: Secondary | ICD-10-CM

## 2022-11-22 DIAGNOSIS — E785 Hyperlipidemia, unspecified: Secondary | ICD-10-CM

## 2022-11-22 NOTE — Telephone Encounter (Signed)
-----   Message from Alvina Chou sent at 11/08/2022  4:00 PM EDT ----- Regarding: Lab orders for Friday, 8.23.24 Patient is scheduled for CPX labs, please order future labs, Thanks , Camelia Eng

## 2022-11-23 ENCOUNTER — Other Ambulatory Visit: Payer: Self-pay | Admitting: Family Medicine

## 2022-11-24 MED ORDER — TRAMADOL HCL 50 MG PO TABS: ORAL_TABLET | ORAL | 0 refills | Status: DC

## 2022-11-24 NOTE — Telephone Encounter (Signed)
Name of Medication: Tramadol Name of Pharmacy: [facility] Last [place] or Written Date and Quantity: 10/25/22 60 tabs/ 0 refill Last Office Visit and Type: cough 08/15/22  Next Office Visit and Type: CPE 12/02/22

## 2022-11-25 ENCOUNTER — Other Ambulatory Visit (INDEPENDENT_AMBULATORY_CARE_PROVIDER_SITE_OTHER): Payer: 59

## 2022-11-25 DIAGNOSIS — Z79899 Other long term (current) drug therapy: Secondary | ICD-10-CM | POA: Diagnosis not present

## 2022-11-25 DIAGNOSIS — Z0000 Encounter for general adult medical examination without abnormal findings: Secondary | ICD-10-CM | POA: Diagnosis not present

## 2022-11-25 DIAGNOSIS — R7309 Other abnormal glucose: Secondary | ICD-10-CM | POA: Diagnosis not present

## 2022-11-25 DIAGNOSIS — E785 Hyperlipidemia, unspecified: Secondary | ICD-10-CM | POA: Diagnosis not present

## 2022-11-25 LAB — LIPID PANEL
Cholesterol: 188 mg/dL (ref 0–200)
HDL: 54.4 mg/dL (ref >39.00)
LDL Cholesterol: 117 mg/dL — ABNORMAL HIGH (ref 0–99)
NonHDL: 133.68
Total CHOL/HDL Ratio: 3
Triglycerides: 84 mg/dL (ref 0.0–149.0)
VLDL: 16.8 mg/dL (ref 0.0–40.0)

## 2022-11-25 LAB — CBC WITH DIFFERENTIAL/PLATELET
Basophils Absolute: 0.1 10*3/uL (ref 0.0–0.1)
Basophils Relative: 0.7 % (ref 0.0–3.0)
Eosinophils Absolute: 0.5 10*3/uL (ref 0.0–0.7)
Eosinophils Relative: 6.1 % — ABNORMAL HIGH (ref 0.0–5.0)
HCT: 37 % (ref 36.0–46.0)
Hemoglobin: 12.1 g/dL (ref 12.0–15.0)
Lymphocytes Relative: 44 % (ref 12.0–46.0)
Lymphs Abs: 3.5 10*3/uL (ref 0.7–4.0)
MCHC: 32.7 g/dL (ref 30.0–36.0)
MCV: 79.6 fl (ref 78.0–100.0)
Monocytes Absolute: 0.6 10*3/uL (ref 0.1–1.0)
Monocytes Relative: 7.9 % (ref 3.0–12.0)
Neutro Abs: 3.3 10*3/uL (ref 1.4–7.7)
Neutrophils Relative %: 41.3 % — ABNORMAL LOW (ref 43.0–77.0)
Platelets: 367 10*3/uL (ref 150.0–400.0)
RBC: 4.65 Mil/uL (ref 3.87–5.11)
RDW: 14.7 % (ref 11.5–15.5)
WBC: 8 10*3/uL (ref 4.0–10.5)

## 2022-11-25 LAB — COMPREHENSIVE METABOLIC PANEL
ALT: 10 U/L (ref 0–35)
AST: 12 U/L (ref 0–37)
Albumin: 4 g/dL (ref 3.5–5.2)
Alkaline Phosphatase: 62 U/L (ref 39–117)
BUN: 8 mg/dL (ref 6–23)
CO2: 27 mEq/L (ref 19–32)
Calcium: 8.8 mg/dL (ref 8.4–10.5)
Chloride: 106 mEq/L (ref 96–112)
Creatinine, Ser: 0.99 mg/dL (ref 0.40–1.20)
GFR: 63.55 mL/min (ref >60.00)
Glucose, Bld: 100 mg/dL — ABNORMAL HIGH (ref 70–99)
Potassium: 4 mEq/L (ref 3.5–5.1)
Sodium: 141 mEq/L (ref 135–145)
Total Bilirubin: 0.7 mg/dL (ref 0.2–1.2)
Total Protein: 6.2 g/dL (ref 6.0–8.3)

## 2022-11-25 LAB — VITAMIN D 25 HYDROXY (VIT D DEFICIENCY, FRACTURES): VITD: 9.1 ng/mL — ABNORMAL LOW (ref 30.00–100.00)

## 2022-11-25 LAB — HEMOGLOBIN A1C: Hgb A1c MFr Bld: 6.1 % (ref 4.6–6.5)

## 2022-11-25 LAB — VITAMIN B12: Vitamin B-12: 279 pg/mL (ref 211–911)

## 2022-11-25 LAB — TSH: TSH: 1.35 u[IU]/mL (ref 0.35–5.50)

## 2022-12-02 ENCOUNTER — Ambulatory Visit (INDEPENDENT_AMBULATORY_CARE_PROVIDER_SITE_OTHER): Payer: 59 | Admitting: Family Medicine

## 2022-12-02 ENCOUNTER — Encounter: Payer: Self-pay | Admitting: Family Medicine

## 2022-12-02 VITALS — BP 134/78 | HR 77 | Temp 97.8°F | Ht 62.75 in | Wt 175.1 lb

## 2022-12-02 DIAGNOSIS — M542 Cervicalgia: Secondary | ICD-10-CM

## 2022-12-02 DIAGNOSIS — Z0000 Encounter for general adult medical examination without abnormal findings: Secondary | ICD-10-CM

## 2022-12-02 DIAGNOSIS — D1721 Benign lipomatous neoplasm of skin and subcutaneous tissue of right arm: Secondary | ICD-10-CM

## 2022-12-02 DIAGNOSIS — R7303 Prediabetes: Secondary | ICD-10-CM

## 2022-12-02 DIAGNOSIS — Z23 Encounter for immunization: Secondary | ICD-10-CM

## 2022-12-02 DIAGNOSIS — Z1211 Encounter for screening for malignant neoplasm of colon: Secondary | ICD-10-CM

## 2022-12-02 DIAGNOSIS — D179 Benign lipomatous neoplasm, unspecified: Secondary | ICD-10-CM | POA: Insufficient documentation

## 2022-12-02 DIAGNOSIS — Z79899 Other long term (current) drug therapy: Secondary | ICD-10-CM | POA: Diagnosis not present

## 2022-12-02 DIAGNOSIS — E785 Hyperlipidemia, unspecified: Secondary | ICD-10-CM

## 2022-12-02 DIAGNOSIS — L659 Nonscarring hair loss, unspecified: Secondary | ICD-10-CM

## 2022-12-02 DIAGNOSIS — J301 Allergic rhinitis due to pollen: Secondary | ICD-10-CM

## 2022-12-02 DIAGNOSIS — G8929 Other chronic pain: Secondary | ICD-10-CM

## 2022-12-02 DIAGNOSIS — E559 Vitamin D deficiency, unspecified: Secondary | ICD-10-CM

## 2022-12-02 NOTE — Patient Instructions (Addendum)
If you are interested in the shingles vaccine series (Shingrix), call your insurance or pharmacy to check on coverage and location it must be given.  If affordable - you can schedule it here or at your pharmacy depending on coverage    Flu shot today   Don't forget to schedule your mammogram for [facility] at the [facility]    Call to schedule your colonoscopy  (due in sept)  [facility]  [phone]    Continue the vitamin D3 at 5000 international units daily  Make sure you get calcium in diet    Try to get most of your carbohydrates from produce (with the exception of white potatoes) and whole grains Eat less bread/pasta/rice/snack foods/cereals/sweets and other items from the middle of the grocery store (processed carbs)  For cholesterol-try and minimize red meat if you can  Avoid red meat/ fried foods/ egg yolks/ fatty breakfast meats/ butter, cheese and high fat dairy/ and shellfish   I put the referral in general surgeon for lipomas  Please let us know if you don't hear in 1-2 weeks

## 2022-12-02 NOTE — Assessment & Plan Note (Signed)
Last vitamin D Lab Results  Component Value Date   VD25OH 9.10 (L) 11/25/2022   Started 5000 international units D3 daily over the counter Discussed importance to bone and overall health

## 2022-12-02 NOTE — Assessment & Plan Note (Signed)
Disc goals for lipids and reasons to control them Rev last labs with pt Rev low sat fat diet in detail  Fairly stable Encouraged to cut back on red meat

## 2022-12-02 NOTE — Assessment & Plan Note (Signed)
Ref done for 5 y recall colonoscopy  Pt will call to schedule

## 2022-12-02 NOTE — Assessment & Plan Note (Signed)
Right shoulder  Bilat axillae   Ref to gen surg for eval and possible removal Pt is very bothered by them

## 2022-12-02 NOTE — Progress Notes (Signed)
Subjective:    Patient ID: [name], female    DOB: Dec 10, 1965, 57 y.o.   MRN: [id number]  HPI  Here for health maintenance exam and to review chronic medical problems   Wt Readings from Last 3 Encounters:  12/02/22 175 lb 2 oz (79.4 kg)  08/15/22 181 lb 2 oz (82.2 kg)  03/30/22 188 lb (85.3 kg)   31.27 kg/m  Vitals:   12/02/22 1118  BP: 134/78  Pulse: 77  Temp: 97.8 F (36.6 C)  SpO2: 97%    Immunization History  Administered Date(s) Administered   Influenza, Seasonal, Injecte, Preservative Fre 12/02/2022   Influenza,inj,Quad PF,6+ Mos 12/21/2012, 12/14/2015, 04/18/2017, 03/13/2018, 01/17/2019   Influenza-Unspecified 01/03/2014   Janssen (J&J) SARS-COV-2 Vaccination 06/22/2019   PFIZER(Purple Top)SARS-COV-2 Vaccination 02/24/2020   Td 07/12/2007, 03/13/2018    Health Maintenance Due  Topic Date Due   HIV Screening  Never done   Hepatitis C Screening  Never done   Zoster Vaccines- Shingrix (1 of 2) Never done   Colonoscopy  01/02/2023   Feeling ok overall   Flu shot - today   Shingrix :  may consider later   Mammogram [facility]/2022 at the [facility]  Self breast exam: no changes or lumps   Gyn health Pap 05/2020 normal at gyn  Past tubal ligation  HRT- vivelle dot 0.1 mg - has a hard time keeping it on -is in perimenopause    Colon cancer screening -colonoscopy 12/2017- with 5 y recall / due soon  Has not scheduled it  Referral done   Bone health   Falls- none  Fractures-none  Supplements - got vitamin D3 5000 international units  Low D level Last vitamin D Lab Results  Component Value Date   VD25OH 9.10 (L) 11/25/2022    Exercise : 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, systems  Gets her muscle mass checked    Mood    12/02/2022   12:13 PM 08/15/2022    3:50 PM 11/24/2020    4:48 PM 03/12/2019    9:51 AM 03/13/2018    2:40 PM  Depression screen PHQ 2/9  Decreased Interest 0 0 0 0 0  Down, Depressed, Hopeless 0 0 0 0 0  PHQ  - 2 Score 0 0 0 0 0  Altered sleeping 2   0   Tired, decreased energy 0   0   Change in appetite 3   3   Feeling bad or failure about yourself  0   0   Trouble concentrating 0   0   Moving slowly or fidgety/restless 0   0   Suicidal thoughts 0   0   PHQ-9 Score 5   3   Difficult doing work/chores Not difficult at all   Not difficult at all     Chronic neck pain  Takes tramadol - cannot get by without it at all  Has to avoid certain exercises in gym (cause headache)  Uses peppermint    GERD Takes omep 40 mg prn - usually about once per week  Has to watch her diet (esp citrus and milk)   Chronic constipation Takes linzess -as needed  Drinks lots of water  Lab Results  Component Value Date   VITAMINB12 279 11/25/2022    Last vitamin D Lab Results  Component Value Date   VD25OH 9.10 (L) 11/25/2022   Prediabetes Lab Results  Component Value Date   HGBA1C 6.1 11/25/2022   Is  watching sugar and carbs in diet  Cut back on the sugar in her coffee   This has improved    Hyperlipidemia Lab Results  Component Value Date   CHOL 188 11/25/2022   CHOL 185 11/22/2021   CHOL 193 11/17/2020   Lab Results  Component Value Date   HDL 54.40 11/25/2022   HDL 52.40 11/22/2021   HDL 54.10 11/17/2020   Lab Results  Component Value Date   LDLCALC 117 (H) 11/25/2022   LDLCALC 114 (H) 11/22/2021   LDLCALC 124 (H) 11/17/2020   Lab Results  Component Value Date   TRIG 84.0 11/25/2022   TRIG 94.0 11/22/2021   TRIG 74.0 11/17/2020   Lab Results  Component Value Date   CHOLHDL 3 11/25/2022   CHOLHDL 4 11/22/2021   CHOLHDL 4 11/17/2020   No results found for: "LDLDIRECT"  Diet is fair  Seldom fried foods  Really loves steak     Patient Active Problem List   Diagnosis Date Noted   Vitamin D deficiency 12/02/2022   Alopecia 12/02/2022   Colon cancer screening 12/02/2022   Lipoma 12/02/2022   Current use of proton pump inhibitor 11/22/2022   Allergic rhinitis  11/24/2020   Prediabetes 03/12/2019   Mild hyperlipidemia 03/13/2018   Chronic idiopathic constipation 01/24/2018   Hernia, abdominal 12/14/2015   Chronic daily headache 05/28/2015   Chronic neck pain 03/10/2015   Family history of brain aneurysm 01/07/2013   Eczema 12/21/2012   Routine general medical examination at a health care facility 08/08/2012   GERD 08/20/2008   RENAL CALCULUS, HX OF 08/20/2008   Past Medical History:  Diagnosis Date   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 - being watched   GERD (gastroesophageal reflux disease)    Hiatal hernia    HPV in female    also some abnormal paps in past   IBS (irritable bowel syndrome)    Kidney stones    PPD positive    Hx of   Renal lithiasis 2013   Sickle cell trait (HCC)    Past Surgical History:  Procedure Laterality Date   APPENDECTOMY     1990's   BREAST EXCISIONAL BIOPSY Left 2014   BREAST SURGERY     removal of L breast mass / benign /age [age]   CESAREAN SECTION     x3   CYSTOSCOPY  2008   L RPG, stone extraction (Grapey)   ESOPHAGOGASTRODUODENOSCOPY  06/15/2012   Several----normal    FOOT SURGERY  10/2011   RIGHT   hysterectomy     2012   LAPAROSCOPIC VAGINAL HYSTERECTOMY  12/02/2010   menorrhagia, leiomyomata   LAPAROSCOPY N/A 05/24/2012   Procedure: LAPAROSCOPY OPERATIVE;  Surgeon: Dara Lords, MD;  Location: [facility];  Service: Gynecology;  Laterality: N/A;   PELVIC LAPAROSCOPY     x2 for cysts r/o endometriosis   SALPINGOOPHORECTOMY Right 05/24/2012   Procedure: SALPINGO OOPHORECTOMY;  Surgeon: Dara Lords, MD;  Location: [facility];  Service: Gynecology;  Laterality: Right;   TONSILLECTOMY AND ADENOIDECTOMY     age 21   TUBAL LIGATION     BTL   Social History   Tobacco Use   Smoking status: Never   Smokeless tobacco: Never  Vaping Use   Vaping status: Never Used  Substance Use Topics   Alcohol use: Not Currently   Drug use: No   Family History  Problem Relation Age of Onset    Anesthesia problems Mother    Hypertension  Mother    Cancer Mother        Lymphoma   Aneurysm Father    Prostate cancer Brother    Breast cancer Maternal Aunt 56   Cancer Paternal Aunt        uterine CA   Lupus Cousin    Colon cancer Neg Hx    Esophageal cancer Neg Hx    Stomach cancer Neg Hx    Rectal cancer Neg Hx    Allergies  Allergen Reactions   Banana Itching    Throat itches   Morphine Itching   Shellfish Allergy Itching   Sumatriptan Other (See Comments)   Current Outpatient Medications on File Prior to Visit  Medication Sig Dispense Refill   estradiol (VIVELLE-DOT) 0.1 MG/24HR patch Place 1 patch (0.1 mg total) onto the skin 2 (two) times a week. 24 patch 4   fluocinonide ointment (LIDEX) 0.05 % Apply directly to scalp 3 days per week     linaclotide (LINZESS) 290 MCG CAPS capsule Take 1 capsule (290 mcg total) by mouth daily as needed. 90 capsule 1   minoxidil (LONITEN) 2.5 MG tablet Take 1/2 tablet daily after dinner x 3 months supply     montelukast (SINGULAIR) 10 MG tablet TAKE 1 TABLET(10 MG) BY MOUTH AT BEDTIME 90 tablet 2   omeprazole (PRILOSEC) 40 MG capsule Take 40 mg by mouth daily as needed.     traMADol (ULTRAM) 50 MG tablet TAKE 1 TABLET(50 MG) BY MOUTH EVERY 12 HOURS AS NEEDED FOR SEVERE PAIN 60 tablet 0   No current facility-administered medications on file prior to visit.    Review of Systems  Constitutional:  Negative for activity change, appetite change, fatigue, fever and unexpected weight change.  HENT:  Negative for congestion, ear pain, rhinorrhea, sinus pressure and sore throat.   Eyes:  Negative for pain, redness and visual disturbance.  Respiratory:  Negative for cough, shortness of breath and wheezing.   Cardiovascular:  Negative for chest pain and palpitations.  Gastrointestinal:  Negative for abdominal pain, blood in stool, constipation and diarrhea.  Endocrine: Negative for polydipsia and polyuria.  Genitourinary:  Negative for  dysuria, frequency and urgency.  Musculoskeletal:  Positive for neck pain. Negative for arthralgias, back pain and myalgias.       Neck pain   Lipomas   Skin:  Negative for pallor and rash.  Allergic/Immunologic: Negative for environmental allergies.  Neurological:  Negative for dizziness, syncope and headaches.  Hematological:  Negative for adenopathy. Does not bruise/bleed easily.  Psychiatric/Behavioral:  Negative for decreased concentration and dysphoric mood. The patient is not nervous/anxious.        Objective:   Physical Exam Constitutional:      General: She is not in acute distress.    Appearance: Normal appearance. She is well-developed. She is obese. She is not ill-appearing or diaphoretic.  HENT:     Head: Normocephalic and atraumatic.     Right Ear: Tympanic membrane, ear canal and external ear normal.     Left Ear: Tympanic membrane, ear canal and external ear normal.     Nose: Nose normal. No congestion.     Mouth/Throat:     Mouth: Mucous membranes are moist.     Pharynx: Oropharynx is clear. No posterior oropharyngeal erythema.  Eyes:     General: No scleral icterus.    Extraocular Movements: Extraocular movements intact.     Conjunctiva/sclera: Conjunctivae normal.     Pupils: Pupils are equal, round, and reactive to  light.  Neck:     Thyroid: No thyromegaly.     Vascular: No carotid bruit or JVD.  Cardiovascular:     Rate and Rhythm: Normal rate and regular rhythm.     Pulses: Normal pulses.     Heart sounds: Normal heart sounds.     No gallop.  Pulmonary:     Effort: Pulmonary effort is normal. No respiratory distress.     Breath sounds: Normal breath sounds. No wheezing.     Comments: Good air exch Chest:     Chest wall: No tenderness.  Abdominal:     General: Bowel sounds are normal. There is no distension or abdominal bruit.     Palpations: Abdomen is soft. There is no mass.     Tenderness: There is no abdominal tenderness.     Hernia: No hernia  is present.  Genitourinary:    Comments: Breast and pelvic exam are done by gyn provider    Lipomas noted in both axillae    Musculoskeletal:        General: No tenderness. Normal range of motion.     Cervical back: Normal range of motion and neck supple. No rigidity. No muscular tenderness.     Right lower leg: No edema.     Left lower leg: No edema.     Comments: No kyphosis   Lymphadenopathy:     Cervical: No cervical adenopathy.  Skin:    General: Skin is warm and dry.     Coloration: Skin is not pale.     Findings: No erythema or rash.     Comments: Large lipoma on right post shoulder  Also both axillae -smaller   Neurological:     Mental Status: She is alert. Mental status is at baseline.     Cranial Nerves: No cranial nerve deficit.     Motor: No abnormal muscle tone.     Coordination: Coordination normal.     Gait: Gait normal.     Deep Tendon Reflexes: Reflexes are normal and symmetric. Reflexes normal.  Psychiatric:        Mood and Affect: Mood normal.        Cognition and Memory: Cognition and memory normal.           Assessment & Plan:   Problem List Items Addressed This Visit       Respiratory   Allergic rhinitis    Continues singulair         Musculoskeletal and Integument   Alopecia    Conitnues derm care Taking minoxidil oral         Other   Chronic neck pain    With degenerative change Sees a trainer Still requires tramadol bid unfortunately   Discussed risk of sedation/habit       Colon cancer screening    Ref done for 5 y recall colonoscopy  Pt will call to schedule       Relevant Orders   Ambulatory referral to Gastroenterology   Current use of proton pump inhibitor    Vit D lo w B12 low normal       Lipoma    Right shoulder  Bilat axillae   Ref to gen surg for eval and possible removal Pt is very bothered by them      Relevant Orders   Ambulatory referral to General Surgery   Mild hyperlipidemia    Disc  goals for lipids and reasons to control them Rev last labs with pt Rev  low sat fat diet in detail  Fairly stable Encouraged to cut back on red meat      Prediabetes    Lab Results  Component Value Date   HGBA1C 6.1 11/25/2022   disc imp of low glycemic diet and wt loss to prevent DM2  This has improved       Routine general medical examination at a health care facility - Primary    Reviewed health habits including diet and exercise and skin cancer prevention Reviewed appropriate screening tests for age  Also reviewed health mt list, fam hx and immunization status , as well as social and family history   See HPI Labs reviewed and ordered  Flu shot given Will consider shingrix in future  Mammogram due nov Gyn care utd  Colonoscopy ref done for 5 y recall  Discussed bone  health and need for vit D Good exercise  PHQ last 0       Vitamin D deficiency    Last vitamin D Lab Results  Component Value Date   VD25OH 9.10 (L) 11/25/2022   Started 5000 international units D3 daily over the counter Discussed importance to bone and overall health      Other Visit Diagnoses     Need for influenza vaccination       Relevant Orders   Flu vaccine trivalent PF, 6mos and older(Flulaval,Afluria,Fluarix,Fluzone) (Completed)

## 2022-12-02 NOTE — Assessment & Plan Note (Signed)
Vit D lo w B12 low normal

## 2022-12-02 NOTE — Assessment & Plan Note (Signed)
Conitnues derm care Taking minoxidil oral

## 2022-12-02 NOTE — Assessment & Plan Note (Signed)
Reviewed health habits including diet and exercise and skin cancer prevention Reviewed appropriate screening tests for age  Also reviewed health mt list, fam hx and immunization status , as well as social and family history   See HPI Labs reviewed and ordered  Flu shot given Will consider shingrix in future  Mammogram due nov Gyn care utd  Colonoscopy ref done for 5 y recall  Discussed bone  health and need for vit D Good exercise  PHQ last 0

## 2022-12-02 NOTE — Assessment & Plan Note (Signed)
Continues singulair

## 2022-12-02 NOTE — Assessment & Plan Note (Signed)
Lab Results  Component Value Date   HGBA1C 6.1 11/25/2022   disc imp of low glycemic diet and wt loss to prevent DM2  This has improved

## 2022-12-02 NOTE — Assessment & Plan Note (Signed)
With degenerative change Sees a trainer Still requires tramadol bid unfortunately   Discussed risk of sedation/habit

## 2022-12-07 ENCOUNTER — Encounter: Payer: Self-pay | Admitting: *Deleted

## 2022-12-23 ENCOUNTER — Other Ambulatory Visit: Payer: Self-pay | Admitting: Family Medicine

## 2022-12-26 ENCOUNTER — Other Ambulatory Visit: Payer: Self-pay | Admitting: Family Medicine

## 2022-12-26 MED ORDER — TRAMADOL HCL 50 MG PO TABS: ORAL_TABLET | ORAL | 0 refills | Status: DC

## 2022-12-26 NOTE — Telephone Encounter (Signed)
Name of Medication: Tramadol Name of Pharmacy: [facility] Last [place] or Written Date and Quantity: 11/24/22 60 tabs/ 0 refill Last Office Visit and Type:  CPE 12/02/22 Next Office Visit and Type: none scheduled

## 2023-01-23 ENCOUNTER — Other Ambulatory Visit: Payer: Self-pay | Admitting: Family Medicine

## 2023-01-23 NOTE — Telephone Encounter (Signed)
Refill request for TRAMADOL 50MG  TABLETS   LOV - 12/02/22 Next OV - not scheduled Last refill - 12/26/22 #60/0

## 2023-01-23 NOTE — Telephone Encounter (Signed)
Already done

## 2023-02-03 DIAGNOSIS — Z90710 Acquired absence of both cervix and uterus: Secondary | ICD-10-CM | POA: Insufficient documentation

## 2023-02-03 DIAGNOSIS — G4733 Obstructive sleep apnea (adult) (pediatric): Secondary | ICD-10-CM | POA: Insufficient documentation

## 2023-02-03 DIAGNOSIS — R42 Dizziness and giddiness: Secondary | ICD-10-CM | POA: Insufficient documentation

## 2023-02-03 DIAGNOSIS — N200 Calculus of kidney: Secondary | ICD-10-CM | POA: Insufficient documentation

## 2023-02-03 DIAGNOSIS — I10 Essential (primary) hypertension: Secondary | ICD-10-CM | POA: Insufficient documentation

## 2023-02-03 DIAGNOSIS — R600 Localized edema: Secondary | ICD-10-CM | POA: Insufficient documentation

## 2023-02-03 DIAGNOSIS — M5030 Other cervical disc degeneration, unspecified cervical region: Secondary | ICD-10-CM | POA: Insufficient documentation

## 2023-02-03 DIAGNOSIS — I671 Cerebral aneurysm, nonruptured: Secondary | ICD-10-CM | POA: Insufficient documentation

## 2023-02-03 DIAGNOSIS — K219 Gastro-esophageal reflux disease without esophagitis: Secondary | ICD-10-CM | POA: Insufficient documentation

## 2023-02-03 DIAGNOSIS — E7800 Pure hypercholesterolemia, unspecified: Secondary | ICD-10-CM | POA: Insufficient documentation

## 2023-02-03 DIAGNOSIS — K469 Unspecified abdominal hernia without obstruction or gangrene: Secondary | ICD-10-CM | POA: Insufficient documentation

## 2023-02-08 ENCOUNTER — Ambulatory Visit (AMBULATORY_SURGERY_CENTER): Payer: 59

## 2023-02-08 VITALS — Ht 63.0 in | Wt 170.0 lb

## 2023-02-08 DIAGNOSIS — Z85038 Personal history of other malignant neoplasm of large intestine: Secondary | ICD-10-CM

## 2023-02-08 MED ORDER — ONDANSETRON HCL 4 MG PO TABS: 4.0000 mg | ORAL_TABLET | Freq: Three times a day (TID) | ORAL | 0 refills | Status: DC | PRN

## 2023-02-08 MED ORDER — NA SULFATE-K SULFATE-MG SULF 17.5-3.13-1.6 GM/177ML PO SOLN: 1.0000 | Freq: Once | ORAL | 0 refills | Status: AC

## 2023-02-08 NOTE — Addendum Note (Signed)
Addended by: Danton Sewer on: 02/08/2023 09:39 AM   Modules accepted: Orders

## 2023-02-08 NOTE — Progress Notes (Addendum)
Pt's name and DOB verified at the beginning of the pre-visit wit 2 identifiers  Pt denies any difficulty with ambulating,sitting, laying down or rolling side to side  Gave both [facility] main # and MD on call # prior to instructions.   No egg or soy allergy known to patient   No issues known to pt with past sedation with any surgeries or procedures  Pt denies having issues being intubated  Pt has no issues moving head neck or swallowing  No FH of Malignant Hyperthermia  Pt is not on diet pills or shots  Pt is not on home 02   Pt is not on blood thinners   Pt has frequent issues with constipation RN instructed pt to use Miralax per bottles instructions a week before prep days. Pt states they will  Pt is not on dialysis  Pt denise any abnormal heart rhythms   Pt denies any upcoming cardiac testing  Pt encouraged to use to use Singlecare or Goodrx to reduce cost   Patient's chart reviewed by Cathlyn Parsons CNRA prior to pre-visit and patient appropriate for the [facility].  Pre-visit completed and red dot placed by patient's name on their procedure day (on provider's schedule).  .  Visit by phone   weight170 lb  Instructed pt why it is important to and  to call if they have any changes in health or new medications. Directed them to the # given and on instructions.     Instructions reviewed with pt and pt states understanding. Instructed to review again prior to procedure Instructions sent by mail  and by my chart  Coupon sent via text to mobile # Pt stated she had issues with nausea doing prep last colonoscopy so Aofran ordered and instructions on use given

## 2023-02-15 ENCOUNTER — Telehealth: Payer: Self-pay

## 2023-02-15 NOTE — Telephone Encounter (Signed)
Called and spoke with patient concerning need to change procedure date due to staffing; patient agreeable to change and has been sent updated prep instructions through MyChart per her request; Patient verbalized understanding of information/instructions;   Patient advised to call back to the office at [phone] should questions/concerns arise;

## 2023-02-22 ENCOUNTER — Other Ambulatory Visit: Payer: Self-pay | Admitting: Family Medicine

## 2023-02-22 MED ORDER — TRAMADOL HCL 50 MG PO TABS: ORAL_TABLET | ORAL | 0 refills | Status: AC

## 2023-02-22 NOTE — Telephone Encounter (Signed)
Patient called in and would like to know why her refill request was denied for this medication?

## 2023-02-22 NOTE — Telephone Encounter (Signed)
Name of Medication: Tramadol Name of [facility]acy: [facility] Last Fill or Written Date and Quantity: 01/23/23 #60 tab/ 0 refills  Last Office Visit and Type: CPE 12/02/22 Next Office Visit and Type: none scheduled

## 2023-02-22 NOTE — Telephone Encounter (Signed)
Did not see where it was denied?

## 2023-02-23 NOTE — Telephone Encounter (Signed)
Rx was not denied, they just sent 2 separate refill request for the same med. One had to be denied and one approved.

## 2023-02-24 ENCOUNTER — Encounter: Payer: 59 | Admitting: Gastroenterology

## 2023-03-13 ENCOUNTER — Ambulatory Visit: Payer: 59 | Admitting: Gastroenterology

## 2023-03-13 ENCOUNTER — Encounter: Payer: Self-pay | Admitting: Gastroenterology

## 2023-03-13 VITALS — BP 147/79 | HR 69 | Temp 97.3°F | Resp 8 | Ht 63.0 in | Wt 170.0 lb

## 2023-03-13 DIAGNOSIS — D128 Benign neoplasm of rectum: Secondary | ICD-10-CM

## 2023-03-13 DIAGNOSIS — Z860101 Personal history of adenomatous and serrated colon polyps: Secondary | ICD-10-CM | POA: Diagnosis not present

## 2023-03-13 DIAGNOSIS — D123 Benign neoplasm of transverse colon: Secondary | ICD-10-CM

## 2023-03-13 DIAGNOSIS — Z1211 Encounter for screening for malignant neoplasm of colon: Secondary | ICD-10-CM | POA: Diagnosis present

## 2023-03-13 DIAGNOSIS — D120 Benign neoplasm of cecum: Secondary | ICD-10-CM

## 2023-03-13 DIAGNOSIS — K635 Polyp of colon: Secondary | ICD-10-CM | POA: Diagnosis not present

## 2023-03-13 MED ORDER — SODIUM CHLORIDE 0.9 % IV SOLN: 500.0000 mL | Freq: Once | INTRAVENOUS | Status: AC

## 2023-03-13 NOTE — Progress Notes (Signed)
History & Physical  Primary Care Physician:  Tower, Audrie Gallus, MD Primary Gastroenterologist: Claudette Head, MD  Impression / Plan:  Personal history of adenomatous colon polyps for surveillance colonoscopy.  CHIEF COMPLAINT:  Personal history of colon polyps   HPI: [name] is a 57 y.o. female with a personal history of adenomatous colon polyps for surveillance colonoscopy.    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   Anxiety    GERD (gastroesophageal reflux disease)    Hiatal hernia    HPV in female    also some abnormal paps in past   Hypertension    IBS (irritable bowel syndrome)    Kidney stones    PPD positive    Hx of   Renal lithiasis 2013   Sickle cell trait (HCC)    Sleep apnea     Past Surgical History:  Procedure Laterality Date   APPENDECTOMY     1990's   BREAST EXCISIONAL BIOPSY Left 2014   BREAST SURGERY     removal of L breast mass / benign /age [age]   CESAREAN SECTION     x3   CYSTOSCOPY  2008   L RPG, stone extraction (Grapey)   ESOPHAGOGASTRODUODENOSCOPY  06/15/2012   Several----normal    FOOT SURGERY  10/2011   RIGHT   hysterectomy     2012   LAPAROSCOPIC VAGINAL HYSTERECTOMY  12/02/2010   menorrhagia, leiomyomata   LAPAROSCOPY N/A 05/24/2012   Procedure: LAPAROSCOPY OPERATIVE;  Surgeon: Dara Lords, MD;  Location: [facility];  Service: Gynecology;  Laterality: N/A;   PELVIC LAPAROSCOPY     x2 for cysts r/o endometriosis   SALPINGOOPHORECTOMY Right 05/24/2012   Procedure: SALPINGO OOPHORECTOMY;  Surgeon: Dara Lords, MD;  Location: [facility];  Service: Gynecology;  Laterality: Right;   TONSILLECTOMY AND ADENOIDECTOMY     age 31   TUBAL LIGATION     BTL    Prior to Admission medications   Medication Sig Start Date End Date Taking? Authorizing Provider  amLODipine (NORVASC) 5 MG tablet Take by mouth. 02/24/23  Yes [provider]  escitalopram  (LEXAPRO) 10 MG tablet Take by mouth. 10/11/22  Yes [provider]  estradiol (VIVELLE-DOT) 0.1 MG/24HR patch Place 1 patch (0.1 mg total) onto the skin 2 (two) times a week. 07/28/22  Yes Genia Del, MD  minoxidil (LONITEN) 2.5 MG tablet Take 1/2 tablet daily after dinner x 3 months supply 01/04/22  Yes [provider]  traMADol (ULTRAM) 50 MG tablet TAKE 1 TABLET(50 MG) BY MOUTH EVERY 12 HOURS AS NEEDED FOR SEVERE PAIN 02/22/23  Yes Tower, Audrie Gallus, MD  fluocinonide ointment (LIDEX) 0.05 % Apply directly to scalp 3 days per week 01/04/22   [provider]  linaclotide (LINZESS) 290 MCG CAPS capsule Take 1 capsule (290 mcg total) by mouth daily as needed. 11/29/21   Tower, Audrie Gallus, MD  metFORMIN (GLUCOPHAGE-XR) 500 MG 24 hr tablet Take by mouth. 11/25/22   [provider]  montelukast (SINGULAIR) 10 MG tablet TAKE 1 TABLET(10 MG) BY MOUTH AT BEDTIME 01/11/21   Tower, [place] A, MD  omeprazole (PRILOSEC) 40 MG capsule Take 40 mg by mouth daily as needed. 08/05/22   [provider]  rizatriptan (MAXALT) 5 MG tablet Take by mouth. 01/27/23   [provider]    Current Outpatient Medications  Medication Sig Dispense Refill  amLODipine (NORVASC) 5 MG tablet Take by mouth.     escitalopram (LEXAPRO) 10 MG tablet Take by mouth.     estradiol (VIVELLE-DOT) 0.1 MG/24HR patch Place 1 patch (0.1 mg total) onto the skin 2 (two) times a week. 24 patch 4   minoxidil (LONITEN) 2.5 MG tablet Take 1/2 tablet daily after dinner x 3 months supply     traMADol (ULTRAM) 50 MG tablet TAKE 1 TABLET(50 MG) BY MOUTH EVERY 12 HOURS AS NEEDED FOR SEVERE PAIN 60 tablet 0   fluocinonide ointment (LIDEX) 0.05 % Apply directly to scalp 3 days per week     linaclotide (LINZESS) 290 MCG CAPS capsule Take 1 capsule (290 mcg total) by mouth daily as needed. 90 capsule 1   metFORMIN (GLUCOPHAGE-XR) 500 MG 24 hr tablet Take by mouth.     montelukast (SINGULAIR) 10 MG tablet TAKE  1 TABLET(10 MG) BY MOUTH AT BEDTIME 90 tablet 2   omeprazole (PRILOSEC) 40 MG capsule Take 40 mg by mouth daily as needed.     rizatriptan (MAXALT) 5 MG tablet Take by mouth.     Current Facility-Administered Medications  Medication Dose Route Frequency Provider Last Rate Last Admin   0.9 %  sodium chloride infusion  500 mL Intravenous Once Meryl Dare, MD        Allergies as of 03/13/2023 - Review Complete 03/13/2023  Allergen Reaction Noted   Banana Itching 09/18/2012   Morphine Itching    Shellfish allergy Itching 11/25/2022   Sumatriptan Other (See Comments) 11/25/2022    Family History  Problem Relation Age of Onset   Anesthesia problems Mother    Hypertension Mother    Cancer Mother        Lymphoma   Aneurysm Father    Prostate cancer Brother    Breast cancer Maternal Aunt 76   Cancer Paternal Aunt        uterine CA   Lupus Cousin    Colon cancer Neg Hx    Esophageal cancer Neg Hx    Stomach cancer Neg Hx    Rectal cancer Neg Hx    Colon polyps Neg Hx     Social History   Socioeconomic History   Marital status: Married    Spouse name: Minerva Areola   Number of children: 3   Years of education: Automotive engineer   Highest education level: Not on file  Occupational History   Occupation: Water quality scientist: [employer]  Tobacco Use   Smoking status: Never   Smokeless tobacco: Never  Vaping Use   Vaping status: Never Used  Substance and Sexual Activity   Alcohol use: Yes    Alcohol/week: 1.0 - 2.0 standard drink of alcohol    Types: 1 - 2 Glasses of wine per week   Drug use: No   Sexual activity: Yes    Partners: Male    Birth control/protection: Surgical    Comment: 1st intercourse 57 yo-Fewer than 5 partners, MARRIED- 2 YRS  Other Topics Concern   Not on file  Social History Narrative   Pt lives at home with her spouse.   Caffeine Use: 1-2 cups daily; sodas occasionally   Social Determinants of Health   Financial Resource Strain: Not on file  Food  Insecurity: Not on file  Transportation Needs: Not on file  Physical Activity: Not on file  Stress: Not on file  Social Connections: Not on file  Intimate Partner Violence: Not on file    Review  of Systems:  All systems reviewed were negative except where noted in HPI.   Physical Exam:  General:  Alert, well-developed, in NAD Head:  Normocephalic and atraumatic. Eyes:  Sclera clear, no icterus.   Conjunctiva pink. Ears:  Normal auditory acuity. Mouth:  No deformity or lesions.  Neck:  Supple; no masses. Lungs:  Clear throughout to auscultation.   No wheezes, crackles, or rhonchi.  Heart:  Regular rate and rhythm; no murmurs. Abdomen:  Soft, nondistended, nontender. No masses, hepatomegaly. No palpable masses.  Normal bowel sounds.    Rectal:  Deferred   Msk:  Symmetrical without gross deformities. Extremities:  Without edema. Neurologic:  Alert and  oriented x 4; grossly normal neurologically. Skin:  Intact without significant lesions or rashes. Psych:  Alert and cooperative. Normal mood and affect.   Venita Lick. Russella Dar  03/13/2023, 3:48 PM See Loretha Stapler, [facility], to contact our on call provider

## 2023-03-13 NOTE — Op Note (Signed)
[facility] Patient Name: [name] Procedure Date: 03/13/2023 3:49 PM MRN: [id number] Endoscopist: Meryl Dare , MD, [phone] Age: 57 Referring MD:  Date of Birth: [birth year] Gender: Female Account #: [id number] Procedure:                Colonoscopy Indications:              Surveillance: Personal history of adenomatous                            polyps on last colonoscopy 5 years ago Medicines:                Monitored Anesthesia Care Procedure:                Pre-Anesthesia Assessment:                           - Prior to the procedure, a History and Physical                            was performed, and patient medications and                            allergies were reviewed. The patient's tolerance of                            previous anesthesia was also reviewed. The risks                            and benefits of the procedure and the sedation                            options and risks were discussed with the patient.                            All questions were answered, and informed consent                            was obtained. Prior Anticoagulants: The patient has                            taken no anticoagulant or antiplatelet agents. ASA                            Grade Assessment: II - A patient with mild systemic                            disease. After reviewing the risks and benefits,                            the patient was deemed in satisfactory condition to                            undergo the procedure.  After obtaining informed consent, the colonoscope                            was passed under direct vision. Throughout the                            procedure, the patient's blood pressure, pulse, and                            oxygen saturations were monitored continuously. The                            Olympus Scope [id number] was introduced through the                            anus and advanced to  the the cecum, identified by                            appendiceal orifice and ileocecal valve. The                            ileocecal valve, appendiceal orifice, and rectum                            were photographed. The quality of the bowel                            preparation was adequate. The colonoscopy was                            performed without difficulty. The patient tolerated                            the procedure well. Scope In: 3:56:58 PM Scope Out: 4:20:20 PM Scope Withdrawal Time: 0 hours 16 minutes 49 seconds  Total Procedure Duration: 0 hours 23 minutes 22 seconds  Findings:                 The perianal and digital rectal examinations were                            normal.                           Six sessile polyps were found in the rectum (4),                            transverse colon (1) and cecum (1). The polyps were                            5 to 8 mm in size. These polyps were removed with a                            cold snare. Resection and retrieval were complete.  The exam was otherwise without abnormality on                            direct and retroflexion views. Complications:            No immediate complications. Estimated blood loss:                            None. Estimated Blood Loss:     Estimated blood loss: none. Impression:               - Six 5 to 8 mm polyps in the rectum, in the                            transverse colon and in the cecum, removed with a                            cold snare. Resected and retrieved.                           - The examination was otherwise normal on direct                            and retroflexion views. Recommendation:           - Repeat colonoscopy after studies are complete for                            surveillance based on pathology results.                           - Patient has a contact number available for                            emergencies. The signs  and symptoms of potential                            delayed complications were discussed with the                            patient. Return to normal activities tomorrow.                            Written discharge instructions were provided to the                            patient.                           - Resume previous diet.                           - Continue present medications.                           - Await pathology results. Meryl Dare, MD 03/13/2023 4:24:26 PM This  report has been signed electronically.

## 2023-03-13 NOTE — Progress Notes (Signed)
Called to room to assist during endoscopic procedure.  Patient ID and intended procedure confirmed with present staff. Received instructions for my participation in the procedure from the performing physician.  

## 2023-03-13 NOTE — Progress Notes (Signed)
Report to PACU, RN, vss, BBS= Clear.  

## 2023-03-13 NOTE — Patient Instructions (Signed)
 Resume all of your previous medications today as ordered.  Read your discharge instructions.  YOU HAD AN ENDOSCOPIC PROCEDURE TODAY AT THE [facility]:   Refer to the procedure report that was given to you for any specific questions about what was found during the examination.  If the procedure report does not answer your questions, please call your gastroenterologist to clarify.  If you requested that your care partner not be given the details of your procedure findings, then the procedure report has been included in a sealed envelope for you to review at your convenience later.  YOU SHOULD EXPECT: Some feelings of bloating in the abdomen. Passage of more gas than usual.  Walking can help get rid of the air that was put into your GI tract during the procedure and reduce the bloating. If you had a lower endoscopy (such as a colonoscopy or flexible sigmoidoscopy) you may notice spotting of blood in your stool or on the toilet paper. If you underwent a bowel prep for your procedure, you may not have a normal bowel movement for a few days.  Please Note:  You might notice some irritation and congestion in your nose or some drainage.  This is from the oxygen used during your procedure.  There is no need for concern and it should clear up in a day or so.  SYMPTOMS TO REPORT IMMEDIATELY:  Following lower endoscopy (colonoscopy or flexible sigmoidoscopy):  Excessive amounts of blood in the stool  Significant tenderness or worsening of abdominal pains  Swelling of the abdomen that is new, acute  Fever of 100F or higher  For urgent or emergent issues, a gastroenterologist can be reached at any hour by calling [phone]. Do not use MyChart messaging for urgent concerns.    DIET:  We do recommend a small meal at first, but then you may proceed to your regular diet.  Drink plenty of fluids but you should avoid alcoholic beverages for 24 hours.  ACTIVITY:  You should plan to take it easy  for the rest of today and you should NOT DRIVE or use heavy machinery until tomorrow (because of the sedation medicines used during the test).    FOLLOW UP: Our staff will call the number listed on your records the next business day following your procedure.  We will call around 7:15- 8:00 am to check on you and address any questions or concerns that you may have regarding the information given to you following your procedure. If we do not reach you, we will leave a message.     If any biopsies were taken you will be contacted by phone or by letter within the next 1-3 weeks.  Please call us at [phone] if you have not heard about the biopsies in 3 weeks.    SIGNATURES/CONFIDENTIALITY: You and/or your care partner have signed paperwork which will be entered into your electronic medical record.  These signatures attest to the fact that that the information above on your After Visit Summary has been reviewed and is understood.  Full responsibility of the confidentiality of this discharge information lies with you and/or your care-partner.

## 2023-03-13 NOTE — Progress Notes (Signed)
Patient took a long time to wake up.  Woken up by Shiela C.  Patient states headache.  Has history of bad headaches.  Pt want "somethng for pain."  Explained to patient that we have nothing for her headache.Told to take pain medication when she gets home.  Patient did have restless legs about half way into recovery.  Patient refuses to stop at the bathroom.  Passing gas well. Discharged at this time.  Kept eyes closed due to headache.  Transporter stated that pt got up quickly and went to the car without assistance.

## 2023-03-14 ENCOUNTER — Telehealth: Payer: Self-pay

## 2023-03-14 NOTE — Telephone Encounter (Signed)
  Follow up Call-     03/13/2023    3:09 PM  Call back number  Post procedure Call Back phone  # [phone]  Permission to leave phone message Yes     Patient questions:  Do you have a fever, pain , or abdominal swelling? No. Pain Score  0 *  Have you tolerated food without any problems? Yes.    Have you been able to return to your normal activities? Yes.    Do you have any questions about your discharge instructions: Diet   No. Medications  No. Follow up visit  No.  Do you have questions or concerns about your Care? No.  Actions: * If pain score is 4 or above: No action needed, pain <4.

## 2023-03-16 LAB — SURGICAL PATHOLOGY

## 2023-03-30 ENCOUNTER — Encounter: Payer: Self-pay | Admitting: Gastroenterology

## 2023-05-06 ENCOUNTER — Emergency Department (HOSPITAL_COMMUNITY)
Admission: EM | Admit: 2023-05-06 | Discharge: 2023-05-07 | Disposition: A | Discharge status: Home / Self Care | Payer: 59 | Attending: Emergency Medicine | Admitting: Emergency Medicine

## 2023-05-06 ENCOUNTER — Other Ambulatory Visit: Payer: Self-pay

## 2023-05-06 ENCOUNTER — Encounter (HOSPITAL_COMMUNITY): Payer: Self-pay

## 2023-05-06 ENCOUNTER — Emergency Department (HOSPITAL_COMMUNITY): Payer: 59

## 2023-05-06 DIAGNOSIS — K6289 Other specified diseases of anus and rectum: Secondary | ICD-10-CM

## 2023-05-06 DIAGNOSIS — K5120 Ulcerative (chronic) proctitis without complications: Secondary | ICD-10-CM | POA: Insufficient documentation

## 2023-05-06 DIAGNOSIS — R1084 Generalized abdominal pain: Secondary | ICD-10-CM | POA: Diagnosis present

## 2023-05-06 DIAGNOSIS — K5900 Constipation, unspecified: Secondary | ICD-10-CM | POA: Diagnosis not present

## 2023-05-06 LAB — CBC WITH DIFFERENTIAL/PLATELET
Abs Immature Granulocytes: 0.03 10*3/uL (ref 0.00–0.07)
Basophils Absolute: 0 10*3/uL (ref 0.0–0.1)
Basophils Relative: 0 %
Eosinophils Absolute: 0 10*3/uL (ref 0.0–0.5)
Eosinophils Relative: 0 %
HCT: 40.2 % (ref 36.0–46.0)
Hemoglobin: 13.4 g/dL (ref 12.0–15.0)
Immature Granulocytes: 0 %
Lymphocytes Relative: 12 %
Lymphs Abs: 1.4 10*3/uL (ref 0.7–4.0)
MCH: 26 pg (ref 26.0–34.0)
MCHC: 33.3 g/dL (ref 30.0–36.0)
MCV: 77.9 fL — ABNORMAL LOW (ref 80.0–100.0)
Monocytes Absolute: 0.6 10*3/uL (ref 0.1–1.0)
Monocytes Relative: 5 %
Neutro Abs: 9.7 10*3/uL — ABNORMAL HIGH (ref 1.7–7.7)
Neutrophils Relative %: 83 %
Platelets: 338 10*3/uL (ref 150–400)
RBC: 5.16 MIL/uL — ABNORMAL HIGH (ref 3.87–5.11)
RDW: 13.9 % (ref 11.5–15.5)
WBC: 11.7 10*3/uL — ABNORMAL HIGH (ref 4.0–10.5)
nRBC: 0 % (ref 0.0–0.2)

## 2023-05-06 LAB — COMPREHENSIVE METABOLIC PANEL
ALT: 21 U/L (ref 0–44)
AST: 22 U/L (ref 15–41)
Albumin: 4.4 g/dL (ref 3.5–5.0)
Alkaline Phosphatase: 60 U/L (ref 38–126)
Anion gap: 11 (ref 5–15)
BUN: 11 mg/dL (ref 6–20)
CO2: 23 mmol/L (ref 22–32)
Calcium: 9.2 mg/dL (ref 8.9–10.3)
Chloride: 103 mmol/L (ref 98–111)
Creatinine, Ser: 0.78 mg/dL (ref 0.44–1.00)
GFR, Estimated: >60 mL/min (ref >60)
Glucose, Bld: 123 mg/dL — ABNORMAL HIGH (ref 70–99)
Potassium: 4 mmol/L (ref 3.5–5.1)
Sodium: 137 mmol/L (ref 135–145)
Total Bilirubin: 0.4 mg/dL (ref 0.0–1.2)
Total Protein: 7.2 g/dL (ref 6.5–8.1)

## 2023-05-06 LAB — URINALYSIS, ROUTINE W REFLEX MICROSCOPIC
Bacteria, UA: NONE SEEN
Bilirubin Urine: NEGATIVE
Glucose, UA: NEGATIVE mg/dL
Ketones, ur: NEGATIVE mg/dL
Leukocytes,Ua: NEGATIVE
Nitrite: NEGATIVE
Protein, ur: NEGATIVE mg/dL
Specific Gravity, Urine: 1.042 — ABNORMAL HIGH (ref 1.005–1.030)
pH: 7 (ref 5.0–8.0)

## 2023-05-06 LAB — LIPASE, BLOOD: Lipase: 22 U/L (ref 11–51)

## 2023-05-06 MED ORDER — IOHEXOL 300 MG/ML  SOLN
100.0000 mL | Freq: Once | INTRAMUSCULAR | Status: AC | PRN
  Administered 2023-05-06: 100 mL via INTRAVENOUS

## 2023-05-06 MED ORDER — FENTANYL CITRATE PF 50 MCG/ML IJ SOSY
50.0000 ug | PREFILLED_SYRINGE | Freq: Once | INTRAMUSCULAR | Status: AC
  Administered 2023-05-06: 50 ug via INTRAVENOUS
  Filled 2023-05-06: qty 1

## 2023-05-06 MED ORDER — DICYCLOMINE HCL 10 MG/ML IM SOLN
20.0000 mg | Freq: Once | INTRAMUSCULAR | Status: AC
  Administered 2023-05-06: 20 mg via INTRAMUSCULAR
  Filled 2023-05-06: qty 2

## 2023-05-06 MED ORDER — LACTULOSE 10 GM/15ML PO SOLN
30.0000 g | Freq: Once | ORAL | Status: AC
  Administered 2023-05-06: 30 g via ORAL
  Filled 2023-05-06: qty 60

## 2023-05-06 MED ORDER — AMOXICILLIN-POT CLAVULANATE 875-125 MG PO TABS
1.0000 | ORAL_TABLET | Freq: Once | ORAL | Status: AC
  Administered 2023-05-06: 1 via ORAL
  Filled 2023-05-06: qty 1

## 2023-05-06 MED ORDER — ONDANSETRON HCL 4 MG/2ML IJ SOLN
4.0000 mg | Freq: Once | INTRAMUSCULAR | Status: AC
  Administered 2023-05-06: 4 mg via INTRAVENOUS
  Filled 2023-05-06: qty 2

## 2023-05-06 NOTE — ED Triage Notes (Signed)
BIB EMS from home for abdominal pain and constipation. Pt is unsure when the last time she had a BM. Pt tried enema, magnesium citrate at home with no relief.

## 2023-05-06 NOTE — ED Provider Notes (Signed)
EMERGENCY DEPARTMENT AT [facility] Provider Note   CSN: [id number] Arrival date & time: 05/06/23  1323     History {Add pertinent medical, surgical, social history, OB history to HPI:1} Chief Complaint  Patient presents with   Abdominal Pain   Constipation    [name] is a 58 y.o. female.  Patient presents to the emergency department for evaluation of abdominal pain.  Patient reports that she has not had a bowel movement in some time.  She feels constipated, this morning started having diffuse pain in her back and abdomen.  She has tried everything she can do including magnesium citrate, fleets enema, suppositories without any relief of her pain or bowel movement.       Home Medications Prior to Admission medications   Medication Sig Start Date End Date Taking? Authorizing Provider  amLODipine (NORVASC) 5 MG tablet Take by mouth. 02/24/23   [provider]  escitalopram (LEXAPRO) 10 MG tablet Take by mouth. 10/11/22   [provider]  estradiol (VIVELLE-DOT) 0.1 MG/24HR patch Place 1 patch (0.1 mg total) onto the skin 2 (two) times a week. 07/28/22   Genia Del, MD  fluocinonide ointment (LIDEX) 0.05 % Apply directly to scalp 3 days per week 01/04/22   [provider]  linaclotide (LINZESS) 290 MCG CAPS capsule Take 1 capsule (290 mcg total) by mouth daily as needed. 11/29/21   Tower, Audrie Gallus, MD  metFORMIN (GLUCOPHAGE-XR) 500 MG 24 hr tablet Take by mouth. 11/25/22   [provider]  minoxidil (LONITEN) 2.5 MG tablet Take 1/2 tablet daily after dinner x 3 months supply 01/04/22   [provider]  montelukast (SINGULAIR) 10 MG tablet TAKE 1 TABLET(10 MG) BY MOUTH AT BEDTIME 01/11/21   Tower, Winona A, MD  omeprazole (PRILOSEC) 40 MG capsule Take 40 mg by mouth daily as needed. 08/05/22   [provider]  rizatriptan (MAXALT) 5 MG tablet Take by mouth. 01/27/23   [provider]  traMADol  (ULTRAM) 50 MG tablet TAKE 1 TABLET(50 MG) BY MOUTH EVERY 12 HOURS AS NEEDED FOR SEVERE PAIN 02/22/23   Tower, Audrie Gallus, MD      Allergies    Banana, Morphine, Shellfish allergy, and Sumatriptan    Review of Systems   Review of Systems  Physical Exam Updated Vital Signs BP (!) 168/85 (BP Location: Left Arm)   Pulse 86   Temp 98.1 F (36.7 C) (Oral)   Resp 20   Ht 5\' 3"  (1.6 m)   Wt 78.9 kg   LMP 11/24/2010 Comment: sexually active  SpO2 96%   BMI 30.82 kg/m  Physical Exam Vitals and nursing note reviewed.  Constitutional:      General: She is not in acute distress.    Appearance: She is well-developed.  HENT:     Head: Normocephalic and atraumatic.     Mouth/Throat:     Mouth: Mucous membranes are moist.  Eyes:     General: Vision grossly intact. Gaze aligned appropriately.     Extraocular Movements: Extraocular movements intact.     Conjunctiva/sclera: Conjunctivae normal.  Cardiovascular:     Rate and Rhythm: Normal rate and regular rhythm.     Pulses: Normal pulses.     Heart sounds: Normal heart sounds, S1 normal and S2 normal. No murmur heard.    No friction rub. No gallop.  Pulmonary:     Effort: Pulmonary effort is normal. No respiratory distress.     Breath  sounds: Normal breath sounds.  Abdominal:     General: Bowel sounds are normal. There is distension.     Palpations: Abdomen is soft.     Tenderness: There is generalized abdominal tenderness. There is no guarding or rebound.     Hernia: No hernia is present.  Musculoskeletal:        General: No swelling.     Cervical back: Full passive range of motion without pain, normal range of motion and neck supple. No spinous process tenderness or muscular tenderness. Normal range of motion.     Right lower leg: No edema.     Left lower leg: No edema.  Skin:    General: Skin is warm and dry.     Capillary Refill: Capillary refill takes less than 2 seconds.     Findings: No ecchymosis, erythema, rash or wound.   Neurological:     General: No focal deficit present.     Mental Status: She is alert and oriented to person, place, and time.     GCS: GCS eye subscore is 4. GCS verbal subscore is 5. GCS motor subscore is 6.     Cranial Nerves: Cranial nerves 2-12 are intact.     Sensory: Sensation is intact.     Motor: Motor function is intact.     Coordination: Coordination is intact.  Psychiatric:        Attention and Perception: Attention normal.        Mood and Affect: Mood normal.        Speech: Speech normal.        Behavior: Behavior normal.     ED Results / Procedures / Treatments   Labs (all labs ordered are listed, but only abnormal results are displayed) Labs Reviewed  COMPREHENSIVE METABOLIC PANEL - Abnormal; Notable for the following components:      Result Value   Glucose, Bld 123 (*)    All other components within normal limits  CBC WITH DIFFERENTIAL/PLATELET - Abnormal; Notable for the following components:   WBC 11.7 (*)    RBC 5.16 (*)    MCV 77.9 (*)    Neutro Abs 9.7 (*)    All other components within normal limits  URINALYSIS, ROUTINE W REFLEX MICROSCOPIC - Abnormal; Notable for the following components:   Color, Urine STRAW (*)    Specific Gravity, Urine 1.042 (*)    Hgb urine dipstick SMALL (*)    All other components within normal limits  LIPASE, BLOOD    EKG None  Radiology CT ABDOMEN PELVIS W CONTRAST Result Date: 05/06/2023 CLINICAL DATA:  Abdominal pain and constipation. EXAM: CT ABDOMEN AND PELVIS WITH CONTRAST TECHNIQUE: Multidetector CT imaging of the abdomen and pelvis was performed using the standard protocol following bolus administration of intravenous contrast. RADIATION DOSE REDUCTION: This exam was performed according to the departmental dose-optimization program which includes automated exposure control, adjustment of the mA and/or kV according to patient size and/or use of iterative reconstruction technique. CONTRAST:  OMNIPAQUE IOHEXOL 300  MG/ML  SOLN COMPARISON:  01/31/18 FINDINGS: Lower chest: Mild ground-glass attenuation in subsegmental atelectasis within the lateral left base. No pleural fluid or consolidative change. Hepatobiliary: No suspicious liver abnormality. Small cyst along the dome of liver is unchanged measuring 6 mm, image 16/2. Normal appearance of the gallbladder. No bile duct dilatation. Pancreas: Unremarkable. No pancreatic ductal dilatation or surrounding inflammatory changes. Spleen: Normal in size without focal abnormality. Adrenals/Urinary Tract: Normal adrenal glands. Small stone noted within the inferior  pole of the left kidney, image 39/2. No signs of suspicious mass or obstructive uropathy. Bladder appears normal. Stomach/Bowel: Stomach appears normal. There is no pathologic dilatation of the small bowel loops. No small bowel wall thickening or inflammation. Moderate retained stool is identified throughout the colon up to the rectum. There is mild wall thickening with mucosal enhancement involving the lower rectum and anal canal. No focal fluid collections to suggest abscess. Vascular/Lymphatic: Aortic atherosclerotic calcifications. No aneurysm. Upper abdominal vascularity appears patent. No signs of abdominopelvic adenopathy. Reproductive: Status post hysterectomy. No adnexal masses. Other: No ascites or focal fluid collections. No signs of pneumoperitoneum. Musculoskeletal: No acute or significant osseous findings. IMPRESSION: 1. Moderate retained stool is identified throughout the colon up to the rectum. 2. Mild wall thickening with mucosal enhancement involving the lower rectum and anal canal. Correlate for any signs or symptoms of proctitis. No signs of abscess. 3. Nonobstructing left renal calculus. 4.  Aortic Atherosclerosis (ICD10-I70.0). Electronically Signed   By: Signa Kell M.D.   On: 05/06/2023 17:02    Procedures Procedures  {Document cardiac monitor, telemetry assessment procedure when  appropriate:1}  Medications Ordered in ED Medications  dicyclomine (BENTYL) injection 20 mg (20 mg Intramuscular Given 05/06/23 1430)  iohexol (OMNIPAQUE) 300 MG/ML solution 100 mL (100 mLs Intravenous Contrast Given 05/06/23 1629)    ED Course/ Medical Decision Making/ A&P   {   Click here for ABCD2, HEART and other calculatorsREFRESH Note before signing :1}                              Medical Decision Making  Differential Diagnosis considered includes, but not limited to: Cholelithiasis; cholecystitis; cholangitis; bowel obstruction; esophagitis; gastritis; peptic ulcer disease; pancreatitis; cardiac.  Presents with constipation and diffuse abdominal pain.  Patient's lab work unrevealing.  She had a CT scan that shows diffuse increased stool burden and possible proctitis.  {Document critical care time when appropriate:1} {Document review of labs and clinical decision tools ie heart score, Chads2Vasc2 etc:1}  {Document your independent review of radiology images, and any outside records:1} {Document your discussion with family members, caretakers, and with consultants:1} {Document social determinants of health affecting pt's care:1} {Document your decision making why or why not admission, treatments were needed:1} Final Clinical Impression(s) / ED Diagnoses Final diagnoses:  None    Rx / DC Orders ED Discharge Orders     None

## 2023-05-07 MED ORDER — AMOXICILLIN-POT CLAVULANATE 875-125 MG PO TABS: 1.0000 | ORAL_TABLET | Freq: Two times a day (BID) | ORAL | 0 refills | Status: AC

## 2023-05-07 MED ORDER — LACTULOSE 10 GM/15ML PO SOLN: 10.0000 g | Freq: Two times a day (BID) | ORAL | 0 refills | Status: AC | PRN

## 2023-05-12 ENCOUNTER — Telehealth: Payer: Self-pay

## 2023-05-12 NOTE — Transitions of Care (Post Inpatient/ED Visit) (Signed)
 pt was seen [facility] on 05/07/23;with constipation and proctitis; pt did receive enema at ED with relief of pain. pt is doing well now. pt still taking Augmentin  875-125 mg q 12 h for proctitis and has Lactulose  10 g to use bid prn if constipation. Pt wants to wait to finish abx and then if needed pt will call [facility] for appt. UC & ED precautions also given. Sending to Dr Randeen.      05/12/2023  Name: Legaci Tarman MRN: [id number] DOB: Oct 05, 1965  Today's TOC FU Call Status: Today's TOC FU Call Status:: Successful TOC FU Call Completed TOC FU Call Complete Date: 05/12/23 Patient's Name and Date of Birth confirmed.  Transition Care Management Follow-up Telephone Call Date of Discharge: 05/07/23 Discharge Facility: [facility]) Type of Discharge: Emergency Department Reason for ED Visit: Other: (pt was seen [facility] on 05/07/23;with constipation and proctitis; pt did receive enema at ED with relief of pain. pt is doing well now. pt still taking Augmentin  875-125 mg q 12 h for proctitis and has Lactulose  10 g to use bid prn if constipation.) How have you been since you were released from the hospital?: Better Any questions or concerns?: No  Items Reviewed: Did you receive and understand the discharge instructions provided?: Yes Medications obtained,verified, and reconciled?: Yes (Medications Reviewed) Any new allergies since your discharge?: No Dietary orders reviewed?: NA Do you have support at home?: Yes People in Home: spouse Name of Support/Comfort Primary Source: Camellia  Medications Reviewed Today: Medications Reviewed Today   Medications were not reviewed in this encounter     Home Care and Equipment/Supplies: Were Home Health Services Ordered?: NA Any new equipment or medical supplies ordered?: NA  Functional Questionnaire: Do you need assistance with bathing/showering or dressing?: No Do you need assistance with meal preparation?: No Do you need assistance  with eating?: No Do you have difficulty maintaining continence: No Do you need assistance with getting out of bed/getting out of a chair/moving?: No Do you have difficulty managing or taking your medications?: No  Follow up appointments reviewed: PCP Follow-up appointment confirmed?: NA (pt said wants to finish abx and see how she felt before scheduling appt with PCP. pt said she would cb if appt needed.) Specialist Hospital Follow-up appointment confirmed?: NA Do you need transportation to your follow-up appointment?: No Do you understand care options if your condition(s) worsen?: Yes-patient verbalized understanding    SIGNATURE Laray Arenas, LPN
